# Patient Record
Sex: Male | Born: 1955 | Race: Black or African American | Hispanic: No | Marital: Married | Smoking: Never smoker
Health system: Southern US, Community
[De-identification: ages and names within clinical notes are randomized; demographics above are authoritative.]

## PROBLEM LIST (undated history)

## (undated) DIAGNOSIS — I10 Essential (primary) hypertension: Secondary | ICD-10-CM

## (undated) DIAGNOSIS — J45909 Unspecified asthma, uncomplicated: Secondary | ICD-10-CM

## (undated) DIAGNOSIS — C801 Malignant (primary) neoplasm, unspecified: Secondary | ICD-10-CM

## (undated) DIAGNOSIS — Z973 Presence of spectacles and contact lenses: Secondary | ICD-10-CM

## (undated) DIAGNOSIS — M2021 Hallux rigidus, right foot: Secondary | ICD-10-CM

## (undated) DIAGNOSIS — M1990 Unspecified osteoarthritis, unspecified site: Secondary | ICD-10-CM

## (undated) HISTORY — PX: KNEE SURGERY: SHX244

---

## 1998-05-27 ENCOUNTER — Emergency Department (HOSPITAL_COMMUNITY): Admission: EM | Admit: 1998-05-27 | Discharge: 1998-05-27 | Payer: Self-pay | Admitting: Emergency Medicine

## 2004-06-08 ENCOUNTER — Ambulatory Visit: Payer: Self-pay | Admitting: Internal Medicine

## 2004-11-25 ENCOUNTER — Ambulatory Visit (HOSPITAL_BASED_OUTPATIENT_CLINIC_OR_DEPARTMENT_OTHER): Admission: RE | Admit: 2004-11-25 | Discharge: 2004-11-25 | Payer: Self-pay | Admitting: Specialist

## 2004-11-25 ENCOUNTER — Ambulatory Visit (HOSPITAL_COMMUNITY): Admission: RE | Admit: 2004-11-25 | Discharge: 2004-11-25 | Payer: Self-pay | Admitting: Specialist

## 2005-11-07 ENCOUNTER — Emergency Department (HOSPITAL_COMMUNITY): Admission: EM | Admit: 2005-11-07 | Discharge: 2005-11-07 | Payer: Self-pay | Admitting: Emergency Medicine

## 2006-09-20 ENCOUNTER — Ambulatory Visit: Payer: Self-pay | Admitting: Gastroenterology

## 2006-11-01 ENCOUNTER — Ambulatory Visit: Payer: Self-pay | Admitting: Internal Medicine

## 2006-11-01 LAB — CONVERTED CEMR LAB
ALT: 32 units/L (ref 0–40)
AST: 29 units/L (ref 0–37)
BUN: 9 mg/dL (ref 6–23)
Bilirubin Urine: NEGATIVE
CO2: 28 meq/L (ref 19–32)
Calcium: 9.2 mg/dL (ref 8.4–10.5)
Cholesterol: 147 mg/dL (ref 0–200)
Creatinine, Ser: 1.2 mg/dL (ref 0.4–1.5)
Direct LDL: 68.8 mg/dL
HCT: 43.4 % (ref 39.0–52.0)
Ketones, ur: NEGATIVE mg/dL
MCHC: 34.2 g/dL (ref 30.0–36.0)
MCV: 88.8 fL (ref 78.0–100.0)
Monocytes Absolute: 0.4 10*3/uL (ref 0.2–0.7)
Monocytes Relative: 6.9 % (ref 3.0–11.0)
PSA: 0.89 ng/mL (ref 0.10–4.00)
RBC: 4.88 M/uL (ref 4.22–5.81)
Sodium: 140 meq/L (ref 135–145)
Specific Gravity, Urine: >=1.03 (ref 1.000–1.03)
Total CHOL/HDL Ratio: 5.6
Total Protein: 7 g/dL (ref 6.0–8.3)
Triglycerides: 246 mg/dL (ref 0–149)
Urobilinogen, UA: 0.2 (ref 0.0–1.0)
WBC: 6.4 10*3/uL (ref 4.5–10.5)

## 2007-05-19 ENCOUNTER — Ambulatory Visit: Payer: Self-pay | Admitting: Internal Medicine

## 2007-05-19 DIAGNOSIS — F3289 Other specified depressive episodes: Secondary | ICD-10-CM | POA: Insufficient documentation

## 2007-05-19 DIAGNOSIS — J45909 Unspecified asthma, uncomplicated: Secondary | ICD-10-CM | POA: Insufficient documentation

## 2007-05-19 DIAGNOSIS — J309 Allergic rhinitis, unspecified: Secondary | ICD-10-CM | POA: Insufficient documentation

## 2007-05-19 DIAGNOSIS — F329 Major depressive disorder, single episode, unspecified: Secondary | ICD-10-CM | POA: Insufficient documentation

## 2007-05-19 DIAGNOSIS — M25579 Pain in unspecified ankle and joints of unspecified foot: Secondary | ICD-10-CM | POA: Insufficient documentation

## 2010-10-09 NOTE — Op Note (Signed)
NAMEJenny Ray         ACCOUNT NO.:  [id number]   MEDICAL RECORD NO.:  [id number]          PATIENT TYPE:  AMB   LOCATION:  [facility]                         FACILITY:  [facility]   PHYSICIAN:  Jene Every, M.D.    DATE OF BIRTH:  1955-07-24   DATE OF PROCEDURE:  11/25/2004  DATE OF DISCHARGE:                                 OPERATIVE REPORT   PREOPERATIVE DIAGNOSIS:  Medial meniscus tear of the right knee.   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   PROCEDURE PERFORMED:  Right knee arthroscopy, posterior medial meniscectomy,  chondroplasty of the medial femoral condyle, medial tibial plateau, femoral  sulcus, patella, partial medial meniscectomy, evacuation of loose bodies.   ANESTHESIA:  General.   ASSISTANT:  None.   BRIEF HISTORY/INDICATIONS:  This is a 55 year old with knee pain and  refractory pain and swelling with MRI indicating the medial meniscus tear,  degenerative changes of the medial and patellofemoral compartments.  Operative intervention is indicated for debridement and partial medial  meniscectomy to the stable base.   Risks and benefits were discussed, including bleeding, infection, damage to  vascular structures, no changes in symptoms, worsening symptoms, need for  repeat debridement, etc.   TECHNIQUE:  With the patient in the supine position, after the induction of  adequate general anesthesia and 1 gm of Kefzol, the right knee and lower  extremity is prepped and draped in the usual sterile fashion.  A lateral  parapatellar portal and superomedial parapatellar portal was fashioned with  a #11 blade.  Then the __________ cannulated.  Irrigant was utilized to  insufflate the joint.  Camera inserted laterally.  Examination of the  medial  compartment revealed extensive chondral flap tears of the medial femoral  condyle.  A small grade 4 lesion of the tibial plateau laterally underneath  the meniscus.  A complex tear of the posterior third of the medial meniscus.  Unstable to probe/palpation.  Under direct visualization, the medial  parapatellar portal was fashioned with a #11 blade after localization with  an 18 gauge needle, sparing the medial meniscus.  A basket rongeur was  introduced and utilized to perform a partial medial meniscectomy with a  stable base, which was further contoured with a 4.2 coude shaver.  Chondroplasty at the femoral condyle and tibial plateau were performed.  I  evacuated loose bodies from the medial compartment.  A small grade 4 lesion  was approximately 4 mm in length and underneath the meniscus.  It was felt  best to leave this intact.  ACL and PCL were unremarkable.  There was  extensive grade 3 changes of the sulcus.  Some minor changes of the inferior  pole of the patella.  Chondroplasties were performed here as well.  The  lateral compartment was relatively spared with grade 2 changes in the  femoral condyle and tibial plateau.  The meniscus was stable to probe  palpation without evidence of  tear.  Again, ACL and PCL were without  evidence of tear.  The knee was copiously lavaged and examined the gutters.  All loose cartilaginous debris was removed.  Revised the medial meniscus.  No displaced fragment noted.   Next, all instrumentation was removed.  Portals were closed with 4-0 nylon  simple sutures.  Marcaine 0.25% with epinephrine was infiltrated into the  joint.  The wound was dressed sterilely.  He was awakened without difficulty  and transported to the recovery room in satisfactory condition.   The patient tolerated the procedure well with no complications.       JB/MEDQ  D:  11/25/2004  T:  11/25/2004  Job:  [id number]

## 2013-04-13 ENCOUNTER — Ambulatory Visit: Payer: Self-pay | Admitting: Internal Medicine

## 2014-11-05 ENCOUNTER — Encounter (HOSPITAL_COMMUNITY): Payer: Self-pay

## 2014-11-05 ENCOUNTER — Emergency Department (HOSPITAL_COMMUNITY)
Admission: EM | Admit: 2014-11-05 | Discharge: 2014-11-05 | Disposition: A | Discharge status: Home / Self Care | Payer: BLUE CROSS/BLUE SHIELD | Attending: Emergency Medicine | Admitting: Emergency Medicine

## 2014-11-05 ENCOUNTER — Emergency Department (HOSPITAL_COMMUNITY): Payer: BLUE CROSS/BLUE SHIELD

## 2014-11-05 DIAGNOSIS — Y92524 Gas station as the place of occurrence of the external cause: Secondary | ICD-10-CM | POA: Diagnosis not present

## 2014-11-05 DIAGNOSIS — Y9355 Activity, bike riding: Secondary | ICD-10-CM | POA: Insufficient documentation

## 2014-11-05 DIAGNOSIS — S99911A Unspecified injury of right ankle, initial encounter: Secondary | ICD-10-CM | POA: Diagnosis present

## 2014-11-05 DIAGNOSIS — S9304XA Dislocation of right ankle joint, initial encounter: Secondary | ICD-10-CM

## 2014-11-05 DIAGNOSIS — Y998 Other external cause status: Secondary | ICD-10-CM | POA: Diagnosis not present

## 2014-11-05 DIAGNOSIS — S82841A Displaced bimalleolar fracture of right lower leg, initial encounter for closed fracture: Secondary | ICD-10-CM | POA: Insufficient documentation

## 2014-11-05 DIAGNOSIS — S9301XA Subluxation of right ankle joint, initial encounter: Secondary | ICD-10-CM | POA: Insufficient documentation

## 2014-11-05 MED ORDER — OXYCODONE-ACETAMINOPHEN 5-325 MG PO TABS: 1.0000 | ORAL_TABLET | ORAL | Status: DC | PRN

## 2014-11-05 MED ORDER — ETOMIDATE 2 MG/ML IV SOLN
15.0000 mg | Freq: Once | INTRAVENOUS | Status: AC
  Administered 2014-11-05: 15 mg via INTRAVENOUS
  Filled 2014-11-05: qty 10

## 2014-11-05 MED ORDER — HYDROMORPHONE HCL 1 MG/ML IJ SOLN
INTRAMUSCULAR | Status: AC
  Filled 2014-11-05: qty 1

## 2014-11-05 MED ORDER — HYDROMORPHONE HCL 1 MG/ML IJ SOLN
1.0000 mg | Freq: Once | INTRAMUSCULAR | Status: AC
  Administered 2014-11-05: 1 mg via INTRAVENOUS

## 2014-11-05 NOTE — ED Notes (Signed)
Patient transported to X-ray 
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
## 2014-11-05 NOTE — ED Notes (Signed)
Questions r/t dc denied. Pt will transported via wheel chair. Pt a&ox4
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
## 2014-11-05 NOTE — ED Notes (Signed)
Conscious sedation completed on pt.

## 2014-11-05 NOTE — ED Notes (Signed)
MD at bedside. 
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
## 2014-11-05 NOTE — Discharge Instructions (Signed)
Return here as needed.  Follow-up with Dr. Avel Peace tomorrow by going to his office for a 2 PM appointment.  He said to arrive 15 minutes early
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
## 2014-11-06 ENCOUNTER — Telehealth (HOSPITAL_BASED_OUTPATIENT_CLINIC_OR_DEPARTMENT_OTHER): Payer: Self-pay | Admitting: Emergency Medicine

## 2014-11-06 ENCOUNTER — Ambulatory Visit: Payer: Self-pay | Admitting: Orthopedic Surgery

## 2014-11-06 MED ORDER — DEXTROSE 5 % IV SOLN
2.0000 g | INTRAVENOUS | Status: AC
  Administered 2014-11-08: 2 g via INTRAVENOUS

## 2014-11-07 ENCOUNTER — Encounter (HOSPITAL_COMMUNITY): Payer: Self-pay | Admitting: *Deleted

## 2014-11-07 DIAGNOSIS — Y92410 Unspecified street and highway as the place of occurrence of the external cause: Secondary | ICD-10-CM | POA: Diagnosis not present

## 2014-11-07 DIAGNOSIS — S82841A Displaced bimalleolar fracture of right lower leg, initial encounter for closed fracture: Secondary | ICD-10-CM | POA: Diagnosis present

## 2014-11-08 ENCOUNTER — Encounter (HOSPITAL_COMMUNITY)
Admission: RE | Disposition: A | Discharge status: Home / Self Care | Payer: Self-pay | Source: Ambulatory Visit | Attending: Specialist

## 2014-11-08 ENCOUNTER — Ambulatory Visit: Payer: Self-pay | Admitting: Orthopedic Surgery

## 2014-11-08 ENCOUNTER — Ambulatory Visit (HOSPITAL_COMMUNITY): Payer: BLUE CROSS/BLUE SHIELD | Admitting: Anesthesiology

## 2014-11-08 ENCOUNTER — Ambulatory Visit (HOSPITAL_COMMUNITY)
Admission: RE | Admit: 2014-11-08 | Discharge: 2014-11-09 | Disposition: A | Discharge status: Home / Self Care | Payer: BLUE CROSS/BLUE SHIELD | Source: Ambulatory Visit | Attending: Specialist | Admitting: Specialist

## 2014-11-08 ENCOUNTER — Ambulatory Visit (HOSPITAL_COMMUNITY): Payer: BLUE CROSS/BLUE SHIELD

## 2014-11-08 ENCOUNTER — Encounter (HOSPITAL_COMMUNITY): Payer: Self-pay | Admitting: *Deleted

## 2014-11-08 DIAGNOSIS — Y92410 Unspecified street and highway as the place of occurrence of the external cause: Secondary | ICD-10-CM | POA: Insufficient documentation

## 2014-11-08 DIAGNOSIS — S82843A Displaced bimalleolar fracture of unspecified lower leg, initial encounter for closed fracture: Secondary | ICD-10-CM | POA: Diagnosis present

## 2014-11-08 DIAGNOSIS — S82841A Displaced bimalleolar fracture of right lower leg, initial encounter for closed fracture: Secondary | ICD-10-CM | POA: Diagnosis not present

## 2014-11-08 DIAGNOSIS — S82899B Other fracture of unspecified lower leg, initial encounter for open fracture type I or II: Secondary | ICD-10-CM

## 2014-11-08 DIAGNOSIS — Z9889 Other specified postprocedural states: Secondary | ICD-10-CM

## 2014-11-08 DIAGNOSIS — Z8781 Personal history of (healed) traumatic fracture: Secondary | ICD-10-CM

## 2014-11-08 HISTORY — DX: Unspecified asthma, uncomplicated: J45.909

## 2014-11-08 HISTORY — PX: ORIF ANKLE FRACTURE: SHX5408

## 2014-11-08 LAB — CBC
HCT: 37.9 % — ABNORMAL LOW (ref 39.0–52.0)
HEMATOCRIT: 39.2 % (ref 39.0–52.0)
HEMOGLOBIN: 13.2 g/dL (ref 13.0–17.0)
HEMOGLOBIN: 12.7 g/dL — AB (ref 13.0–17.0)
MCH: 30.1 pg (ref 26.0–34.0)
MCH: 30.2 pg (ref 26.0–34.0)
MCHC: 33.7 g/dL (ref 30.0–36.0)
MCHC: 33.5 g/dL (ref 30.0–36.0)
MCV: 89.5 fL (ref 78.0–100.0)
MCV: 90.2 fL (ref 78.0–100.0)
Platelets: 214 10*3/uL (ref 150–400)
Platelets: 195 10*3/uL (ref 150–400)
RBC: 4.38 MIL/uL (ref 4.22–5.81)
RBC: 4.2 MIL/uL — ABNORMAL LOW (ref 4.22–5.81)
RDW: 13.6 % (ref 11.5–15.5)
RDW: 13.7 % (ref 11.5–15.5)
WBC: 6.2 10*3/uL (ref 4.0–10.5)
WBC: 8.1 10*3/uL (ref 4.0–10.5)

## 2014-11-08 LAB — CREATININE, SERUM
CREATININE: 1.31 mg/dL — AB (ref 0.61–1.24)
GFR calc Af Amer: >60 mL/min (ref >60)
GFR, EST NON AFRICAN AMERICAN: 59 mL/min — AB (ref >60)

## 2014-11-08 SURGERY — OPEN REDUCTION INTERNAL FIXATION (ORIF) ANKLE FRACTURE
Anesthesia: General | Site: Ankle | Laterality: Right

## 2014-11-08 MED ORDER — DOCUSATE SODIUM 100 MG PO CAPS
100.0000 mg | ORAL_CAPSULE | Freq: Two times a day (BID) | ORAL | Status: DC
  Administered 2014-11-09: 100 mg via ORAL

## 2014-11-08 MED ORDER — PROPOFOL 10 MG/ML IV BOLUS
INTRAVENOUS | Status: DC | PRN
  Administered 2014-11-08: 180 mg via INTRAVENOUS

## 2014-11-08 MED ORDER — CEFAZOLIN SODIUM-DEXTROSE 2-3 GM-% IV SOLR
INTRAVENOUS | Status: AC
  Filled 2014-11-08: qty 50

## 2014-11-08 MED ORDER — HYDROMORPHONE HCL 1 MG/ML IJ SOLN
INTRAMUSCULAR | Status: AC
  Filled 2014-11-08: qty 1

## 2014-11-08 MED ORDER — ACETAMINOPHEN 10 MG/ML IV SOLN: 1000.0000 mg | Freq: Once | INTRAVENOUS | Status: DC

## 2014-11-08 MED ORDER — CEFAZOLIN SODIUM-DEXTROSE 2-3 GM-% IV SOLR: 2.0000 g | INTRAVENOUS | Status: DC

## 2014-11-08 MED ORDER — MEPERIDINE HCL 50 MG/ML IJ SOLN: 6.2500 mg | INTRAMUSCULAR | Status: DC | PRN

## 2014-11-08 MED ORDER — LACTATED RINGERS IV SOLN
INTRAVENOUS | Status: DC
  Administered 2014-11-08: 1000 mL via INTRAVENOUS

## 2014-11-08 MED ORDER — ONDANSETRON HCL 4 MG/2ML IJ SOLN
INTRAMUSCULAR | Status: DC | PRN
  Administered 2014-11-08: 4 mg via INTRAVENOUS

## 2014-11-08 MED ORDER — ENOXAPARIN SODIUM 40 MG/0.4ML ~~LOC~~ SOLN
40.0000 mg | SUBCUTANEOUS | Status: DC
  Administered 2014-11-09: 40 mg via SUBCUTANEOUS
  Filled 2014-11-08 (×2): qty 0.4

## 2014-11-08 MED ORDER — LIDOCAINE HCL (CARDIAC) 20 MG/ML IV SOLN
INTRAVENOUS | Status: DC | PRN
  Administered 2014-11-08: 50 mg via INTRAVENOUS

## 2014-11-08 MED ORDER — DOCUSATE SODIUM 100 MG PO CAPS: 100.0000 mg | ORAL_CAPSULE | Freq: Two times a day (BID) | ORAL | Status: DC | PRN

## 2014-11-08 MED ORDER — SENNOSIDES-DOCUSATE SODIUM 8.6-50 MG PO TABS: 1.0000 | ORAL_TABLET | Freq: Every evening | ORAL | Status: DC | PRN

## 2014-11-08 MED ORDER — METHOCARBAMOL 500 MG PO TABS: 500.0000 mg | ORAL_TABLET | Freq: Four times a day (QID) | ORAL | Status: DC | PRN

## 2014-11-08 MED ORDER — BUPIVACAINE-EPINEPHRINE (PF) 0.5% -1:200000 IJ SOLN
INTRAMUSCULAR | Status: AC
  Filled 2014-11-08: qty 60

## 2014-11-08 MED ORDER — KETOROLAC TROMETHAMINE 30 MG/ML IJ SOLN
INTRAMUSCULAR | Status: AC
  Filled 2014-11-08: qty 1

## 2014-11-08 MED ORDER — RISAQUAD PO CAPS
1.0000 | ORAL_CAPSULE | Freq: Every day | ORAL | Status: DC
  Administered 2014-11-09: 1 via ORAL
  Filled 2014-11-08 (×2): qty 1

## 2014-11-08 MED ORDER — MIDAZOLAM HCL 5 MG/5ML IJ SOLN
INTRAMUSCULAR | Status: DC | PRN
  Administered 2014-11-08: 2 mg via INTRAVENOUS

## 2014-11-08 MED ORDER — FENTANYL CITRATE (PF) 100 MCG/2ML IJ SOLN
INTRAMUSCULAR | Status: AC
  Filled 2014-11-08: qty 2

## 2014-11-08 MED ORDER — BISACODYL 5 MG PO TBEC: 5.0000 mg | DELAYED_RELEASE_TABLET | Freq: Every day | ORAL | Status: DC | PRN

## 2014-11-08 MED ORDER — POTASSIUM CHLORIDE IN NACL 20-0.45 MEQ/L-% IV SOLN
INTRAVENOUS | Status: DC
  Administered 2014-11-08: 21:00:00 via INTRAVENOUS
  Filled 2014-11-08 (×2): qty 1000

## 2014-11-08 MED ORDER — SODIUM CHLORIDE 0.9 % IR SOLN
Status: DC | PRN
  Administered 2014-11-08: 500 mL

## 2014-11-08 MED ORDER — KETOROLAC TROMETHAMINE 10 MG PO TABS: 10.0000 mg | ORAL_TABLET | Freq: Four times a day (QID) | ORAL | Status: DC | PRN

## 2014-11-08 MED ORDER — HYDROMORPHONE HCL 1 MG/ML IJ SOLN
INTRAMUSCULAR | Status: AC
Start: 2014-11-08 — End: 2014-11-09
  Filled 2014-11-08: qty 1

## 2014-11-08 MED ORDER — ACETAMINOPHEN 325 MG PO TABS: 650.0000 mg | ORAL_TABLET | Freq: Four times a day (QID) | ORAL | Status: DC | PRN

## 2014-11-08 MED ORDER — MAGNESIUM CITRATE PO SOLN: 1.0000 | Freq: Once | ORAL | Status: AC | PRN

## 2014-11-08 MED ORDER — ONDANSETRON HCL 4 MG/2ML IJ SOLN: 4.0000 mg | Freq: Four times a day (QID) | INTRAMUSCULAR | Status: DC | PRN

## 2014-11-08 MED ORDER — ACETAMINOPHEN 10 MG/ML IV SOLN
INTRAVENOUS | Status: AC
  Filled 2014-11-08: qty 100

## 2014-11-08 MED ORDER — BUPIVACAINE-EPINEPHRINE (PF) 0.5% -1:200000 IJ SOLN
INTRAMUSCULAR | Status: DC | PRN
  Administered 2014-11-08: 4 mL

## 2014-11-08 MED ORDER — LIDOCAINE HCL (CARDIAC) 20 MG/ML IV SOLN
INTRAVENOUS | Status: AC
  Filled 2014-11-08: qty 5

## 2014-11-08 MED ORDER — PHENYLEPHRINE 40 MCG/ML (10ML) SYRINGE FOR IV PUSH (FOR BLOOD PRESSURE SUPPORT)
PREFILLED_SYRINGE | INTRAVENOUS | Status: AC
  Filled 2014-11-08: qty 10

## 2014-11-08 MED ORDER — ACETAMINOPHEN 650 MG RE SUPP: 650.0000 mg | Freq: Four times a day (QID) | RECTAL | Status: DC | PRN

## 2014-11-08 MED ORDER — BUPIVACAINE-EPINEPHRINE (PF) 0.5% -1:200000 IJ SOLN
INTRAMUSCULAR | Status: DC | PRN
  Administered 2014-11-08: 10 mL
  Administered 2014-11-08: 35 mL via PERINEURAL

## 2014-11-08 MED ORDER — METOCLOPRAMIDE HCL 5 MG/ML IJ SOLN: 5.0000 mg | Freq: Three times a day (TID) | INTRAMUSCULAR | Status: DC | PRN

## 2014-11-08 MED ORDER — BUPIVACAINE-EPINEPHRINE 0.5% -1:200000 IJ SOLN
INTRAMUSCULAR | Status: AC
  Filled 2014-11-08: qty 1

## 2014-11-08 MED ORDER — PROMETHAZINE HCL 25 MG/ML IJ SOLN: 6.2500 mg | INTRAMUSCULAR | Status: DC | PRN

## 2014-11-08 MED ORDER — FENTANYL CITRATE (PF) 100 MCG/2ML IJ SOLN
INTRAMUSCULAR | Status: DC | PRN
  Administered 2014-11-08: 100 ug via INTRAVENOUS
  Administered 2014-11-08: 25 ug via INTRAVENOUS
  Administered 2014-11-08: 50 ug via INTRAVENOUS
  Administered 2014-11-08: 25 ug via INTRAVENOUS

## 2014-11-08 MED ORDER — HYDROMORPHONE HCL 1 MG/ML IJ SOLN: 1.0000 mg | INTRAMUSCULAR | Status: DC | PRN

## 2014-11-08 MED ORDER — CEFAZOLIN SODIUM-DEXTROSE 2-3 GM-% IV SOLR
2.0000 g | Freq: Four times a day (QID) | INTRAVENOUS | Status: AC
  Administered 2014-11-08 – 2014-11-09 (×3): 2 g via INTRAVENOUS
  Filled 2014-11-08 (×3): qty 50

## 2014-11-08 MED ORDER — KETOROLAC TROMETHAMINE 15 MG/ML IJ SOLN
15.0000 mg | Freq: Four times a day (QID) | INTRAMUSCULAR | Status: DC
  Administered 2014-11-08 – 2014-11-09 (×2): 15 mg via INTRAVENOUS
  Filled 2014-11-08 (×5): qty 1

## 2014-11-08 MED ORDER — OXYCODONE HCL 5 MG PO TABS: 5.0000 mg | ORAL_TABLET | ORAL | Status: DC | PRN

## 2014-11-08 MED ORDER — MIDAZOLAM HCL 2 MG/2ML IJ SOLN: 0.5000 mg | Freq: Once | INTRAMUSCULAR | Status: DC | PRN

## 2014-11-08 MED ORDER — METHOCARBAMOL 1000 MG/10ML IJ SOLN
500.0000 mg | Freq: Four times a day (QID) | INTRAMUSCULAR | Status: DC | PRN
  Administered 2014-11-08: 500 mg via INTRAVENOUS
  Filled 2014-11-08 (×2): qty 5

## 2014-11-08 MED ORDER — BUPIVACAINE-EPINEPHRINE (PF) 0.5% -1:200000 IJ SOLN
INTRAMUSCULAR | Status: AC
  Filled 2014-11-08: qty 30

## 2014-11-08 MED ORDER — PROPOFOL 10 MG/ML IV BOLUS
INTRAVENOUS | Status: AC
  Filled 2014-11-08: qty 20

## 2014-11-08 MED ORDER — METOCLOPRAMIDE HCL 10 MG PO TABS: 5.0000 mg | ORAL_TABLET | Freq: Three times a day (TID) | ORAL | Status: DC | PRN

## 2014-11-08 MED ORDER — HYDROMORPHONE HCL 1 MG/ML IJ SOLN
0.2500 mg | INTRAMUSCULAR | Status: DC | PRN
  Administered 2014-11-08: 0.25 mg via INTRAVENOUS
  Administered 2014-11-08 (×2): 0.5 mg via INTRAVENOUS
  Administered 2014-11-08: 0.25 mg via INTRAVENOUS
  Administered 2014-11-08: 0.5 mg via INTRAVENOUS

## 2014-11-08 MED ORDER — ONDANSETRON HCL 4 MG/2ML IJ SOLN
INTRAMUSCULAR | Status: AC
  Filled 2014-11-08: qty 2

## 2014-11-08 MED ORDER — MIDAZOLAM HCL 2 MG/2ML IJ SOLN
INTRAMUSCULAR | Status: AC
  Filled 2014-11-08: qty 2

## 2014-11-08 MED ORDER — PHENYLEPHRINE HCL 10 MG/ML IJ SOLN
INTRAMUSCULAR | Status: DC | PRN
  Administered 2014-11-08: 40 ug via INTRAVENOUS

## 2014-11-08 MED ORDER — ONDANSETRON HCL 4 MG PO TABS: 4.0000 mg | ORAL_TABLET | Freq: Four times a day (QID) | ORAL | Status: DC | PRN

## 2014-11-08 MED ORDER — OXYCODONE-ACETAMINOPHEN 5-325 MG PO TABS: 1.0000 | ORAL_TABLET | ORAL | Status: DC | PRN

## 2014-11-08 MED ORDER — SODIUM CHLORIDE 0.9 % IR SOLN
Status: AC
  Filled 2014-11-08: qty 1

## 2014-11-08 MED ORDER — KETOROLAC TROMETHAMINE 30 MG/ML IJ SOLN
30.0000 mg | Freq: Four times a day (QID) | INTRAMUSCULAR | Status: DC | PRN
  Administered 2014-11-08: 30 mg via INTRAVENOUS

## 2014-11-08 MED ORDER — METHOCARBAMOL 500 MG PO TABS: 500.0000 mg | ORAL_TABLET | Freq: Three times a day (TID) | ORAL | Status: DC

## 2014-11-08 SURGICAL SUPPLY — 70 items
BAG ZIPLOCK 12X15 (MISCELLANEOUS) ×2 IMPLANT
BANDAGE ELASTIC 4 VELCRO ST LF (GAUZE/BANDAGES/DRESSINGS) IMPLANT
BANDAGE ELASTIC 6 VELCRO ST LF (GAUZE/BANDAGES/DRESSINGS) IMPLANT
BANDAGE ESMARK 6X9 LF (GAUZE/BANDAGES/DRESSINGS) IMPLANT
BIT DRILL 2.5X2.75 QC CALB (BIT) ×2 IMPLANT
BIT DRILL 2.9 CANN QC NONSTRL (BIT) ×2 IMPLANT
BIT DRILL 3.5X5.5 QC CALB (BIT) ×2 IMPLANT
BNDG COHESIVE 6X5 TAN STRL LF (GAUZE/BANDAGES/DRESSINGS) ×2 IMPLANT
BNDG ESMARK 6X9 LF (GAUZE/BANDAGES/DRESSINGS)
BNDG GAUZE ELAST 4 BULKY (GAUZE/BANDAGES/DRESSINGS) ×2 IMPLANT
CHLORAPREP W/TINT 26ML (MISCELLANEOUS) IMPLANT
CLEANER TIP ELECTROSURG 2X2 (MISCELLANEOUS) ×2 IMPLANT
CLOTH 2% CHLOROHEXIDINE 3PK (PERSONAL CARE ITEMS) ×2 IMPLANT
COVER MAYO STAND STRL (DRAPES) IMPLANT
CUFF TOURN SGL QUICK 34 (TOURNIQUET CUFF) ×1
CUFF TRNQT CYL 34X4X40X1 (TOURNIQUET CUFF) ×1 IMPLANT
DRAPE C-ARM 42X120 X-RAY (DRAPES) ×4 IMPLANT
DRAPE OEC MINIVIEW 54X84 (DRAPES) IMPLANT
DRAPE ORTHO SPLIT 77X108 STRL (DRAPES) ×2
DRAPE POUCH INSTRU U-SHP 10X18 (DRAPES) IMPLANT
DRAPE SURG ORHT 6 SPLT 77X108 (DRAPES) ×2 IMPLANT
DRAPE U-SHAPE 47X51 STRL (DRAPES) ×2 IMPLANT
DRSG ADAPTIC 3X8 NADH LF (GAUZE/BANDAGES/DRESSINGS) ×2 IMPLANT
DRSG PAD ABDOMINAL 8X10 ST (GAUZE/BANDAGES/DRESSINGS) ×2 IMPLANT
DURAPREP 26ML APPLICATOR (WOUND CARE) ×2 IMPLANT
ELECT REM PT RETURN 9FT ADLT (ELECTROSURGICAL) ×2
ELECTRODE REM PT RTRN 9FT ADLT (ELECTROSURGICAL) ×1 IMPLANT
GAUZE SPONGE 4X4 12PLY STRL (GAUZE/BANDAGES/DRESSINGS) ×2 IMPLANT
GLOVE BIOGEL PI IND STRL 7.5 (GLOVE) ×1 IMPLANT
GLOVE BIOGEL PI IND STRL 8 (GLOVE) ×1 IMPLANT
GLOVE BIOGEL PI INDICATOR 7.5 (GLOVE) ×1
GLOVE BIOGEL PI INDICATOR 8 (GLOVE) ×1
GLOVE SURG SS PI 7.5 STRL IVOR (GLOVE) ×2 IMPLANT
GLOVE SURG SS PI 8.0 STRL IVOR (GLOVE) ×4 IMPLANT
GOWN STRL REUS W/TWL XL LVL3 (GOWN DISPOSABLE) ×4 IMPLANT
K-WIRE ACE 1.6X6 (WIRE) ×4
KIT BASIN OR (CUSTOM PROCEDURE TRAY) ×2 IMPLANT
KWIRE ACE 1.6X6 (WIRE) ×2 IMPLANT
MANIFOLD NEPTUNE II (INSTRUMENTS) ×2 IMPLANT
NEEDLE HYPO 22GX1.5 SAFETY (NEEDLE) ×2 IMPLANT
PACK TOTAL JOINT (CUSTOM PROCEDURE TRAY) ×2 IMPLANT
PAD CAST 4YDX4 CTTN HI CHSV (CAST SUPPLIES) ×2 IMPLANT
PADDING CAST ABS 4INX4YD NS (CAST SUPPLIES) ×2
PADDING CAST ABS COTTON 4X4 ST (CAST SUPPLIES) ×2 IMPLANT
PADDING CAST COTTON 4X4 STRL (CAST SUPPLIES) ×2
PADDING CAST SYN 6 (CAST SUPPLIES) ×1
PADDING CAST SYNTHETIC 4 (CAST SUPPLIES) ×1
PADDING CAST SYNTHETIC 4X4 STR (CAST SUPPLIES) ×1 IMPLANT
PADDING CAST SYNTHETIC 6X4 NS (CAST SUPPLIES) ×1 IMPLANT
PLATE LOCK 7H 92 BILAT FIB (Plate) ×2 IMPLANT
POSITIONER SURGICAL ARM (MISCELLANEOUS) IMPLANT
SCREW ACE CAN 4.0 44M (Screw) ×4 IMPLANT
SCREW CORT FT 32X3.5XNONLOCK (Screw) ×1 IMPLANT
SCREW CORTICAL 3.5MM  32MM (Screw) ×1 IMPLANT
SCREW LOCK CORT STAR 3.5X10 (Screw) ×2 IMPLANT
SCREW LOCK CORT STAR 3.5X12 (Screw) ×2 IMPLANT
SCREW LOCK CORT STAR 3.5X14 (Screw) ×2 IMPLANT
SCREW LOW PROFILE 18MMX3.5MM (Screw) ×2 IMPLANT
SCREW NON LOCKING LP 3.5 16MM (Screw) ×6 IMPLANT
SPLINT FIBERGLASS 4X30 (CAST SUPPLIES) ×4 IMPLANT
STAPLER VISISTAT (STAPLE) ×2 IMPLANT
STOCKINETTE 8 INCH (MISCELLANEOUS) ×2 IMPLANT
SUCTION FRAZIER 12FR DISP (SUCTIONS) IMPLANT
SUT ETHILON 4 0 PS 2 18 (SUTURE) ×4 IMPLANT
SUT VIC AB 0 CT1 27 (SUTURE) ×2
SUT VIC AB 0 CT1 27XBRD ANTBC (SUTURE) ×2 IMPLANT
SUT VIC AB 2-0 CT1 27 (SUTURE) ×5
SUT VIC AB 2-0 CT1 TAPERPNT 27 (SUTURE) ×5 IMPLANT
SYR CONTROL 10ML LL (SYRINGE) ×2 IMPLANT
TUBING CONNECTING 10 (TUBING) ×2 IMPLANT

## 2014-11-08 NOTE — Anesthesia Preprocedure Evaluation (Addendum)
Anesthesia Evaluation  Patient identified by MRN, date of birth, ID band Patient awake    Reviewed: Allergy & Precautions, NPO status , Patient's Chart, lab work & pertinent test results  History of Anesthesia Complications Negative for: history of anesthetic complications  Airway Mallampati: II  TM Distance: >3 FB Neck ROM: Full    Dental  (+) Dental Advisory Given   Pulmonary neg pulmonary ROS,  breath sounds clear to auscultation        Cardiovascular negative cardio ROS  Rhythm:Regular Rate:Normal     Neuro/Psych negative neurological ROS     GI/Hepatic negative GI ROS, Neg liver ROS,   Endo/Other  negative endocrine ROS  Renal/GU negative Renal ROS     Musculoskeletal negative musculoskeletal ROS (+)   Abdominal   Peds  Hematology negative hematology ROS (+)   Anesthesia Other Findings   Reproductive/Obstetrics                             Anesthesia Physical Anesthesia Plan  ASA: I  Anesthesia Plan: General   Post-op Pain Management:    Induction: Intravenous  Airway Management Planned: LMA  Additional Equipment:   Intra-op Plan:   Post-operative Plan:   Informed Consent: I have reviewed the patients History and Physical, chart, labs and discussed the procedure including the risks, benefits and alternatives for the proposed anesthesia with the patient or authorized representative who has indicated his/her understanding and acceptance.   Dental advisory given  Plan Discussed with: Surgeon and CRNA  Anesthesia Plan Comments: (Plan routine monitors, GA- LMAA OK, popliteal block for post op analgesia)        Anesthesia Quick Evaluation

## 2014-11-08 NOTE — Progress Notes (Signed)
AssistedDr. Jean Rosenthal with right, popliteal, saphenous block. Side rails up, monitors on throughout procedure. See vital signs in flow sheet. Tolerated Procedure well.

## 2014-11-08 NOTE — Discharge Instructions (Signed)
Remain non weightbearing right leg, use crutches, walker, knee walker for ambulation Ice and elevate right leg, toes above the nose, at least 5-6x/day, 20-30 minutes each to reduce swelling Keep cast clean and dry at all times Take aspirin  twice daily to prevent blood clots Follow up with Dr. Shelle Iron in office in 10-14 days

## 2014-11-08 NOTE — Interval H&P Note (Signed)
History and Physical Interval Note:  11/08/2014 1:01 PM  [employer]  has presented today for surgery, with the diagnosis of RIGHT ANKLE FRACTURE, DISLOCATION  The various methods of treatment have been discussed with the patient and family. After consideration of risks, benefits and other options for treatment, the patient has consented to  Procedure(s): OPEN REDUCTION INTERNAL FIXATION (ORIF) RIGHT ANKLE  (Right) as a surgical intervention .  The patient's history has been reviewed, patient examined, no change in status, stable for surgery.  I have reviewed the patient's chart and labs.  Questions were answered to the patient's satisfaction.     Vincent Ray C

## 2014-11-08 NOTE — Transfer of Care (Signed)
Immediate Anesthesia Transfer of Care Note  Patient: IT sales professional  Procedure(s) Performed: Procedure(s): OPEN REDUCTION INTERNAL FIXATION (ORIF) RIGHT ANKLE  (Right)  Patient Location: PACU  Anesthesia Type:General  Level of Consciousness:  sedated, patient cooperative and responds to stimulation  Airway & Oxygen Therapy:Patient Spontanous Breathing and Patient connected to face mask oxgen  Post-op Assessment:  Report given to PACU RN and Post -op Vital signs reviewed and stable  Post vital signs:  Reviewed and stable  Last Vitals:  Filed Vitals:   11/08/14 1448  BP:   Pulse: 63  Temp:   Resp: 9    Complications: No apparent anesthesia complications

## 2014-11-08 NOTE — Anesthesia Procedure Notes (Addendum)
Anesthesia Regional Block:  Popliteal block  Pre-Anesthetic Checklist: ,, timeout performed, Correct Patient, Correct Site, Correct Laterality, Correct Procedure, Correct Position, site marked, Risks and benefits discussed,  Surgical consent,  Pre-op evaluation,  At surgeon's request and post-op pain management  Laterality: Right and Lower  Prep: chloraprep       Needles:  Injection technique: Single-shot  Needle Type: Stimulator Needle - 80     Needle Length: 9cm 9 cm Needle Gauge: 22 and 22 G    Additional Needles:  Procedures: nerve stimulator Popliteal block  Nerve Stimulator or Paresthesia:  Response: toe dorsiflexion, 0.4 mA, 0.1 ms,  Response: toe abduction, 0.4 mA, 0.1 ms,   Additional Responses:   Narrative:  Start time: 11/08/2014 2:34 PM End time: 11/08/2014 2:40 PM Injection made incrementally with aspirations every 5 mL.  Performed by: Personally  Anesthesiologist: Jean Rosenthal, [place]  Additional Notes: Pt identified in Holding room.  Monitors applied. Working IV access confirmed. Sterile prep R lateral knee.  #22ga PNS to toe dorsiflexion and abduction twitches at 0.43mA threshold.  35cc 0.5% Bupivacaine with 1:200k epi injected incrementally after negative test dose. Reprep R ant-medial, prox tibia.  10cc 0.5% Bupivacaine with 1:200k epi injected subq for saphenous nerve supplementation. Patient asymptomatic, VSS, no heme aspirated, tolerated well.  Sandford Craze, MD   Procedure Name: LMA Insertion Date/Time: 11/08/2014 2:59 PM Performed by: Paris Lore Pre-anesthesia Checklist: Patient identified, Emergency Drugs available, Suction available, Patient being monitored and Timeout performed Patient Re-evaluated:Patient Re-evaluated prior to inductionOxygen Delivery Method: Circle system utilized Preoxygenation: Pre-oxygenation with 100% oxygen Intubation Type: IV induction Ventilation: Mask ventilation without difficulty LMA: LMA inserted LMA Size:  4.0 Number of attempts: 1 Placement Confirmation: positive ETCO2 and breath sounds checked- equal and bilateral Tube secured with: Tape

## 2014-11-08 NOTE — Anesthesia Postprocedure Evaluation (Signed)
  Anesthesia Post-op Note  Patient: IT sales professional  Procedure(s) Performed: Procedure(s): OPEN REDUCTION INTERNAL FIXATION (ORIF) RIGHT ANKLE  (Right)  Patient Location: PACU  Anesthesia Type:GA combined with regional for post-op pain  Level of Consciousness: awake, alert , oriented and patient cooperative  Airway and Oxygen Therapy: Patient Spontanous Breathing  Post-op Pain: mild  Post-op Assessment: Post-op Vital signs reviewed, Patient's Cardiovascular Status Stable, Respiratory Function Stable, Patent Airway, No signs of Nausea or vomiting and Pain level controlled     RLE Motor Response: Purposeful movement RLE Sensation: Full sensation, Numbness      Post-op Vital Signs: Reviewed and stable  Last Vitals:  Filed Vitals:   11/08/14 1830  BP: 130/86  Pulse: 68  Temp: 36.6 C  Resp: 14    Complications: No apparent anesthesia complications

## 2014-11-08 NOTE — Brief Op Note (Signed)
11/08/2014  4:40 PM  PATIENT:  Vincent Ray  59 y.o. male  PRE-OPERATIVE DIAGNOSIS:  RIGHT ANKLE FRACTURE, DISLOCATION  POST-OPERATIVE DIAGNOSIS:  RIGHT ANKLE FRACTURE, DISLOCATION  PROCEDURE:  Procedure(s): OPEN REDUCTION INTERNAL FIXATION (ORIF) RIGHT ANKLE  (Right)  SURGEON:  Surgeon(s) and Role:    * Jene Every, MD - Primary  PHYSICIAN ASSISTANT:   ASSISTANTS: Bissell   ANESTHESIA:   general  EBL:     BLOOD ADMINISTERED:none  DRAINS: none   LOCAL MEDICATIONS USED:  MARCAINE     SPECIMEN:  No Specimen  DISPOSITION OF SPECIMEN:  N/A  COUNTS:  YES  TOURNIQUET:  * No tourniquets in log *  DICTATION: .Other Dictation: Dictation Number [id number]  PLAN OF CARE: Admit for overnight observation  PATIENT DISPOSITION:  PACU - hemodynamically stable.   Delay start of Pharmacological VTE agent (>24hrs) due to surgical blood loss or risk of bleeding: no
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
## 2014-11-09 ENCOUNTER — Other Ambulatory Visit (HOSPITAL_COMMUNITY): Payer: Self-pay | Admitting: Physician Assistant

## 2014-11-09 DIAGNOSIS — S82841A Displaced bimalleolar fracture of right lower leg, initial encounter for closed fracture: Secondary | ICD-10-CM | POA: Diagnosis not present
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
## 2014-11-09 NOTE — Progress Notes (Signed)
Discharged from floor via w/c, spouse and belongings with pt. No changes in assessment. .Vincent Ray  

## 2014-11-09 NOTE — Progress Notes (Signed)
   Subjective: 1 Day Post-Op Procedure(s) (LRB): OPEN REDUCTION INTERNAL FIXATION (ORIF) RIGHT ANKLE  (Right)  Pt doing well Minimal pain to right ankle Denies any other symptoms or issues Patient reports pain as mild.  Objective:   VITALS:   Filed Vitals:   11/09/14 0505  BP: 108/60  Pulse: 72  Temp: 98.1 F (36.7 C)  Resp: 14    Right ankle currently in bivalved cast nv intact distally No edema  LABS  Recent Labs  11/08/14 1353 11/08/14 1945  HGB 13.2 12.7*  HCT 39.2 37.9*  WBC 6.2 8.1  PLT 214 195     Recent Labs  11/08/14 1945  CREATININE 1.31*     Assessment/Plan: 1 Day Post-Op Procedure(s) (LRB): OPEN REDUCTION INTERNAL FIXATION (ORIF) RIGHT ANKLE  (Right) D/c home today F/u in 2 weeks Non weight bearing right lower extremity    Alphonsa Overall, MPAS, PA-C  11/09/2014, 8:22 AM

## 2014-11-09 NOTE — Op Note (Signed)
NAMEJAICION, CAHN        ACCOUNT NO.:  [id number]  MEDICAL RECORD NO.:  [id number]  LOCATION:  1612                         FACILITY:  [facility]  PHYSICIAN:  Jene Every, M.D.    DATE OF BIRTH:  1955/07/23  DATE OF PROCEDURE:  11/08/2014 DATE OF DISCHARGE:                              OPERATIVE REPORT   PREOPERATIVE DIAGNOSIS:  Bimalleolar fracture dislocation of the ankle.  POSTOPERATIVE DIAGNOSIS:  Bimalleolar fracture dislocation of the ankle.  PROCEDURE PERFORMED:  Open reduction and internal fixation of bimalleolar ankle fracture.  ANESTHESIA:  General.  ASSISTANT:  Lanna Poche, PA.  HISTORY:  A 59 year old with fracture dislocation, seen in the emergency room, reduced.  On Monday, he was indicated for open reduction and internal fixation of the fracture with internal fixation.  Risks and benefits discussed including bleeding, infection, damage to the neurovascular structures, DVT, PE, anesthetic complications, nonunion, etc.  TECHNIQUE:  With the patient in a supine position, after the induction of adequate general anesthesia and then a peripheral peroneal block, the right lower extremity was prepped and draped in usual sterile fashion. We turned first towards fixing fibula, incision through the skin only. Blunt dissection down to the fibula with hematoma noted.  There was significant soft tissue disruption laterally.  We identified the fracture site, it was distal on the fibula.  Superficial peroneal nerve was actually posterior and ran across the distal part of the distal fibula.  It was protected throughout the case.  Fracture was reduced, we placed an interfragmentary screw from anterior to posterior to lag, over drilling it proximally, measured to 30.  Inserted the fully-threaded cortical screw with excellent purchase.  This was in the distal part of the fibula.  Then contoured a Biomet plate laterally.  Fixed it proximally with fully-threaded  cortical screws nonlocking and distally with 3 fully-threaded cortical screws locking at the appropriate drilling depth, gauge measurement, insertion of the screws with excellent purchase.  Excellent restoration of the fibula.  Mortise was intact.  Attention then turned medially, made a small incision over the distal medial malleolus, it was anatomically reduced, exposed the medial malleolus, placed 2 cannulated K-wires up through the medial malleolar fracture, over drilled it, and then placed 2 partially-threaded screws 44 mm in length with excellent purchase.  I removed the K-wires.  In the AP and lateral plane, there was anatomic reduction of the fractures and maintenance of the mortise.  Copiously irrigated both wounds medially, closed with 2-0 meticulously avoided the superficial branch of the peroneal nerve reducing it and interposing it and some fat posteriorly. It was intact.  Subcu with 2-0, and skin with staples.  Wound was dressed sterilely.  No tourniquet was utilized.  A short-leg cast well molded was placed after sterile dressing applied.  Fiberglass ankle in neutral.  The patient was then awoken without difficulty and transported to the recovery room in satisfactory condition.  The patient tolerated the procedure well.  No complications.  Assistant, Lanna Poche, [place].  Minimal blood loss.     Jene Every, M.D.     Cordelia Pen  D:  11/08/2014  T:  11/09/2014  Job:  [id number]

## 2014-11-09 NOTE — Evaluation (Signed)
Physical Therapy Evaluation Patient Details Name: Vincent Ray MRN: [id number] DOB: August 07, 1955 Today's Date: 11/09/2014   History of Present Illness  s/p ORIF R bimalleolar ankle fx  Clinical Impression  Patient evaluated by Physical Therapy with no further acute PT needs identified. All education has been completed and the patient has no further questions. See below for any follow-up Physial Therapy or equipment needs. PT is signing off. Thank you for this referral. Pt needs RW for home     Follow Up Recommendations No PT follow up    Equipment Recommendations  Rolling walker with 5" wheels    Recommendations for Other Services       Precautions / Restrictions Precautions Precautions: Fall Restrictions Weight Bearing Restrictions: Yes RLE Weight Bearing: Touchdown weight bearing Other Position/Activity Restrictions: TDWB      Mobility  Bed Mobility Overal bed mobility: Needs Assistance Bed Mobility: Supine to Sit     Supine to sit: Modified independent (Device/Increase time)        Transfers Overall transfer level: Needs assistance Equipment used: Rolling walker (2 wheeled) Transfers: Sit to/from Stand Sit to Stand: Supervision         General transfer comment: cues for hand placement  Ambulation/Gait Ambulation/Gait assistance: Supervision;Modified independent (Device/Increase time) Ambulation Distance (Feet): 120 Feet Assistive device: Rolling walker (2 wheeled)       General Gait Details: for safety, cues for RW technique  Stairs Stairs:  (pt declined practice stating he felt comfortable with technique with crutches from prior to surgery)          Wheelchair Mobility    Modified Rankin (Stroke Patients Only)       Balance                                             Pertinent Vitals/Pain Pain Assessment: No/denies pain    Home Living Family/patient expects to be discharged to:: Private residence Living  Arrangements: Spouse/significant other   Type of Home: House Home Access: Stairs to enter   Secretary/administrator of Steps: 1 Home Layout: One level Home Equipment: Crutches      Prior Function Level of Independence: Independent;Independent with assistive device(s)               Hand Dominance        Extremity/Trunk Assessment   Upper Extremity Assessment: Defer to OT evaluation           Lower Extremity Assessment: RLE deficits/detail RLE Deficits / Details: able to move toes, intact to light touch distal toes       Communication   Communication: No difficulties  Cognition Arousal/Alertness: Awake/alert Behavior During Therapy: WFL for tasks assessed/performed Overall Cognitive Status: Within Functional Limits for tasks assessed                      General Comments      Exercises        Assessment/Plan    PT Assessment Patent does not need any further PT services  PT Diagnosis Difficulty walking   PT Problem List    PT Treatment Interventions     PT Goals (Current goals can be found in the Care Plan section) Acute Rehab PT Goals Patient Stated Goal: home  PT Goal Formulation: All assessment and education complete, DC therapy    Frequency     Barriers to  discharge        Co-evaluation               End of Session Equipment Utilized During Treatment: Gait belt Activity Tolerance: Patient tolerated treatment well;No increased pain Patient left: in chair;with call bell/phone within reach;with family/visitor present Nurse Communication: Mobility status;Other (comment) (ready for D/C)    Functional Assessment Tool Used: clinical judgement Functional Limitation: Mobility: Walking and moving around Mobility: Walking and Moving Around Current Status [phone]): At least 1 percent but less than 20 percent impaired, limited or restricted Mobility: Walking and Moving Around Goal Status [phone]): At least 1 percent but less than 20  percent impaired, limited or restricted Mobility: Walking and Moving Around Discharge Status [phone]): At least 1 percent but less than 20 percent impaired, limited or restricted    Time: 0946-1005 PT Time Calculation (min) (ACUTE ONLY): 19 min   Charges:   PT Evaluation $Initial PT Evaluation Tier I: 1 Procedure     PT G Codes:   PT G-Codes **NOT FOR INPATIENT CLASS** Functional Assessment Tool Used: clinical judgement Functional Limitation: Mobility: Walking and moving around Mobility: Walking and Moving Around Current Status (B1478): At least 1 percent but less than 20 percent impaired, limited or restricted Mobility: Walking and Moving Around Goal Status [phone]): At least 1 percent but less than 20 percent impaired, limited or restricted Mobility: Walking and Moving Around Discharge Status [phone]): At least 1 percent but less than 20 percent impaired, limited or restricted    [facility] 11/09/2014, 10:07 AM

## 2014-11-11 ENCOUNTER — Encounter (HOSPITAL_COMMUNITY): Payer: Self-pay | Admitting: Specialist

## 2015-04-25 ENCOUNTER — Telehealth: Payer: Self-pay | Admitting: Internal Medicine

## 2015-04-25 NOTE — Telephone Encounter (Signed)
Ok with me 

## 2015-04-25 NOTE — Telephone Encounter (Signed)
Pt request to reestablish with Dr. Jonny Ruiz to get a physical, pt has BCBS for insurance. Please call spouse back  [employer] # [phone]

## 2015-05-01 NOTE — Telephone Encounter (Signed)
Left vm to call back to schedule  °

## 2015-05-28 ENCOUNTER — Encounter: Payer: Self-pay | Admitting: Internal Medicine

## 2015-05-28 ENCOUNTER — Ambulatory Visit (INDEPENDENT_AMBULATORY_CARE_PROVIDER_SITE_OTHER): Payer: BLUE CROSS/BLUE SHIELD | Admitting: Internal Medicine

## 2015-05-28 ENCOUNTER — Other Ambulatory Visit (INDEPENDENT_AMBULATORY_CARE_PROVIDER_SITE_OTHER): Payer: BLUE CROSS/BLUE SHIELD

## 2015-05-28 VITALS — BP 128/98 | HR 88 | Temp 98.0°F | Ht 75.0 in | Wt 224.0 lb

## 2015-05-28 DIAGNOSIS — R9431 Abnormal electrocardiogram [ECG] [EKG]: Secondary | ICD-10-CM | POA: Diagnosis not present

## 2015-05-28 DIAGNOSIS — Z0000 Encounter for general adult medical examination without abnormal findings: Secondary | ICD-10-CM

## 2015-05-28 DIAGNOSIS — Z23 Encounter for immunization: Secondary | ICD-10-CM | POA: Diagnosis not present

## 2015-05-28 LAB — HEPATIC FUNCTION PANEL
ALK PHOS: 67 U/L (ref 39–117)
ALT: 27 U/L (ref 0–53)
AST: 22 U/L (ref 0–37)
Albumin: 4.3 g/dL (ref 3.5–5.2)
BILIRUBIN DIRECT: 0.1 mg/dL (ref 0.0–0.3)
TOTAL PROTEIN: 7.3 g/dL (ref 6.0–8.3)
Total Bilirubin: 0.6 mg/dL (ref 0.2–1.2)

## 2015-05-28 LAB — CBC WITH DIFFERENTIAL/PLATELET
Basophils Absolute: 0.1 10*3/uL (ref 0.0–0.1)
Basophils Relative: 1 % (ref 0.0–3.0)
EOS PCT: 2.3 % (ref 0.0–5.0)
Eosinophils Absolute: 0.2 10*3/uL (ref 0.0–0.7)
HCT: 46.8 % (ref 39.0–52.0)
Hemoglobin: 15.6 g/dL (ref 13.0–17.0)
LYMPHS ABS: 2.6 10*3/uL (ref 0.7–4.0)
Lymphocytes Relative: 37.5 % (ref 12.0–46.0)
MCHC: 33.2 g/dL (ref 30.0–36.0)
MCV: 90.6 fl (ref 78.0–100.0)
MONO ABS: 0.5 10*3/uL (ref 0.1–1.0)
MONOS PCT: 7.7 % (ref 3.0–12.0)
NEUTROS PCT: 51.5 % (ref 43.0–77.0)
Neutro Abs: 3.5 10*3/uL (ref 1.4–7.7)
PLATELETS: 284 10*3/uL (ref 150.0–400.0)
RBC: 5.17 Mil/uL (ref 4.22–5.81)
RDW: 14.1 % (ref 11.5–15.5)
WBC: 6.9 10*3/uL (ref 4.0–10.5)

## 2015-05-28 LAB — LIPID PANEL
Cholesterol: 160 mg/dL (ref 0–200)
HDL: 36.6 mg/dL — ABNORMAL LOW (ref >39.00)
LDL CALC: 97 mg/dL (ref 0–99)
NonHDL: 123.35
Total CHOL/HDL Ratio: 4
Triglycerides: 131 mg/dL (ref 0.0–149.0)
VLDL: 26.2 mg/dL (ref 0.0–40.0)

## 2015-05-28 LAB — BASIC METABOLIC PANEL
BUN: 15 mg/dL (ref 6–23)
CO2: 28 meq/L (ref 19–32)
Calcium: 9.7 mg/dL (ref 8.4–10.5)
Chloride: 104 mEq/L (ref 96–112)
Creatinine, Ser: 1.2 mg/dL (ref 0.40–1.50)
GFR: 80.17 mL/min (ref >60.00)
GLUCOSE: 91 mg/dL (ref 70–99)
POTASSIUM: 4.1 meq/L (ref 3.5–5.1)
SODIUM: 140 meq/L (ref 135–145)

## 2015-05-28 LAB — URINALYSIS, ROUTINE W REFLEX MICROSCOPIC
Bilirubin Urine: NEGATIVE
Hgb urine dipstick: NEGATIVE
Ketones, ur: NEGATIVE
Leukocytes, UA: NEGATIVE
Nitrite: NEGATIVE
PH: 5.5 (ref 5.0–8.0)
RBC / HPF: NONE SEEN (ref 0–?)
Specific Gravity, Urine: 1.025 (ref 1.000–1.030)
Total Protein, Urine: NEGATIVE
Urine Glucose: NEGATIVE
Urobilinogen, UA: 0.2 (ref 0.0–1.0)

## 2015-05-28 LAB — TSH: TSH: 1.53 u[IU]/mL (ref 0.35–4.50)

## 2015-05-28 LAB — PSA: PSA: 1.59 ng/mL (ref 0.10–4.00)

## 2015-05-28 MED ORDER — HYDROCHLOROTHIAZIDE 12.5 MG PO CAPS: 12.5000 mg | ORAL_CAPSULE | Freq: Every day | ORAL | Status: DC

## 2015-05-28 NOTE — Assessment & Plan Note (Signed)
susepoct HTN heart disease, new dx, for hct 12.5 qd, also echo, consider card referral

## 2015-05-28 NOTE — Patient Instructions (Addendum)
You had the flu shot today, and the tetanus (Tdap) shots today  You will be contacted regarding the referral for: colonoscopy, and the Echocardiogram  Please take all new medication as prescribed - the HCT 12.5 mg daily  Please also start Aspirin 81 mg per day (enteric coated only)  Please continue all other medications as before, and refills have been done if requested.  Please have the pharmacy call with any other refills you may need.  Please continue your efforts at being more active, low cholesterol diet, and weight control.  You are otherwise up to date with prevention measures today.  Please keep your appointments with your specialists as you may have planned  Please go to the LAB in the Basement (turn left off the elevator) for the tests to be done today  You will be contacted by phone if any changes need to be made immediately.  Otherwise, you will receive a letter about your results with an explanation, but please check with MyChart first.  Please remember to sign up for MyChart if you have not done so, as this will be important to you in the future with finding out test results, communicating by private email, and scheduling acute appointments online when needed.  Please return in 3 months, or sooner if needed

## 2015-05-28 NOTE — Progress Notes (Signed)
Subjective:    Patient ID: Vincent Ray, male    DOB: Sep 03, 1955, 60 y.o.   MRN: [id number]  HPI  Here for wellness and f/u;  Overall doing ok;  Pt denies Chest pain, worsening SOB, DOE, wheezing, orthopnea, PND, worsening LE edema, palpitations, dizziness or syncope.  Pt denies neurological change such as new headache, facial or extremity weakness.  Pt denies polydipsia, polyuria, or low sugar symptoms. Pt states overall good compliance with treatment and medications, good tolerability, and has been trying to follow appropriate diet.  Pt denies worsening depressive symptoms, suicidal ideation or panic. No fever, night sweats, wt loss, loss of appetite, or other constitutional symptoms.  Pt states good ability with ADL's, has low fall risk, home safety reviewed and adequate, no other significant changes in hearing or vision, and only occasionally active with exercise.  Not aware of elev BP in the past. Past Medical History  Diagnosis Date  . Asthma     As a child no problems now   Past Surgical History  Procedure Laterality Date  . Knee surgery    . Orif ankle fracture Right 11/08/2014    Procedure: OPEN REDUCTION INTERNAL FIXATION (ORIF) RIGHT ANKLE ;  Surgeon: Jene Every, MD;  Location: [facility];  Service: Orthopedics;  Laterality: Right;    reports that he has never smoked. He has never used smokeless tobacco. He reports that he drinks alcohol. He reports that he does not use illicit drugs. family history includes Dementia in his mother. No Known Allergies Current Outpatient Prescriptions on File Prior to Visit  Medication Sig Dispense Refill  . docusate sodium (COLACE) 100 MG capsule Take 1 capsule (100 mg total) by mouth 2 (two) times daily as needed for mild constipation. (Patient not taking: Reported on 05/28/2015) 20 capsule 1  . ibuprofen (ADVIL,MOTRIN) 200 MG tablet Take 800 mg by mouth every 6 (six) hours as needed for headache or moderate pain. Reported on 05/28/2015    .  ketorolac (TORADOL) 10 MG tablet Take 1 tablet (10 mg total) by mouth every 6 (six) hours as needed. (Patient not taking: Reported on 05/28/2015) 20 tablet 0  . methocarbamol (ROBAXIN) 500 MG tablet Take 1 tablet (500 mg total) by mouth 3 (three) times daily. (Patient not taking: Reported on 05/28/2015) 40 tablet 1  . oxyCODONE-acetaminophen (PERCOCET) 5-325 MG per tablet Take 1 tablet by mouth every 4 (four) hours as needed. (Patient not taking: Reported on 05/28/2015) 60 tablet 0   No current facility-administered medications on file prior to visit.   Review of Systems Constitutional: Negative for increased diaphoresis, other activity, appetite or siginficant weight change other than noted HENT: Negative for worsening hearing loss, ear pain, facial swelling, mouth sores and neck stiffness.   Eyes: Negative for other worsening pain, redness or visual disturbance.  Respiratory: Negative for shortness of breath and wheezing  Cardiovascular: Negative for chest pain and palpitations.  Gastrointestinal: Negative for diarrhea, blood in stool, abdominal distention or other pain Genitourinary: Negative for hematuria, flank pain or change in urine volume.  Musculoskeletal: Negative for myalgias or other joint complaints.  Skin: Negative for color change and wound or drainage.  Neurological: Negative for syncope and numbness. other than noted Hematological: Negative for adenopathy. or other swelling Psychiatric/Behavioral: Negative for hallucinations, SI, self-injury, decreased concentration or other worsening agitation.      Objective:   Physical Exam BP 128/98 mmHg  Pulse 88  Temp(Src) 98 F (36.7 C) (Oral)  Ht  (1.905 m)  Wt 224 lb (101.606 kg)  BMI 28.00 kg/m2  SpO2 95% VS noted,  Constitutional: Pt is oriented to person, place, and time. Appears well-developed and well-nourished, in no significant distress Head: Normocephalic and atraumatic.  Right Ear: External ear normal.  Left Ear:  External ear normal.  Nose: Nose normal.  Mouth/Throat: Oropharynx is clear and moist.  Eyes: Conjunctivae and EOM are normal. Pupils are equal, round, and reactive to light.  Neck: Normal range of motion. Neck supple. No JVD present. No tracheal deviation present or significant neck LA or mass Cardiovascular: Normal rate, regular rhythm, normal heart sounds and intact distal pulses.   Pulmonary/Chest: Effort normal and breath sounds without rales or wheezing  Abdominal: Soft. Bowel sounds are normal. NT. No HSM  Musculoskeletal: Normal range of motion. Exhibits no edema.  Lymphadenopathy:  Has no cervical adenopathy.  Neurological: Pt is alert and oriented to person, place, and time. Pt has normal reflexes. No cranial nerve deficit. Motor grossly intact Skin: Skin is warm and dry. No rash noted.  Psychiatric:  Has normal mood and affect. Behavior is normal.   ECG:  SR with LVH    Assessment & Plan:

## 2015-05-28 NOTE — Progress Notes (Signed)
Pre visit review using our clinic review tool, if applicable. No additional management support is needed unless otherwise documented below in the visit note. 

## 2015-05-28 NOTE — Assessment & Plan Note (Signed)

## 2015-05-29 ENCOUNTER — Encounter: Payer: Self-pay | Admitting: Gastroenterology

## 2015-05-29 LAB — HEPATITIS C ANTIBODY: HCV AB: NEGATIVE

## 2015-06-13 ENCOUNTER — Telehealth: Payer: Self-pay | Admitting: *Deleted

## 2015-06-13 NOTE — Telephone Encounter (Signed)
Since echo is only for HTN and LVH on EKG and he does not have active cardiac symptoms or a cardiac history we can proceed with colonoscopy without echo and without cardiac evaluation.

## 2015-06-13 NOTE — Telephone Encounter (Signed)
Dr Russella Dar, This patient has a direct colon, no GI hx, scheduled with you 2-10 Friday and a PV 06-20-15. He has a hx of asthma and HTN.  At his PE with his PCP 05-28-2015 it's noted he had an abnormal EKG of SR with LVH.  Not states suspect HTN heart disease, new dx, echo, consider cards ref, start HCTZ 12.5 mg daily.  Echo order has been placed but not done. Attempted to call pt to see if he has a date but no answer and was not able to LM.  Do you want his heart issue resolved prior to his direct colon. Please advise   Thanks for your time.  Hilda Lias PV

## 2015-06-13 NOTE — Telephone Encounter (Signed)
Will proceed as scheduled. Vincent Ray  

## 2015-07-04 ENCOUNTER — Encounter: Payer: BLUE CROSS/BLUE SHIELD | Admitting: Gastroenterology

## 2015-08-07 ENCOUNTER — Ambulatory Visit (AMBULATORY_SURGERY_CENTER): Payer: Self-pay | Admitting: *Deleted

## 2015-08-07 VITALS — Ht 75.0 in | Wt 224.8 lb

## 2015-08-07 DIAGNOSIS — Z1211 Encounter for screening for malignant neoplasm of colon: Secondary | ICD-10-CM

## 2015-08-07 MED ORDER — NA SULFATE-K SULFATE-MG SULF 17.5-3.13-1.6 GM/177ML PO SOLN: 1.0000 | Freq: Once | ORAL | Status: DC

## 2015-08-07 NOTE — Progress Notes (Signed)
Denies allergies to eggs or soy products. Denies complications with sedation or anesthesia. Denies O2 use. Denies use of diet or weight loss medications.  Emmi instructions given for colonoscopy.  

## 2015-08-21 ENCOUNTER — Encounter: Payer: Self-pay | Admitting: Gastroenterology

## 2015-08-21 ENCOUNTER — Ambulatory Visit (AMBULATORY_SURGERY_CENTER): Payer: BLUE CROSS/BLUE SHIELD | Admitting: Gastroenterology

## 2015-08-21 VITALS — BP 114/73 | HR 71 | Temp 97.7°F | Resp 14 | Ht 75.0 in | Wt 224.0 lb

## 2015-08-21 DIAGNOSIS — Z1211 Encounter for screening for malignant neoplasm of colon: Secondary | ICD-10-CM

## 2015-08-21 MED ORDER — SODIUM CHLORIDE 0.9 % IV SOLN: 500.0000 mL | INTRAVENOUS | Status: DC

## 2015-08-21 NOTE — Op Note (Signed)
[facility] Patient Vincent Ray Procedure Date: 08/21/2015 10:35 AM MRN: [id number] Endoscopist: Meryl Dare , MD Age: 60 Referring MD:  Date of Birth: [birth year] Gender: Male Procedure:                Colonoscopy Indications:              Screening for colorectal malignant neoplasm Medicines:                Monitored Anesthesia Care Procedure:                Pre-Anesthesia Assessment:                           - Prior to the procedure, a History and Physical                            was performed, and patient medications and                            allergies were reviewed. The patient's tolerance of                            previous anesthesia was also reviewed. The risks                            and benefits of the procedure and the sedation                            options and risks were discussed with the patient.                            All questions were answered, and informed consent                            was obtained. Prior Anticoagulants: The patient has                            taken no previous anticoagulant or antiplatelet                            agents. ASA Grade Assessment: II - A patient with                            mild systemic disease. After reviewing the risks                            and benefits, the patient was deemed in                            satisfactory condition to undergo the procedure.                           After obtaining informed consent, the colonoscope  was passed under direct vision. Throughout the                            procedure, the patient's blood pressure, pulse, and                            oxygen saturations were monitored continuously. The                            Model PCF-H190L [id number]) scope was introduced                            through the anus and advanced to the the cecum,                            identified by appendiceal orifice  and ileocecal                            valve. The colonoscopy was performed without                            difficulty. The patient tolerated the procedure                            well. The quality of the bowel preparation was                            excellent. The ileocecal valve, appendiceal                            orifice, and rectum were photographed. Scope In: 11:24:38 AM Scope Out: 11:36:09 AM Scope Withdrawal Time: 0 hours 9 minutes 33 seconds  Total Procedure Duration: 0 hours 11 minutes 31 seconds  Findings:      The digital rectal exam was normal.      A few small-mouthed diverticula were found in the sigmoid colon.      Internal hemorrhoids were found during retroflexion. The hemorrhoids       were small and Grade I (internal hemorrhoids that do not prolapse).      The exam was otherwise normal throughout the examined colon.      No additional abnormalities were found on retroflexion. Complications:            No immediate complications. Estimated Blood Loss:     Estimated blood loss: none. Impression:               - Diverticulosis in the sigmoid colon.                           - Internal hemorrhoids. Recommendation:           - Patient has a contact number available for                            emergencies. The signs and symptoms of potential  delayed complications were discussed with the                            patient. Return to normal activities tomorrow.                            Written discharge instructions were provided to the                            patient.                           - High fiber diet.                           - Continue present medications.                           - Repeat colonoscopy in 10 years for screening                            purposes. Procedure Code(s):        --- Professional ---                           B1478, Colorectal cancer screening; colonoscopy on                             individual not meeting criteria for high risk CPT copyright 2016 American Medical Association. All rights reserved. Meryl Dare, MD 08/21/2015 11:42:07 AM This report has been signed electronically. Number of Addenda: 0 Referring MD:      Corwin Levins, MD

## 2015-08-21 NOTE — Progress Notes (Signed)
A/ox3 pleased with MAC, report to Suzanne RN 

## 2015-08-21 NOTE — Progress Notes (Signed)
Patient denies any allergies to eggs or soy. 

## 2015-08-21 NOTE — Patient Instructions (Signed)
YOU HAD AN ENDOSCOPIC PROCEDURE TODAY AT THE [facility]:   Refer to the procedure report that was given to you for any specific questions about what was found during the examination.  If the procedure report does not answer your questions, please call your gastroenterologist to clarify.  If you requested that your care partner not be given the details of your procedure findings, then the procedure report has been included in a sealed envelope for you to review at your convenience later.  YOU SHOULD EXPECT: Some feelings of bloating in the abdomen. Passage of more gas than usual.  Walking can help get rid of the air that was put into your GI tract during the procedure and reduce the bloating. If you had a lower endoscopy (such as a colonoscopy or flexible sigmoidoscopy) you may notice spotting of blood in your stool or on the toilet paper. If you underwent a bowel prep for your procedure, you may not have a normal bowel movement for a few days.  Please Note:  You might notice some irritation and congestion in your nose or some drainage.  This is from the oxygen used during your procedure.  There is no need for concern and it should clear up in a day or so.  SYMPTOMS TO REPORT IMMEDIATELY:   Following lower endoscopy (colonoscopy or flexible sigmoidoscopy):  Excessive amounts of blood in the stool  Significant tenderness or worsening of abdominal pains  Swelling of the abdomen that is new, acute  Fever of 100F or higher   For urgent or emergent issues, a gastroenterologist can be reached at any hour by calling [phone].   DIET: Your first meal following the procedure should be a small meal and then it is ok to progress to your normal diet. Heavy or fried foods are harder to digest and may make you feel nauseous or bloated.  Likewise, meals heavy in dairy and vegetables can increase bloating.  Drink plenty of fluids but you should avoid alcoholic beverages for 24 hours.  Try to  eat more fiber and drink plenty of water.  ACTIVITY:  You should plan to take it easy for the rest of today and you should NOT DRIVE or use heavy machinery until tomorrow (because of the sedation medicines used during the test).    FOLLOW UP: Our staff will call the number listed on your records the next business day following your procedure to check on you and address any questions or concerns that you may have regarding the information given to you following your procedure. If we do not reach you, we will leave a message.  However, if you are feeling well and you are not experiencing any problems, there is no need to return our call.  We will assume that you have returned to your regular daily activities without incident.  If any biopsies were taken you will be contacted by phone or by letter within the next 1-3 weeks.  Please call us at [phone] if you have not heard about the biopsies in 3 weeks.    SIGNATURES/CONFIDENTIALITY: You and/or your care partner have signed paperwork which will be entered into your electronic medical record.  These signatures attest to the fact that that the information above on your After Visit Summary has been reviewed and is understood.  Full responsibility of the confidentiality of this discharge information lies with you and/or your care-partner.  Read all of the information given to you by your recovery room nurse.

## 2015-08-22 ENCOUNTER — Telehealth: Payer: Self-pay | Admitting: *Deleted

## 2015-08-22 NOTE — Telephone Encounter (Signed)
Message left

## 2015-08-26 ENCOUNTER — Ambulatory Visit: Payer: BLUE CROSS/BLUE SHIELD | Admitting: Internal Medicine

## 2015-08-26 DIAGNOSIS — M1711 Unilateral primary osteoarthritis, right knee: Secondary | ICD-10-CM | POA: Diagnosis not present

## 2015-09-30 DIAGNOSIS — M1711 Unilateral primary osteoarthritis, right knee: Secondary | ICD-10-CM | POA: Diagnosis not present

## 2015-10-06 IMAGING — DX DG ANKLE COMPLETE 3+V*R*
3 series · 3 of 3 positions shown · non-contrast
Comparison: 11/05/2014

CLINICAL DATA: Status post right ankle ORIF

EXAM:
RIGHT ANKLE - COMPLETE 3+ VIEW

[ankle ap]
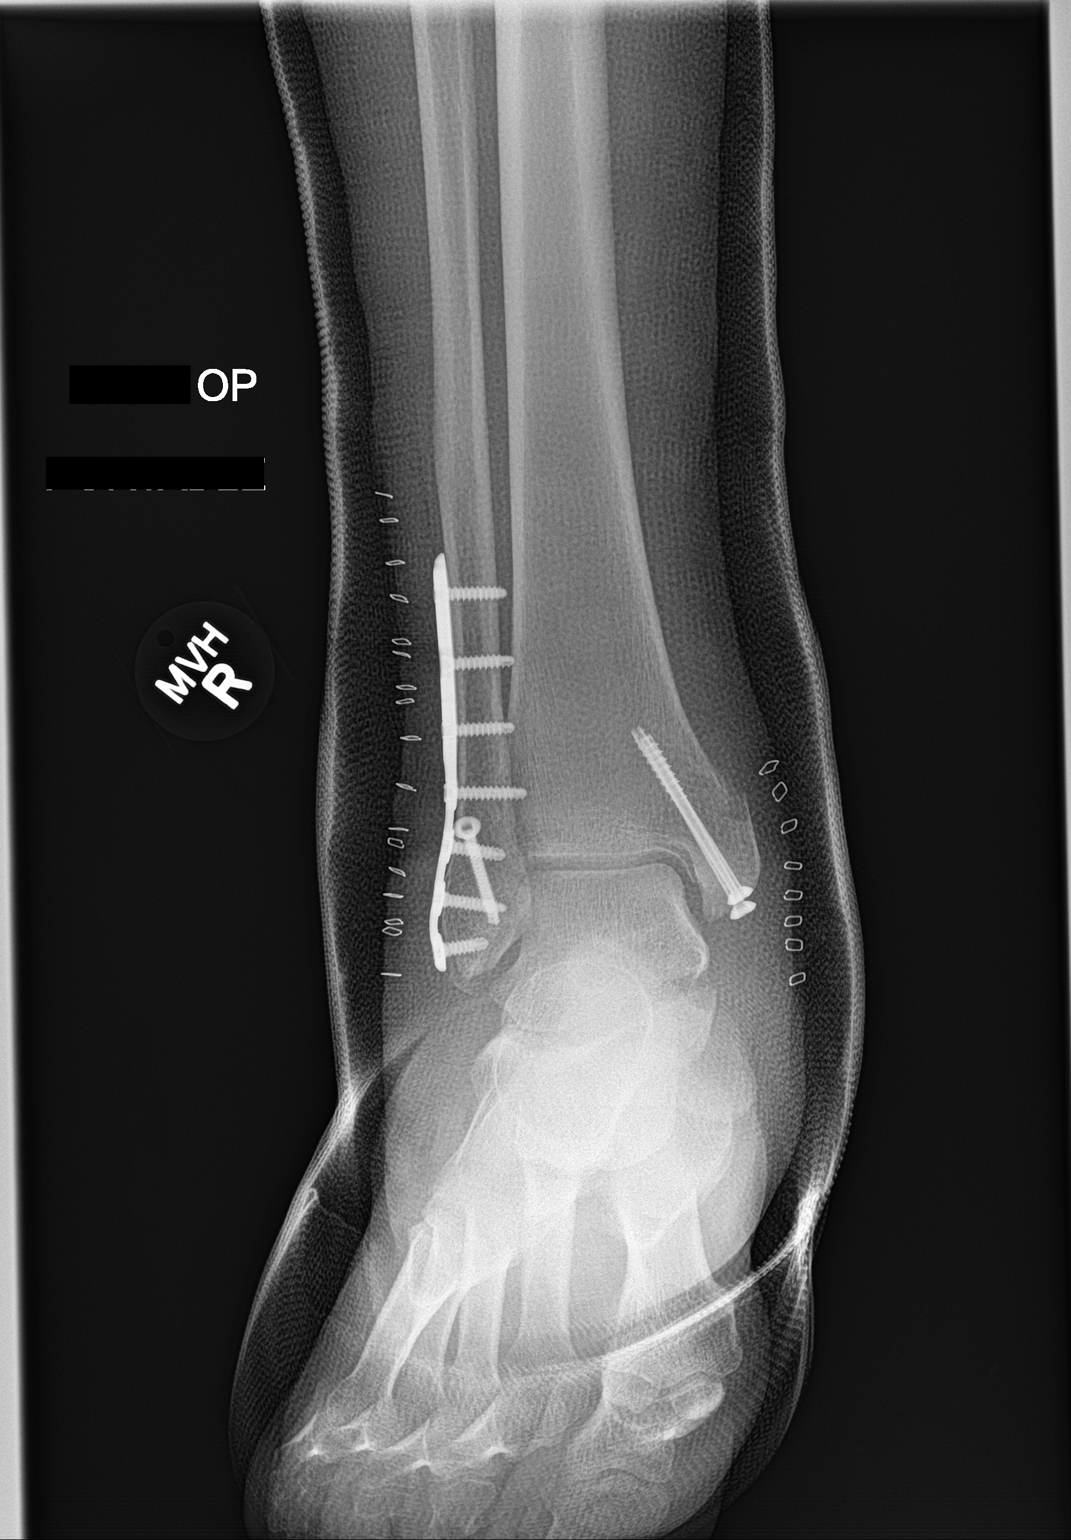

[ankle lat]
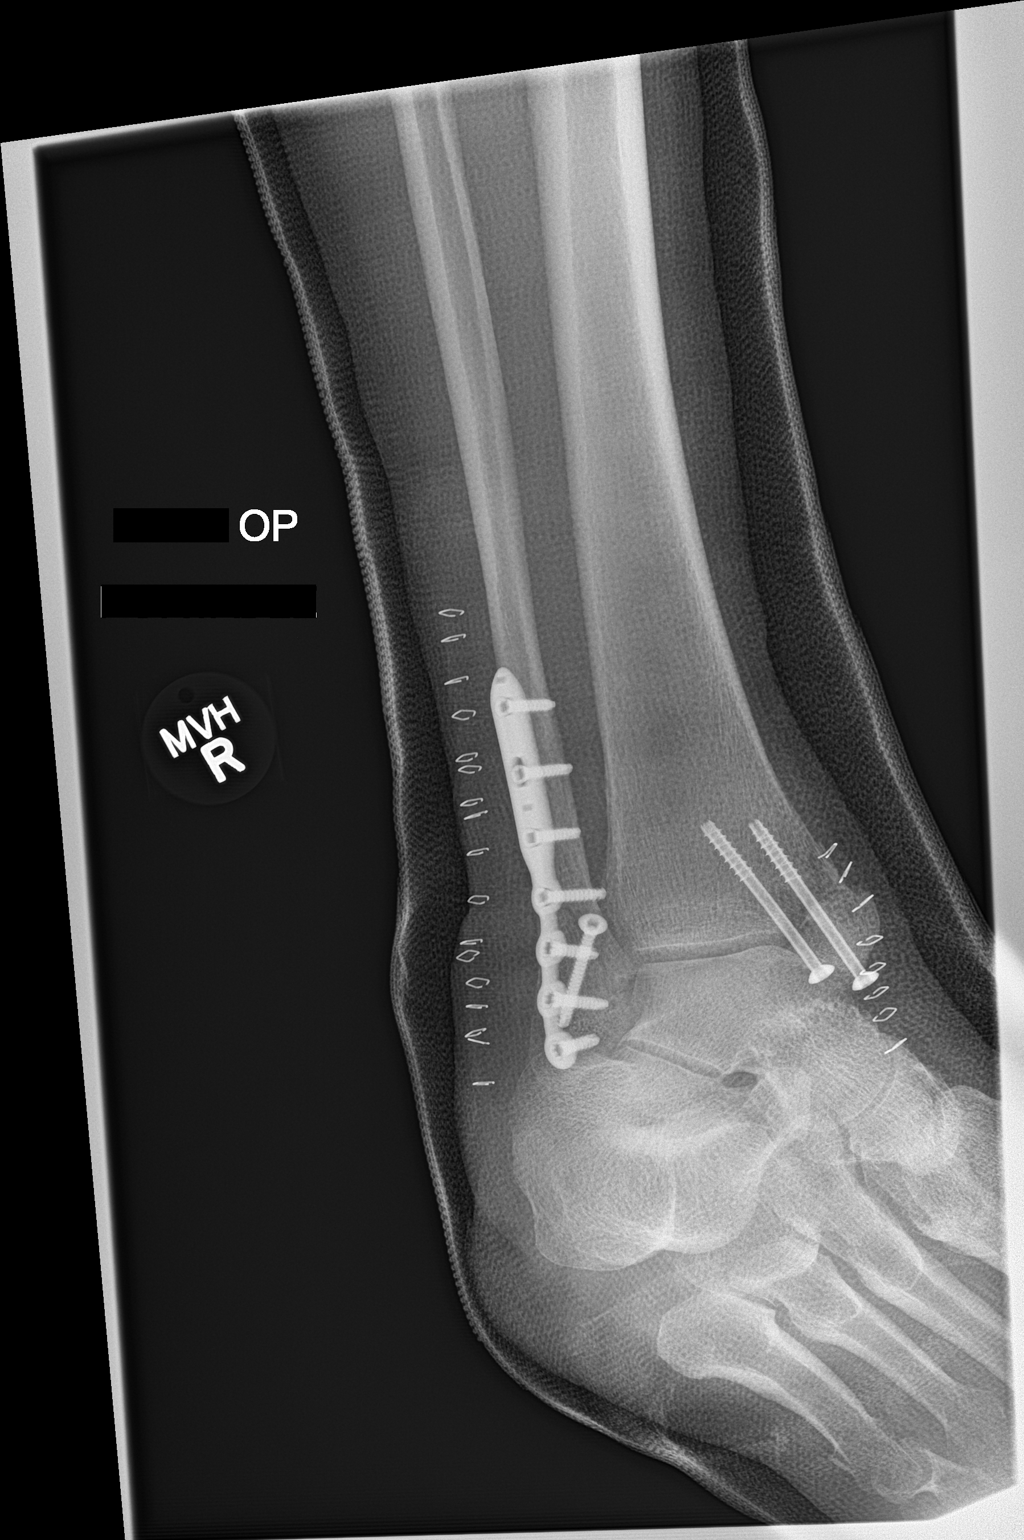

[ankle obl]
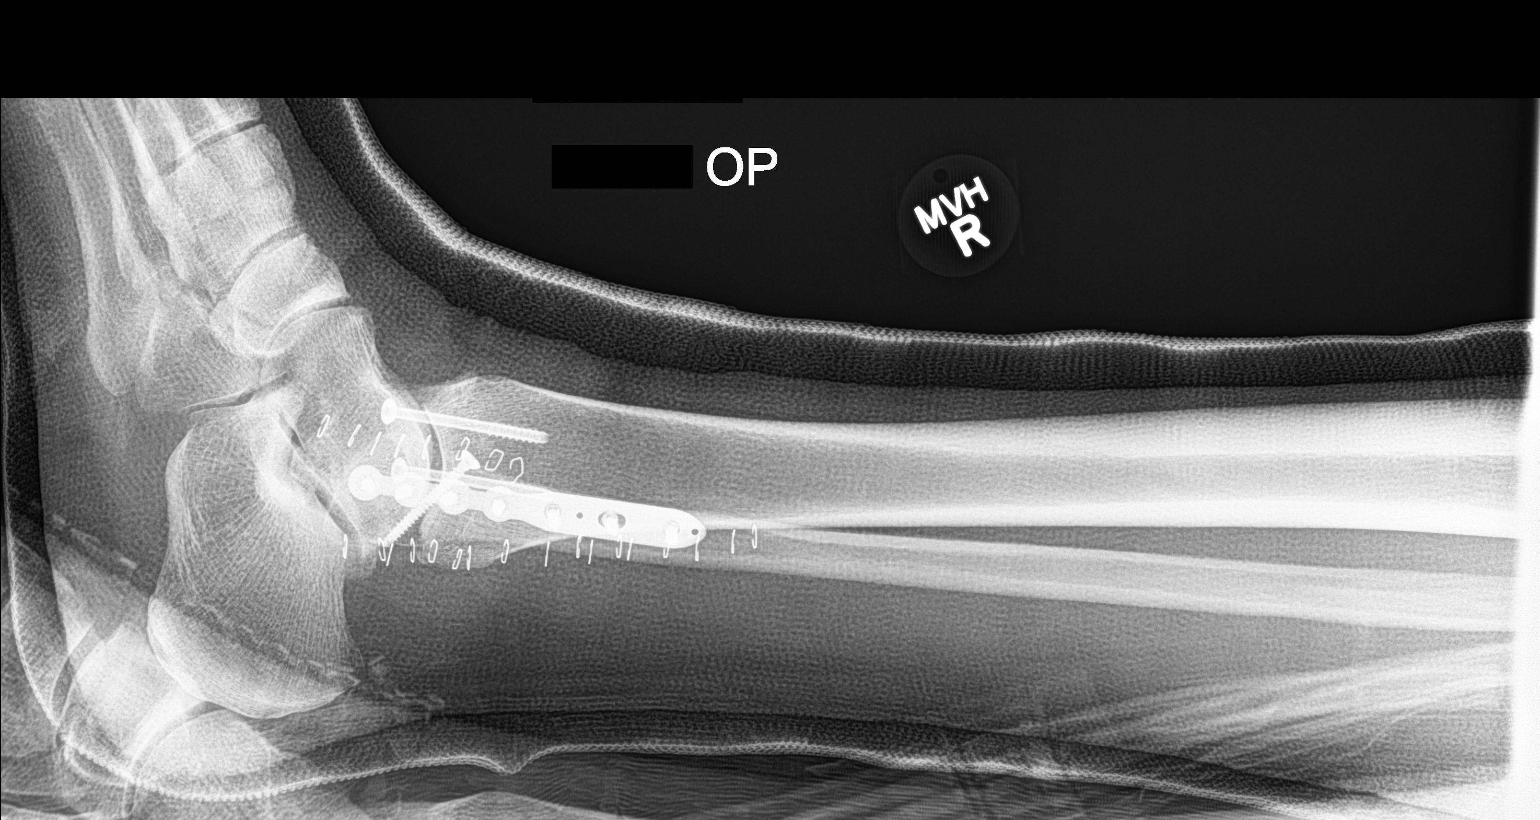

[3 of 3 positions shown; findings below may reference images not displayed]

FINDINGS: Cast artifact obscures detail. There has been side plate and screw
fixation of distal fibular fracture and screw fixation of medial
malleolar fracture as seen previously. Fracture fragments are in
near anatomic alignment. Mortise is symmetric. No evidence for
hardware failure.
IMPRESSION: Expected postoperative appearance after bimalleolar fracture
fixation as above.

## 2015-10-10 DIAGNOSIS — M1711 Unilateral primary osteoarthritis, right knee: Secondary | ICD-10-CM | POA: Diagnosis not present

## 2015-10-21 DIAGNOSIS — M1711 Unilateral primary osteoarthritis, right knee: Secondary | ICD-10-CM | POA: Diagnosis not present

## 2016-01-20 DIAGNOSIS — M25571 Pain in right ankle and joints of right foot: Secondary | ICD-10-CM | POA: Diagnosis not present

## 2016-01-20 DIAGNOSIS — S82841D Displaced bimalleolar fracture of right lower leg, subsequent encounter for closed fracture with routine healing: Secondary | ICD-10-CM | POA: Diagnosis not present

## 2016-01-20 DIAGNOSIS — Z967 Presence of other bone and tendon implants: Secondary | ICD-10-CM | POA: Diagnosis not present

## 2016-01-30 DIAGNOSIS — M25571 Pain in right ankle and joints of right foot: Secondary | ICD-10-CM | POA: Diagnosis not present

## 2016-02-06 DIAGNOSIS — M2141 Flat foot [pes planus] (acquired), right foot: Secondary | ICD-10-CM | POA: Diagnosis not present

## 2016-02-06 DIAGNOSIS — Z9689 Presence of other specified functional implants: Secondary | ICD-10-CM | POA: Diagnosis not present

## 2016-02-06 DIAGNOSIS — M25571 Pain in right ankle and joints of right foot: Secondary | ICD-10-CM | POA: Diagnosis not present

## 2016-02-27 ENCOUNTER — Other Ambulatory Visit: Payer: Self-pay | Admitting: Orthopedic Surgery

## 2016-03-12 DIAGNOSIS — M1711 Unilateral primary osteoarthritis, right knee: Secondary | ICD-10-CM | POA: Diagnosis not present

## 2016-03-12 DIAGNOSIS — M25561 Pain in right knee: Secondary | ICD-10-CM | POA: Diagnosis not present

## 2016-03-19 ENCOUNTER — Telehealth: Payer: Self-pay | Admitting: Internal Medicine

## 2016-03-19 DIAGNOSIS — R9431 Abnormal electrocardiogram [ECG] [EKG]: Secondary | ICD-10-CM

## 2016-03-19 DIAGNOSIS — I10 Essential (primary) hypertension: Secondary | ICD-10-CM

## 2016-03-19 NOTE — Telephone Encounter (Signed)
To be clear, I did not cancel any surgury  OK for echo referral based on last visit  I dont think he really needs the Visit next Wed; just needs the echo, and we were going to consider card referral if abnormal

## 2016-03-19 NOTE — Telephone Encounter (Signed)
Patient came back in and scheduled appt for wed at 811.  Please follow up in regard.

## 2016-03-19 NOTE — Telephone Encounter (Signed)
Patient is requesting call back.  States Dr. Jonny Ruiz denied surgery he has scheduled for this upcoming Thursday b/c he needs an EKG done.  Patient states that the condition Dr. Jonny Ruiz marked is a condition he does not have.  I tried to get patient to schedule appt for wed with Dr. Jonny Ruiz.  Patient did not want to schedule.  He is requesting call back as soon as possible.

## 2016-03-24 ENCOUNTER — Ambulatory Visit: Payer: BLUE CROSS/BLUE SHIELD | Admitting: Internal Medicine

## 2016-03-25 ENCOUNTER — Encounter (HOSPITAL_COMMUNITY): Admission: RE | Payer: Self-pay | Source: Ambulatory Visit

## 2016-03-25 ENCOUNTER — Ambulatory Visit (HOSPITAL_COMMUNITY)
Admission: RE | Admit: 2016-03-25 | Payer: BLUE CROSS/BLUE SHIELD | Source: Ambulatory Visit | Admitting: Orthopedic Surgery

## 2016-03-25 SURGERY — RECESSION, MUSCLE, GASTROCNEMIUS
Anesthesia: General | Laterality: Right

## 2016-03-26 ENCOUNTER — Encounter: Payer: Self-pay | Admitting: Internal Medicine

## 2016-03-26 ENCOUNTER — Telehealth: Payer: Self-pay | Admitting: Internal Medicine

## 2016-03-26 ENCOUNTER — Other Ambulatory Visit: Payer: Self-pay | Admitting: Internal Medicine

## 2016-03-26 NOTE — Telephone Encounter (Signed)
Called and spoke to patient, patient was very verbal and rude about advisement. Made patient aware that we were unable to sign release at this time and for the reason being. Patient hung up during phone call.

## 2016-03-26 NOTE — Telephone Encounter (Signed)
I received a message from patient demanding I send a release for surgury to his orthopedic  I have reviewed the chart, and note again that patient may have a degree of heart disease not well characterized as of this date based on the ECG at last visit, and patient has been non compliant with f/u for echocardiogram, and f/u office visit at 3 months.  Pt last visit was Jan 2017.  I decline to sign the release form due to this  Pt to be dismissed from the practice due to non compliance with recommended evaluation to define his cardiac health  Corinne to let pt know that I am unable to sign any release for surgury as he has not been able to have the echocardiogram, nor followed up at 3 months after last visit as recommended, in order to see if cardiology evaluation would be indicated

## 2016-04-01 ENCOUNTER — Ambulatory Visit (HOSPITAL_COMMUNITY)
Admission: RE | Admit: 2016-04-01 | Discharge: 2016-04-01 | Disposition: A | Discharge status: Home / Self Care | Payer: BLUE CROSS/BLUE SHIELD | Source: Ambulatory Visit | Attending: Internal Medicine | Admitting: Internal Medicine

## 2016-04-01 DIAGNOSIS — R9431 Abnormal electrocardiogram [ECG] [EKG]: Secondary | ICD-10-CM | POA: Insufficient documentation

## 2016-04-01 DIAGNOSIS — I351 Nonrheumatic aortic (valve) insufficiency: Secondary | ICD-10-CM | POA: Diagnosis not present

## 2016-04-01 DIAGNOSIS — I10 Essential (primary) hypertension: Secondary | ICD-10-CM | POA: Insufficient documentation

## 2016-04-01 LAB — ECHOCARDIOGRAM COMPLETE
E decel time: 232 msec
EERAT: 7.45
FS: 25 % — AB (ref 28–44)
IV/PV OW: 1.05
LA diam index: 0.9 cm/m2
LASIZE: 21 mm
LAVOL: 39.4 mL
LAVOLA4C: 24.5 mL
LAVOLIN: 16.9 mL/m2
LDCA: 4.52 cm2
LEFT ATRIUM END SYS DIAM: 21 mm
LV PW d: 11.7 mm — AB (ref 0.6–1.1)
LV e' LATERAL: 9.25 cm/s
LVEEAVG: 7.45
LVEEMED: 7.45
LVOTD: 24 mm
MV Dec: 232
MVPKAVEL: 80.5 m/s
MVPKEVEL: 68.9 m/s
P 1/2 time: 603 ms
TDI e' lateral: 9.25
TDI e' medial: 6.09

## 2016-04-01 NOTE — Progress Notes (Signed)
  Echocardiogram 2D Echocardiogram has been performed.  Vincent Ray 04/01/2016, 4:04 PM

## 2016-04-06 ENCOUNTER — Encounter (HOSPITAL_COMMUNITY): Payer: Self-pay | Admitting: *Deleted

## 2016-04-06 ENCOUNTER — Ambulatory Visit: Payer: Self-pay | Admitting: Orthopedic Surgery

## 2016-04-06 NOTE — Progress Notes (Signed)
Scheduling for pre op- please place SURGICAL ORDERS IN EPIC  THANKS

## 2016-04-07 ENCOUNTER — Other Ambulatory Visit (HOSPITAL_COMMUNITY): Payer: BLUE CROSS/BLUE SHIELD

## 2016-04-12 NOTE — Patient Instructions (Signed)
Daron Offer [facility]  04/12/2016   Your procedure is scheduled on: 04/23/2016    Report to [facility] Main Main  Entrance take [facility]  elevators to 3rd floor to  Short Stay Center at   1100 AM.  Call this number if you have problems the morning of surgery [phone]   Remember: ONLY 1 PERSON MAY GO WITH YOU TO SHORT STAY TO GET  READY MORNING OF YOUR SURGERY.  Do not eat food or drink liquids :After Midnight.     Take these medicines the morning of surgery with A SIP OF WATER: none                                 You may not have any metal on your body including hair pins and              piercings  Do not wear jewelry,  lotions, powders or perfumes, deodorant                          Men may shave face and neck.   Do not bring valuables to the hospital. [facility] IS NOT             RESPONSIBLE   FOR VALUABLES.  Contacts, dentures or bridgework may not be worn into surgery.  Leave suitcase in the car. After surgery it may be brought to your room.         Special Instructions: N/A              Please read over the following fact sheets you were given: _____________________________________________________________________             [facility] - Preparing for Surgery Before surgery, you can play an important role.  Because skin is not sterile, your skin needs to be as free of germs as possible.  You can reduce the number of germs on your skin by washing with CHG (chlorahexidine gluconate) soap before surgery.  CHG is an antiseptic cleaner which kills germs and bonds with the skin to continue killing germs even after washing. Please DO NOT use if you have an allergy to CHG or antibacterial soaps.  If your skin becomes reddened/irritated stop using the CHG and inform your nurse when you arrive at Short Stay. Do not shave (including legs and underarms) for at least 48 hours prior to the first CHG shower.  You may shave your face/neck. Please follow these  instructions carefully:  1.  Shower with CHG Soap the night before surgery and the  morning of Surgery.  2.  If you choose to wash your hair, wash your hair first as usual with your  normal  shampoo.  3.  After you shampoo, rinse your hair and body thoroughly to remove the  shampoo.                           4.  Use CHG as you would any other liquid soap.  You can apply chg directly  to the skin and wash                       Gently with a scrungie or clean washcloth.  5.  Apply the CHG Soap to your body ONLY  FROM THE NECK DOWN.   Do not use on face/ open                           Wound or open sores. Avoid contact with eyes, ears mouth and genitals (private parts).                       Wash face,  Genitals (private parts) with your normal soap.             6.  Wash thoroughly, paying special attention to the area where your surgery  will be performed.  7.  Thoroughly rinse your body with warm water from the neck down.  8.  DO NOT shower/wash with your normal soap after using and rinsing off  the CHG Soap.                9.  Pat yourself dry with a clean towel.            10.  Wear clean pajamas.            11.  Place clean sheets on your bed the night of your first shower and do not  sleep with pets. Day of Surgery : Do not apply any lotions/deodorants the morning of surgery.  Please wear clean clothes to the hospital/surgery center.  FAILURE TO FOLLOW THESE INSTRUCTIONS MAY RESULT IN THE CANCELLATION OF YOUR SURGERY PATIENT SIGNATURE_________________________________  NURSE SIGNATURE__________________________________  ________________________________________________________________________  WHAT IS A BLOOD TRANSFUSION? Blood Transfusion Information  A transfusion is the replacement of blood or some of its parts. Blood is made up of multiple cells which provide different functions.  Red blood cells carry oxygen and are used for blood loss replacement.  White blood cells fight against  infection.  Platelets control bleeding.  Plasma helps clot blood.  Other blood products are available for specialized needs, such as hemophilia or other clotting disorders. BEFORE THE TRANSFUSION  Who gives blood for transfusions?   Healthy volunteers who are fully evaluated to make sure their blood is safe. This is blood bank blood. Transfusion therapy is the safest it has ever been in the practice of medicine. Before blood is taken from a donor, a complete history is taken to make sure that person has no history of diseases nor engages in risky social behavior (examples are intravenous drug use or sexual activity with multiple partners). The donor's travel history is screened to minimize risk of transmitting infections, such as malaria. The donated blood is tested for signs of infectious diseases, such as HIV and hepatitis. The blood is then tested to be sure it is compatible with you in order to minimize the chance of a transfusion reaction. If you or a relative donates blood, this is often done in anticipation of surgery and is not appropriate for emergency situations. It takes many days to process the donated blood. RISKS AND COMPLICATIONS Although transfusion therapy is very safe and saves many lives, the main dangers of transfusion include:   Getting an infectious disease.  Developing a transfusion reaction. This is an allergic reaction to something in the blood you were given. Every precaution is taken to prevent this. The decision to have a blood transfusion has been considered carefully by your caregiver before blood is given. Blood is not given unless the benefits outweigh the risks. AFTER THE TRANSFUSION  Right after receiving a blood transfusion, you will usually feel much better  and more energetic. This is especially true if your red blood cells have gotten low (anemic). The transfusion raises the level of the red blood cells which carry oxygen, and this usually causes an energy  increase.  The nurse administering the transfusion will monitor you carefully for complications. HOME CARE INSTRUCTIONS  No special instructions are needed after a transfusion. You may find your energy is better. Speak with your caregiver about any limitations on activity for underlying diseases you may have. SEEK MEDICAL CARE IF:   Your condition is not improving after your transfusion.  You develop redness or irritation at the intravenous (IV) site. SEEK IMMEDIATE MEDICAL CARE IF:  Any of the following symptoms occur over the next 12 hours:  Shaking chills.  You have a temperature by mouth above 102 F (38.9 C), not controlled by medicine.  Chest, back, or muscle pain.  People around you feel you are not acting correctly or are confused.  Shortness of breath or difficulty breathing.  Dizziness and fainting.  You get a rash or develop hives.  You have a decrease in urine output.  Your urine turns a dark color or changes to pink, red, or brown. Any of the following symptoms occur over the next 10 days:  You have a temperature by mouth above 102 F (38.9 C), not controlled by medicine.  Shortness of breath.  Weakness after normal activity.  The white part of the eye turns yellow (jaundice).  You have a decrease in the amount of urine or are urinating less often.  Your urine turns a dark color or changes to pink, red, or brown. Document Released: 05/07/2000 Document Revised: 08/02/2011 Document Reviewed: 12/25/2007 ExitCare Patient Information 2014 [place].  _______________________________________________________________________  Incentive Spirometer  An incentive spirometer is a tool that can help keep your lungs clear and active. This tool measures how well you are filling your lungs with each breath. Taking long deep breaths may help reverse or decrease the chance of developing breathing (pulmonary) problems (especially infection) following:  A long  period of time when you are unable to move or be active. BEFORE THE PROCEDURE   If the spirometer includes an indicator to show your best effort, your nurse or respiratory therapist will set it to a desired goal.  If possible, sit up straight or lean slightly forward. Try not to slouch.  Hold the incentive spirometer in an upright position. INSTRUCTIONS FOR USE  1. Sit on the edge of your bed if possible, or sit up as far as you can in bed or on a chair. 2. Hold the incentive spirometer in an upright position. 3. Breathe out normally. 4. Place the mouthpiece in your mouth and seal your lips tightly around it. 5. Breathe in slowly and as deeply as possible, raising the piston or the ball toward the top of the column. 6. Hold your breath for 3-5 seconds or for as long as possible. Allow the piston or ball to fall to the bottom of the column. 7. Remove the mouthpiece from your mouth and breathe out normally. 8. Rest for a few seconds and repeat Steps 1 through 7 at least 10 times every 1-2 hours when you are awake. Take your time and take a few normal breaths between deep breaths. 9. The spirometer may include an indicator to show your best effort. Use the indicator as a goal to work toward during each repetition. 10. After each set of 10 deep breaths, practice coughing to be sure your  lungs are clear. If you have an incision (the cut made at the time of surgery), support your incision when coughing by placing a pillow or rolled up towels firmly against it. Once you are able to get out of bed, walk around indoors and cough well. You may stop using the incentive spirometer when instructed by your caregiver.  RISKS AND COMPLICATIONS  Take your time so you do not get dizzy or light-headed.  If you are in pain, you may need to take or ask for pain medication before doing incentive spirometry. It is harder to take a deep breath if you are having pain. AFTER USE  Rest and breathe slowly and  easily.  It can be helpful to keep track of a log of your progress. Your caregiver can provide you with a simple table to help with this. If you are using the spirometer at home, follow these instructions: [place] IF:   You are having difficultly using the spirometer.  You have trouble using the spirometer as often as instructed.  Your pain medication is not giving enough relief while using the spirometer.  You develop fever of 100.5 F (38.1 C) or higher. SEEK IMMEDIATE MEDICAL CARE IF:   You cough up bloody sputum that had not been present before.  You develop fever of 102 F (38.9 C) or greater.  You develop worsening pain at or near the incision site. MAKE SURE YOU:   Understand these instructions.  Will watch your condition.  Will get help right away if you are not doing well or get worse. Document Released: 09/20/2006 Document Revised: 08/02/2011 Document Reviewed: 11/21/2006 [facility] Patient Information 2014 [place], [place].   ________________________________________________________________________

## 2016-04-13 ENCOUNTER — Inpatient Hospital Stay (HOSPITAL_COMMUNITY): Admission: RE | Admit: 2016-04-13 | Payer: BLUE CROSS/BLUE SHIELD | Source: Ambulatory Visit

## 2016-04-13 ENCOUNTER — Ambulatory Visit: Payer: Self-pay | Admitting: Orthopedic Surgery

## 2016-04-13 ENCOUNTER — Encounter (HOSPITAL_COMMUNITY)
Admission: RE | Admit: 2016-04-13 | Discharge: 2016-04-13 | Disposition: A | Discharge status: Home / Self Care | Payer: BLUE CROSS/BLUE SHIELD | Source: Ambulatory Visit | Attending: Specialist | Admitting: Specialist

## 2016-04-13 ENCOUNTER — Encounter (HOSPITAL_COMMUNITY): Payer: Self-pay

## 2016-04-13 DIAGNOSIS — Z01812 Encounter for preprocedural laboratory examination: Secondary | ICD-10-CM | POA: Insufficient documentation

## 2016-04-13 DIAGNOSIS — M1711 Unilateral primary osteoarthritis, right knee: Secondary | ICD-10-CM | POA: Insufficient documentation

## 2016-04-13 HISTORY — DX: Unspecified osteoarthritis, unspecified site: M19.90

## 2016-04-13 LAB — SURGICAL PCR SCREEN
MRSA, PCR: NEGATIVE
STAPHYLOCOCCUS AUREUS: POSITIVE — AB

## 2016-04-13 LAB — CBC
HEMATOCRIT: 44.1 % (ref 39.0–52.0)
Hemoglobin: 14.9 g/dL (ref 13.0–17.0)
MCH: 30 pg (ref 26.0–34.0)
MCHC: 33.8 g/dL (ref 30.0–36.0)
MCV: 88.7 fL (ref 78.0–100.0)
PLATELETS: 261 10*3/uL (ref 150–400)
RBC: 4.97 MIL/uL (ref 4.22–5.81)
RDW: 13.8 % (ref 11.5–15.5)
WBC: 5.8 10*3/uL (ref 4.0–10.5)

## 2016-04-13 LAB — URINE MICROSCOPIC-ADD ON

## 2016-04-13 LAB — URINALYSIS, ROUTINE W REFLEX MICROSCOPIC
BILIRUBIN URINE: NEGATIVE
Glucose, UA: NEGATIVE mg/dL
Ketones, ur: NEGATIVE mg/dL
LEUKOCYTES UA: NEGATIVE
NITRITE: NEGATIVE
PH: 5.5 (ref 5.0–8.0)
Protein, ur: NEGATIVE mg/dL
SPECIFIC GRAVITY, URINE: 1.026 (ref 1.005–1.030)

## 2016-04-13 LAB — PROTIME-INR
INR: 0.97
Prothrombin Time: 12.8 seconds (ref 11.4–15.2)

## 2016-04-13 LAB — BASIC METABOLIC PANEL
Anion gap: 5 (ref 5–15)
BUN: 16 mg/dL (ref 6–20)
CHLORIDE: 107 mmol/L (ref 101–111)
CO2: 27 mmol/L (ref 22–32)
CREATININE: 1.18 mg/dL (ref 0.61–1.24)
Calcium: 9 mg/dL (ref 8.9–10.3)
GFR calc Af Amer: >60 mL/min (ref >60)
GFR calc non Af Amer: >60 mL/min (ref >60)
GLUCOSE: 95 mg/dL (ref 65–99)
POTASSIUM: 4.1 mmol/L (ref 3.5–5.1)
SODIUM: 139 mmol/L (ref 135–145)

## 2016-04-13 LAB — APTT: aPTT: 29 seconds (ref 24–36)

## 2016-04-13 LAB — ABO/RH: ABO/RH(D): O POS

## 2016-04-13 NOTE — H&P (Signed)
Vincent Ray DOB: November 14, 1955 Separated / Language: [place] / Race: Black or African American Male  H&P Date: 04/13/16  Chief Complaint: Right knee pain  History of Present Illness The patient is a 60 year old male who comes in today for a preoperative History and Physical. The patient is scheduled for a right total knee arthroplasty to be performed by Dr. Javier Docker, MD at [facility] on 04/23/16. [name] reports chronic pain R knee refractory to multiple injections, quad strengthening, bracing, HEP, medications. At this point pain is interfering with ADLs and he desires to proceed with surgery (total knee replacement). He is also anticipating R ankle/foot surgery with Dr. Victorino Dike early next year.  Dr. Shelle Iron and the patient mutually agreed to proceed with a right total knee replacement. Risks and benefits of the procedure were discussed including stiffness, suboptimal range of motion, persistent pain, infection requiring removal of prosthesis and reinsertion, need for prophylactic antibiotics in the future, for example, dental procedures, possible need for manipulation, revision in the future and also anesthetic complications including DVT, PE, etc. We discussed the perioperative course, time in the hospital, postoperative recovery and the need for elevation to control swelling. We also discussed the predicted range of motion and the probability that squatting and kneeling would be unobtainable in the future. In addition, postoperative anticoagulation was discussed. We have obtained preoperative medical clearance as necessary. Provided her illustrated handout and discussed it in detail. They will enroll in the total joint replacement educational forum at the hospital. He had his [facility] pre-op appt today.  Problem List/Past Medical Hx Primary osteoarthritis of right knee (M17.11)  Chronic pain of right knee (M25.561)  Bimalleolar fracture of right ankle (H47.425Z)  No pertinent  past medical history   Allergies  No Known Drug Allergies [11/06/2014]:  Family History Family history unknown - Adopted  First Degree Relatives  reported  Social History Tobacco use  Never smoker. 11/06/2014 Current work status  working full time Children  1 Tobacco / smoke exposure  11/06/2014: no Not under pain contract  No history of drug/alcohol rehab  Current drinker  11/06/2014: Currently drinks beer only occasionally per week Exercise  Exercises rarely; does gym / weights Number of flights of stairs before winded  2-3 Marital status  married Living situation  live with spouse  Medication History Aspirin (81MG  Tablet, Oral) Active. Medications Reconciled  Past Surgical History Arthroscopy of Knee  left Other Orthopaedic Surgery  ORIF 10/2014 R ankle  Review of Systems General Not Present- Chills, Fatigue, Fever, Memory Loss, Night Sweats, Weight Gain and Weight Loss. Skin Not Present- Eczema, Hives, Itching, Lesions and Rash. HEENT Not Present- Dentures, Double Vision, Headache, Hearing Loss, Tinnitus and Visual Loss. Respiratory Not Present- Allergies, Chronic Cough, Coughing up blood, Shortness of breath at rest and Shortness of breath with exertion. Cardiovascular Not Present- Chest Pain, Difficulty Breathing Lying Down, Murmur, Palpitations, Racing/skipping heartbeats and Swelling. Gastrointestinal Not Present- Abdominal Pain, Bloody Stool, Constipation, Diarrhea, Difficulty Swallowing, Heartburn, Jaundice, Loss of appetitie, Nausea and Vomiting. Male Genitourinary Not Present- Blood in Urine, Discharge, Flank Pain, Incontinence, Painful Urination, Urgency, Urinary frequency, Urinary Retention, Urinating at Night and Weak urinary stream. Musculoskeletal Present- Joint Pain, Joint Stiffness, Joint Swelling and Morning Stiffness. Not Present- Back Pain, Muscle Pain, Muscle Weakness and Spasms. Neurological Not Present- Blackout spells, Difficulty  with balance, Dizziness, Paralysis, Tremor and Weakness. Psychiatric Not Present- Insomnia.  Physical Exam General Mental Status -Alert, cooperative and good historian. General Appearance-pleasant, Not  in acute distress. Orientation-Oriented X3. Build & Nutrition-Well nourished and Well developed.  Head and Neck Head-normocephalic, atraumatic . Neck Global Assessment - supple, no bruit auscultated on the right, no bruit auscultated on the left.  Eye Pupil - Bilateral-Regular and Round. Motion - Bilateral-EOMI.  Chest and Lung Exam Auscultation Breath sounds - clear at anterior chest wall and clear at posterior chest wall. Adventitious sounds - No Adventitious sounds.  Cardiovascular Auscultation Rhythm - Regular rate and rhythm. Heart Sounds - S1 WNL and S2 WNL. Murmurs & Other Heart Sounds - Auscultation of the heart reveals - No Murmurs.  Abdomen Palpation/Percussion Tenderness - Abdomen is non-tender to palpation. Rigidity (guarding) - Abdomen is soft. Auscultation Auscultation of the abdomen reveals - Bowel sounds normal.  Male Genitourinary Not done, not pertinent to present illness  Musculoskeletal Note: He is well-nourished, well-developed, awake, alert and oriented x3. No acute distress. He has an antalgic gait with a varus thrust of the right knee. On examination of the right knee, trace effusion. He is tender to palpation in the medial joint space. Nontender lateral joint space, patella, patellar tendon, quad tendon, fibular head, peroneal nerve, popliteal space. No calf pain or sign of DVT. No pain or laxity with varus or valgus stress. He does have a slight flexion contracture at this point, maybe -3 degrees to 115 degrees of flexion. No pain or laxity with varus or valgus stress. No instability. Mild patellofemoral crepitus noted.  Imaging X-rays ordered, obtained and reviewed today with end-stage right knee osteoarthritis, medial joint spaces  bone-on-bone with a varus deformity. He does have some degenerative changes of the left knee as well, moderate to moderately severe medial joint space narrowing, not yet bone-on-bone with less of a varus alignment.  Assessment & Plan Chronic pain of right knee (M25.561, G89.29) Primary osteoarthritis of right knee (M17.11) Current Plans Follow up in 3 weeks or as needed The patient will need X-Rays at their next visit. Pt Education - Ice Therapy: ice therapy  Pt with end-stage right knee DJD, bone-on-bone, refractory to conservative tx, scheduled for right total knee replacement by Dr. Shelle IronBeane on 04/23/16. We again discussed the procedure itself as well as risks, complications and alternatives, including but not limited to DVT, PE, infx, bleeding, failure of procedure, need for secondary procedure including manipulation, nerve injury, ongoing pain/symptoms, anesthesia risk, even stroke or death. Also discussed typical post-op protocols, activity restrictions, need for PT, flexion/extension exercises, time out of work. Discussed need for DVT ppx post-op with Xarelto then ASA per protocol. Discussed dental ppx. Also discussed limitations post-operatively such as kneeling and squatting. All questions were answered. Patient desires to proceed with surgery as scheduled. Will hold ASA, supplements and NSAIDs accordingly. Will remain NPO after MN night before surgery. [facility]. Plan Xarelto 2 weeks post-op for DVT ppx then ASA. Plan Percocet, Robaxin, Colace. Plan home with HHPT post-op with family members at home for assistance then outpt PT. Will follow up 10-14 days post-op for staple removal and xrays.  Plan right total knee replacement  Signed electronically by Dorothy SparkJaclyn M Jenesis Suchy, PA-C for Dr. Shelle IronBeane

## 2016-04-13 NOTE — Progress Notes (Signed)
U/A and micro done 04/13/16 faxed via EPIC to Dr Shelle Iron.

## 2016-04-13 NOTE — Progress Notes (Signed)
EKG- 05/28/15- EPIC  04/01/16- ECHO-EPIC

## 2016-04-23 ENCOUNTER — Inpatient Hospital Stay (HOSPITAL_COMMUNITY): Payer: BLUE CROSS/BLUE SHIELD

## 2016-04-23 ENCOUNTER — Encounter (HOSPITAL_COMMUNITY): Payer: Self-pay | Admitting: *Deleted

## 2016-04-23 ENCOUNTER — Inpatient Hospital Stay (HOSPITAL_COMMUNITY): Payer: BLUE CROSS/BLUE SHIELD | Admitting: Certified Registered Nurse Anesthetist

## 2016-04-23 ENCOUNTER — Encounter (HOSPITAL_COMMUNITY)
Admission: RE | Disposition: A | Discharge status: Home Health | Payer: Self-pay | Source: Ambulatory Visit | Attending: Specialist

## 2016-04-23 ENCOUNTER — Inpatient Hospital Stay (HOSPITAL_COMMUNITY)
Admission: RE | Admit: 2016-04-23 | Discharge: 2016-04-26 | DRG: 470 | Disposition: A | Discharge status: Home Health | Payer: BLUE CROSS/BLUE SHIELD | Source: Ambulatory Visit | Attending: Specialist | Admitting: Specialist

## 2016-04-23 DIAGNOSIS — M1711 Unilateral primary osteoarthritis, right knee: Principal | ICD-10-CM

## 2016-04-23 DIAGNOSIS — Z471 Aftercare following joint replacement surgery: Secondary | ICD-10-CM | POA: Diagnosis not present

## 2016-04-23 DIAGNOSIS — Z96659 Presence of unspecified artificial knee joint: Secondary | ICD-10-CM

## 2016-04-23 DIAGNOSIS — Z7982 Long term (current) use of aspirin: Secondary | ICD-10-CM | POA: Diagnosis not present

## 2016-04-23 DIAGNOSIS — J309 Allergic rhinitis, unspecified: Secondary | ICD-10-CM | POA: Diagnosis not present

## 2016-04-23 DIAGNOSIS — G8929 Other chronic pain: Secondary | ICD-10-CM | POA: Diagnosis not present

## 2016-04-23 DIAGNOSIS — J45909 Unspecified asthma, uncomplicated: Secondary | ICD-10-CM | POA: Diagnosis present

## 2016-04-23 DIAGNOSIS — Z96641 Presence of right artificial hip joint: Secondary | ICD-10-CM | POA: Diagnosis not present

## 2016-04-23 DIAGNOSIS — F329 Major depressive disorder, single episode, unspecified: Secondary | ICD-10-CM | POA: Diagnosis not present

## 2016-04-23 DIAGNOSIS — M1712 Unilateral primary osteoarthritis, left knee: Secondary | ICD-10-CM | POA: Diagnosis not present

## 2016-04-23 DIAGNOSIS — R269 Unspecified abnormalities of gait and mobility: Secondary | ICD-10-CM | POA: Diagnosis not present

## 2016-04-23 HISTORY — PX: TOTAL KNEE ARTHROPLASTY: SHX125

## 2016-04-23 LAB — TYPE AND SCREEN
ABO/RH(D): O POS
ANTIBODY SCREEN: NEGATIVE

## 2016-04-23 SURGERY — ARTHROPLASTY, KNEE, TOTAL
Anesthesia: Spinal | Site: Knee | Laterality: Right

## 2016-04-23 MED ORDER — DIPHENHYDRAMINE HCL 12.5 MG/5ML PO ELIX: 12.5000 mg | ORAL_SOLUTION | ORAL | Status: DC | PRN

## 2016-04-23 MED ORDER — PROPOFOL 10 MG/ML IV BOLUS
INTRAVENOUS | Status: AC
  Filled 2016-04-23: qty 40

## 2016-04-23 MED ORDER — FENTANYL CITRATE (PF) 100 MCG/2ML IJ SOLN
INTRAMUSCULAR | Status: AC
  Filled 2016-04-23: qty 2

## 2016-04-23 MED ORDER — CLINDAMYCIN PHOSPHATE 900 MG/50ML IV SOLN
INTRAVENOUS | Status: AC
  Filled 2016-04-23: qty 50

## 2016-04-23 MED ORDER — LACTATED RINGERS IV SOLN
INTRAVENOUS | Status: DC
  Administered 2016-04-23 (×2): via INTRAVENOUS
  Administered 2016-04-23: 1000 mL via INTRAVENOUS

## 2016-04-23 MED ORDER — PROPOFOL 500 MG/50ML IV EMUL
INTRAVENOUS | Status: DC | PRN
  Administered 2016-04-23: 25 ug/kg/min via INTRAVENOUS

## 2016-04-23 MED ORDER — ACETAMINOPHEN 650 MG RE SUPP: 650.0000 mg | Freq: Four times a day (QID) | RECTAL | Status: DC | PRN

## 2016-04-23 MED ORDER — OXYCODONE HCL 5 MG PO TABS: 5.0000 mg | ORAL_TABLET | Freq: Once | ORAL | Status: DC | PRN

## 2016-04-23 MED ORDER — CEFAZOLIN SODIUM-DEXTROSE 2-4 GM/100ML-% IV SOLN
2.0000 g | INTRAVENOUS | Status: AC
  Administered 2016-04-23: 2 g via INTRAVENOUS
  Filled 2016-04-23: qty 100

## 2016-04-23 MED ORDER — BISACODYL 5 MG PO TBEC: 5.0000 mg | DELAYED_RELEASE_TABLET | Freq: Every day | ORAL | Status: DC | PRN

## 2016-04-23 MED ORDER — OXYCODONE HCL 5 MG PO TABS
5.0000 mg | ORAL_TABLET | ORAL | Status: DC | PRN
  Administered 2016-04-23: 10 mg via ORAL
  Administered 2016-04-23 (×2): 5 mg via ORAL
  Filled 2016-04-23: qty 2
  Filled 2016-04-23 (×2): qty 1

## 2016-04-23 MED ORDER — RIVAROXABAN 10 MG PO TABS: 10.0000 mg | ORAL_TABLET | Freq: Every day | ORAL | 0 refills | Status: DC

## 2016-04-23 MED ORDER — BUPIVACAINE HCL (PF) 0.25 % IJ SOLN
INTRAMUSCULAR | Status: DC | PRN
  Administered 2016-04-23: 30 mL

## 2016-04-23 MED ORDER — PROPOFOL 10 MG/ML IV BOLUS
INTRAVENOUS | Status: AC
  Filled 2016-04-23: qty 20

## 2016-04-23 MED ORDER — HYDROMORPHONE HCL 1 MG/ML IJ SOLN: 0.2500 mg | INTRAMUSCULAR | Status: DC | PRN

## 2016-04-23 MED ORDER — OXYCODONE HCL 5 MG/5ML PO SOLN: 5.0000 mg | Freq: Once | ORAL | Status: DC | PRN

## 2016-04-23 MED ORDER — LIDOCAINE 2% (20 MG/ML) 5 ML SYRINGE
INTRAMUSCULAR | Status: DC | PRN
  Administered 2016-04-23: 60 mg via INTRAVENOUS

## 2016-04-23 MED ORDER — DEXAMETHASONE SODIUM PHOSPHATE 10 MG/ML IJ SOLN
INTRAMUSCULAR | Status: AC
  Filled 2016-04-23: qty 1

## 2016-04-23 MED ORDER — STERILE WATER FOR IRRIGATION IR SOLN
Status: DC | PRN
  Administered 2016-04-23: 2000 mL

## 2016-04-23 MED ORDER — ONDANSETRON HCL 4 MG/2ML IJ SOLN: 4.0000 mg | Freq: Four times a day (QID) | INTRAMUSCULAR | Status: DC | PRN

## 2016-04-23 MED ORDER — ACETAMINOPHEN 325 MG PO TABS
650.0000 mg | ORAL_TABLET | Freq: Four times a day (QID) | ORAL | Status: DC | PRN
  Administered 2016-04-23 – 2016-04-26 (×7): 650 mg via ORAL
  Filled 2016-04-23 (×7): qty 2

## 2016-04-23 MED ORDER — OXYCODONE HCL 5 MG PO TABS: 5.0000 mg | ORAL_TABLET | ORAL | 0 refills | Status: DC | PRN

## 2016-04-23 MED ORDER — KCL IN DEXTROSE-NACL 20-5-0.45 MEQ/L-%-% IV SOLN
INTRAVENOUS | Status: AC
  Administered 2016-04-23: 50 mL/h via INTRAVENOUS
  Filled 2016-04-23 (×2): qty 1000

## 2016-04-23 MED ORDER — OXYCODONE HCL 5 MG PO TABS
5.0000 mg | ORAL_TABLET | ORAL | Status: DC | PRN
  Administered 2016-04-23: 5 mg via ORAL
  Administered 2016-04-24 – 2016-04-26 (×12): 15 mg via ORAL
  Administered 2016-04-26: 10 mg via ORAL
  Administered 2016-04-26: 15 mg via ORAL
  Filled 2016-04-23 (×2): qty 3
  Filled 2016-04-23: qty 2
  Filled 2016-04-23: qty 3
  Filled 2016-04-23: qty 1
  Filled 2016-04-23 (×10): qty 3

## 2016-04-23 MED ORDER — MIDAZOLAM HCL 5 MG/5ML IJ SOLN
INTRAMUSCULAR | Status: DC | PRN
Start: 2016-04-23 — End: 2016-04-23
  Administered 2016-04-23: 2 mg via INTRAVENOUS

## 2016-04-23 MED ORDER — ALUM & MAG HYDROXIDE-SIMETH 200-200-20 MG/5ML PO SUSP: 30.0000 mL | ORAL | Status: DC | PRN

## 2016-04-23 MED ORDER — CEFAZOLIN SODIUM-DEXTROSE 2-4 GM/100ML-% IV SOLN
INTRAVENOUS | Status: AC
  Filled 2016-04-23: qty 100

## 2016-04-23 MED ORDER — FENTANYL CITRATE (PF) 100 MCG/2ML IJ SOLN
INTRAMUSCULAR | Status: DC | PRN
  Administered 2016-04-23 (×2): 50 ug via INTRAVENOUS

## 2016-04-23 MED ORDER — LACTATED RINGERS IV SOLN: INTRAVENOUS | Status: DC

## 2016-04-23 MED ORDER — CLINDAMYCIN PHOSPHATE 900 MG/50ML IV SOLN
900.0000 mg | INTRAVENOUS | Status: AC
  Administered 2016-04-23: 900 mg via INTRAVENOUS

## 2016-04-23 MED ORDER — BUPIVACAINE IN DEXTROSE 0.75-8.25 % IT SOLN
INTRATHECAL | Status: DC | PRN
  Administered 2016-04-23: 2 mL via INTRATHECAL

## 2016-04-23 MED ORDER — MENTHOL 3 MG MT LOZG: 1.0000 | LOZENGE | OROMUCOSAL | Status: DC | PRN

## 2016-04-23 MED ORDER — POLYETHYLENE GLYCOL 3350 17 G PO PACK
17.0000 g | PACK | Freq: Every day | ORAL | Status: DC | PRN
Start: 2016-04-23 — End: 2016-04-26
  Administered 2016-04-25: 17 g via ORAL
  Filled 2016-04-23: qty 1

## 2016-04-23 MED ORDER — MAGNESIUM CITRATE PO SOLN: 1.0000 | Freq: Once | ORAL | Status: DC | PRN

## 2016-04-23 MED ORDER — ONDANSETRON HCL 4 MG/2ML IJ SOLN
INTRAMUSCULAR | Status: DC | PRN
  Administered 2016-04-23: 4 mg via INTRAVENOUS

## 2016-04-23 MED ORDER — DOCUSATE SODIUM 100 MG PO CAPS: 100.0000 mg | ORAL_CAPSULE | Freq: Two times a day (BID) | ORAL | 1 refills | Status: DC | PRN

## 2016-04-23 MED ORDER — METOCLOPRAMIDE HCL 5 MG/ML IJ SOLN: 5.0000 mg | Freq: Three times a day (TID) | INTRAMUSCULAR | Status: DC | PRN

## 2016-04-23 MED ORDER — LIDOCAINE-EPINEPHRINE 1 %-1:100000 IJ SOLN
INTRAMUSCULAR | Status: DC | PRN
  Administered 2016-04-23: 20 mL

## 2016-04-23 MED ORDER — LIDOCAINE 2% (20 MG/ML) 5 ML SYRINGE
INTRAMUSCULAR | Status: AC
  Filled 2016-04-23: qty 5

## 2016-04-23 MED ORDER — RISAQUAD PO CAPS
1.0000 | ORAL_CAPSULE | Freq: Every day | ORAL | Status: DC
  Administered 2016-04-23 – 2016-04-26 (×4): 1 via ORAL
  Filled 2016-04-23 (×5): qty 1

## 2016-04-23 MED ORDER — ONDANSETRON HCL 4 MG/2ML IJ SOLN
INTRAMUSCULAR | Status: AC
  Filled 2016-04-23: qty 2

## 2016-04-23 MED ORDER — BUPIVACAINE HCL (PF) 0.25 % IJ SOLN
INTRAMUSCULAR | Status: AC
  Filled 2016-04-23: qty 30

## 2016-04-23 MED ORDER — TRANEXAMIC ACID 1000 MG/10ML IV SOLN
1000.0000 mg | INTRAVENOUS | Status: AC
  Administered 2016-04-23: 1000 mg via INTRAVENOUS
  Filled 2016-04-23: qty 1100

## 2016-04-23 MED ORDER — PHENOL 1.4 % MT LIQD: 1.0000 | OROMUCOSAL | Status: DC | PRN

## 2016-04-23 MED ORDER — DEXAMETHASONE SODIUM PHOSPHATE 10 MG/ML IJ SOLN
INTRAMUSCULAR | Status: DC | PRN
Start: 2016-04-23 — End: 2016-04-23
  Administered 2016-04-23: 10 mg via INTRAVENOUS

## 2016-04-23 MED ORDER — SODIUM CHLORIDE 0.9 % IR SOLN
Status: DC | PRN
  Administered 2016-04-23: 2000 mL

## 2016-04-23 MED ORDER — METHOCARBAMOL 500 MG PO TABS
500.0000 mg | ORAL_TABLET | Freq: Four times a day (QID) | ORAL | Status: DC | PRN
  Administered 2016-04-24 – 2016-04-26 (×9): 500 mg via ORAL
  Filled 2016-04-23 (×9): qty 1

## 2016-04-23 MED ORDER — MIDAZOLAM HCL 2 MG/2ML IJ SOLN
INTRAMUSCULAR | Status: AC
  Filled 2016-04-23: qty 2

## 2016-04-23 MED ORDER — METHOCARBAMOL 1000 MG/10ML IJ SOLN
500.0000 mg | Freq: Four times a day (QID) | INTRAVENOUS | Status: DC | PRN
  Administered 2016-04-23: 500 mg via INTRAVENOUS
  Filled 2016-04-23: qty 550
  Filled 2016-04-23: qty 5

## 2016-04-23 MED ORDER — LIDOCAINE-EPINEPHRINE 1 %-1:100000 IJ SOLN
INTRAMUSCULAR | Status: AC
  Filled 2016-04-23: qty 1

## 2016-04-23 MED ORDER — METHOCARBAMOL 500 MG PO TABS: 500.0000 mg | ORAL_TABLET | Freq: Four times a day (QID) | ORAL | 1 refills | Status: DC | PRN

## 2016-04-23 MED ORDER — DOCUSATE SODIUM 100 MG PO CAPS
100.0000 mg | ORAL_CAPSULE | Freq: Two times a day (BID) | ORAL | Status: DC
  Administered 2016-04-23 – 2016-04-26 (×6): 100 mg via ORAL
  Filled 2016-04-23 (×6): qty 1

## 2016-04-23 MED ORDER — CEFAZOLIN SODIUM-DEXTROSE 2-4 GM/100ML-% IV SOLN
2.0000 g | Freq: Four times a day (QID) | INTRAVENOUS | Status: AC
  Administered 2016-04-23 – 2016-04-24 (×2): 2 g via INTRAVENOUS
  Filled 2016-04-23 (×2): qty 100

## 2016-04-23 MED ORDER — METOCLOPRAMIDE HCL 5 MG PO TABS: 5.0000 mg | ORAL_TABLET | Freq: Three times a day (TID) | ORAL | Status: DC | PRN

## 2016-04-23 MED ORDER — ONDANSETRON HCL 4 MG PO TABS: 4.0000 mg | ORAL_TABLET | Freq: Four times a day (QID) | ORAL | Status: DC | PRN

## 2016-04-23 MED ORDER — SODIUM CHLORIDE 0.9 % IR SOLN
Status: DC | PRN
  Administered 2016-04-23: 500 mL

## 2016-04-23 MED ORDER — RIVAROXABAN 10 MG PO TABS
10.0000 mg | ORAL_TABLET | Freq: Every day | ORAL | Status: DC
  Administered 2016-04-24: 10 mg via ORAL
  Filled 2016-04-23 (×2): qty 1

## 2016-04-23 MED ORDER — SODIUM CHLORIDE 0.9 % IR SOLN
Status: AC
  Filled 2016-04-23: qty 500000

## 2016-04-23 MED ORDER — POLYETHYLENE GLYCOL 3350 17 G PO PACK: 17.0000 g | PACK | Freq: Every day | ORAL | 0 refills | Status: DC

## 2016-04-23 MED ORDER — HYDROMORPHONE HCL 1 MG/ML IJ SOLN
1.0000 mg | INTRAMUSCULAR | Status: DC | PRN
  Administered 2016-04-23 – 2016-04-26 (×5): 1 mg via INTRAVENOUS
  Filled 2016-04-23 (×5): qty 1

## 2016-04-23 SURGICAL SUPPLY — 58 items
BAG ZIPLOCK 12X15 (MISCELLANEOUS) IMPLANT
BANDAGE ACE 4X5 VEL STRL LF (GAUZE/BANDAGES/DRESSINGS) ×2 IMPLANT
BANDAGE ACE 6X5 VEL STRL LF (GAUZE/BANDAGES/DRESSINGS) ×2 IMPLANT
BLADE SAG 18X100X1.27 (BLADE) ×2 IMPLANT
BLADE SAW SGTL 13.0X1.19X90.0M (BLADE) ×2 IMPLANT
CAPT KNEE TOTAL 3 ATTUNE ×2 IMPLANT
CEMENT HV SMART SET (Cement) ×4 IMPLANT
CLOTH 2% CHLOROHEXIDINE 3PK (PERSONAL CARE ITEMS) ×2 IMPLANT
CUFF TOURN SGL QUICK 34 (TOURNIQUET CUFF) ×1
CUFF TRNQT CYL 34X4X40X1 (TOURNIQUET CUFF) ×1 IMPLANT
DECANTER SPIKE VIAL GLASS SM (MISCELLANEOUS) ×2 IMPLANT
DRAPE INCISE IOBAN 66X45 STRL (DRAPES) IMPLANT
DRAPE ORTHO SPLIT 77X108 STRL (DRAPES) ×2
DRAPE SHEET LG 3/4 BI-LAMINATE (DRAPES) ×2 IMPLANT
DRAPE SURG ORHT 6 SPLT 77X108 (DRAPES) ×2 IMPLANT
DRAPE U-SHAPE 47X51 STRL (DRAPES) ×2 IMPLANT
DRSG AQUACEL AG ADV 3.5X10 (GAUZE/BANDAGES/DRESSINGS) ×2 IMPLANT
DRSG TEGADERM 4X4.75 (GAUZE/BANDAGES/DRESSINGS) IMPLANT
DURAPREP 26ML APPLICATOR (WOUND CARE) ×2 IMPLANT
ELECT REM PT RETURN 9FT ADLT (ELECTROSURGICAL) ×2
ELECTRODE REM PT RTRN 9FT ADLT (ELECTROSURGICAL) ×1 IMPLANT
EVACUATOR 1/8 PVC DRAIN (DRAIN) IMPLANT
GAUZE SPONGE 2X2 8PLY STRL LF (GAUZE/BANDAGES/DRESSINGS) IMPLANT
GLOVE BIOGEL PI IND STRL 7.0 (GLOVE) ×2 IMPLANT
GLOVE BIOGEL PI IND STRL 7.5 (GLOVE) ×3 IMPLANT
GLOVE BIOGEL PI IND STRL 8 (GLOVE) ×1 IMPLANT
GLOVE BIOGEL PI INDICATOR 7.0 (GLOVE) ×2
GLOVE BIOGEL PI INDICATOR 7.5 (GLOVE) ×3
GLOVE BIOGEL PI INDICATOR 8 (GLOVE) ×1
GLOVE SURG SS PI 7.0 STRL IVOR (GLOVE) ×4 IMPLANT
GLOVE SURG SS PI 7.5 STRL IVOR (GLOVE) ×6 IMPLANT
GLOVE SURG SS PI 8.0 STRL IVOR (GLOVE) ×4 IMPLANT
GOWN STRL REUS W/TWL XL LVL3 (GOWN DISPOSABLE) ×8 IMPLANT
HANDPIECE INTERPULSE COAX TIP (DISPOSABLE) ×1
HEMOSTAT SPONGE AVITENE ULTRA (HEMOSTASIS) IMPLANT
IMMOBILIZER KNEE 20 (SOFTGOODS) ×2
IMMOBILIZER KNEE 20 THIGH 36 (SOFTGOODS) ×1 IMPLANT
MANIFOLD NEPTUNE II (INSTRUMENTS) ×2 IMPLANT
PACK TOTAL KNEE CUSTOM (KITS) ×2 IMPLANT
POSITIONER SURGICAL ARM (MISCELLANEOUS) ×2 IMPLANT
SET HNDPC FAN SPRY TIP SCT (DISPOSABLE) ×1 IMPLANT
SPONGE GAUZE 2X2 STER 10/PKG (GAUZE/BANDAGES/DRESSINGS)
SPONGE SURGIFOAM ABS GEL 100 (HEMOSTASIS) ×2 IMPLANT
STAPLER VISISTAT (STAPLE) ×2 IMPLANT
STRIP CLOSURE SKIN 1/2X4 (GAUZE/BANDAGES/DRESSINGS) IMPLANT
SUT BONE WAX W31G (SUTURE) IMPLANT
SUT MNCRL AB 4-0 PS2 18 (SUTURE) IMPLANT
SUT STRATAFIX 0 PDS 27 VIOLET (SUTURE) ×4
SUT VIC AB 1 CT1 27 (SUTURE) ×2
SUT VIC AB 1 CT1 27XBRD ANTBC (SUTURE) ×2 IMPLANT
SUT VIC AB 2-0 CT1 27 (SUTURE) ×3
SUT VIC AB 2-0 CT1 TAPERPNT 27 (SUTURE) ×3 IMPLANT
SUTURE STRATFX 0 PDS 27 VIOLET (SUTURE) ×2 IMPLANT
SYR 50ML LL SCALE MARK (SYRINGE) ×2 IMPLANT
TOWER CARTRIDGE SMART MIX (DISPOSABLE) ×2 IMPLANT
TRAY FOLEY W/METER SILVER 16FR (SET/KITS/TRAYS/PACK) ×2 IMPLANT
WRAP KNEE MAXI GEL POST OP (GAUZE/BANDAGES/DRESSINGS) ×2 IMPLANT
YANKAUER SUCT BULB TIP 10FT TU (MISCELLANEOUS) ×2 IMPLANT

## 2016-04-23 NOTE — Anesthesia Postprocedure Evaluation (Signed)
Anesthesia Post Note  Patient: [name]  Procedure(s) Performed: Procedure(s) (LRB): RIGHT TOTAL KNEE ARTHROPLASTY (Right)  Patient location during evaluation: PACU Anesthesia Type: Spinal Level of consciousness: awake Pain management: pain level controlled Vital Signs Assessment: post-procedure vital signs reviewed and stable Respiratory status: spontaneous breathing Cardiovascular status: stable Postop Assessment: spinal receding and no signs of nausea or vomiting Anesthetic complications: no    Last Vitals:  Vitals:   04/23/16 1750 04/23/16 1851  BP:  119/81  Pulse: 68 68  Resp: 15 15  Temp: 36.3 C     Last Pain:  Vitals:   04/23/16 1750  TempSrc: Oral  PainSc:                  Behr Cislo

## 2016-04-23 NOTE — Anesthesia Procedure Notes (Addendum)
Procedure Name: MAC Date/Time: 04/23/2016 1:15 PM Performed by: Wynonia SoursWALKER, Lendell Gallick L Pre-anesthesia Checklist: Patient identified, Timeout performed, Emergency Drugs available, Suction available and Patient being monitored Patient Re-evaluated:Patient Re-evaluated prior to inductionOxygen Delivery Method: Simple face mask Placement Confirmation: positive ETCO2 Dental Injury: Teeth and Oropharynx as per pre-operative assessment

## 2016-04-23 NOTE — Transfer of Care (Signed)
Immediate Anesthesia Transfer of Care Note  Patient: [name]  Procedure(s) Performed: Procedure(s): RIGHT TOTAL KNEE ARTHROPLASTY (Right)  Patient Location: PACU  Anesthesia Type:MAC and Spinal  Level of Consciousness: sedated and patient cooperative  Airway & Oxygen Therapy: Patient Spontanous Breathing and Patient connected to face mask oxygen  Post-op Assessment: Report given to RN and Post -op Vital signs reviewed and stable  Post vital signs: Reviewed and stable  Last Vitals:  Vitals:   04/23/16 1057  BP: 131/86  Pulse: 92  Resp: 16  Temp: 36.8 C    Last Pain:  Vitals:   04/23/16 1254  TempSrc:   PainSc: 2       Patients Stated Pain Goal: 4 (04/23/16 1254)  Complications: No apparent anesthesia complications

## 2016-04-23 NOTE — Interval H&P Note (Signed)
History and Physical Interval Note:  04/23/2016 12:54 PM  Vincent Ray  has presented today for surgery, with the diagnosis of right knee DJD  The various methods of treatment have been discussed with the patient and family. After consideration of risks, benefits and other options for treatment, the patient has consented to  Procedure(s): RIGHT TOTAL KNEE ARTHROPLASTY (Right) as a surgical intervention .  The patient's history has been reviewed, patient examined, no change in status, stable for surgery.  I have reviewed the patient's chart and labs.  Questions were answered to the patient's satisfaction.     Ilani Otterson C

## 2016-04-23 NOTE — Brief Op Note (Signed)
04/23/2016  3:27 PM  PATIENT:  Vincent Ray  60 y.o. male  PRE-OPERATIVE DIAGNOSIS:  right knee DJD  POST-OPERATIVE DIAGNOSIS:  right knee DJD  PROCEDURE:  Procedure(s): RIGHT TOTAL KNEE ARTHROPLASTY (Right)  SURGEON:  Surgeon(s) and Role:    * Jene EveryJeffrey Klaire Court, MD - Primary  PHYSICIAN ASSISTANT:   ASSISTANTS: Bissell   ANESTHESIA:   general  EBL:  Total I/O In: 2000 [I.V.:2000] Out: 125 [Urine:125]  BLOOD ADMINISTERED:none  DRAINS: none   LOCAL MEDICATIONS USED:  MARCAINE     SPECIMEN:  No Specimen  DISPOSITION OF SPECIMEN:  N/A  COUNTS:  YES  TOURNIQUET:  * Missing tourniquet times found for documented tourniquets in log:  [id number] * 74 min.  DICTATION: .Other Dictation: Dictation Number [id number]  PLAN OF CARE: Admit to inpatient   PATIENT DISPOSITION:  PACU - hemodynamically stable.   Delay start of Pharmacological VTE agent (>24hrs) due to surgical blood loss or risk of bleeding: no

## 2016-04-23 NOTE — Anesthesia Procedure Notes (Addendum)
Spinal  Patient location during procedure: OR Start time: 04/23/2016 1:15 PM End time: 04/23/2016 1:22 PM Reason for block: at surgeon's request Staffing Anesthesiologist: MOSER, CHRISTOPHER Resident/CRNA: Christell Faith L Performed: resident/CRNA  Preanesthetic Checklist Completed: patient identified, site marked, surgical consent, pre-op evaluation, timeout performed, IV checked, risks and benefits discussed and monitors and equipment checked Spinal Block Patient position: sitting Prep: DuraPrep Patient monitoring: heart rate, continuous pulse ox and blood pressure Approach: right paramedian Location: L3-4 Injection technique: single-shot Needle Needle type: Pencan  Needle gauge: 24 G Needle length: 9 cm Assessment Sensory level: T4 Additional Notes Expiration of kit checked and confirmed. Patient tolerated procedure well,without complications with noted clear CSF pre injection, mid-injection and post injection of bupivacaine. Loss of motor and sensory on exam post injection.

## 2016-04-23 NOTE — Anesthesia Preprocedure Evaluation (Signed)
Anesthesia Evaluation  Patient identified by MRN, date of birth, ID band Patient awake    Reviewed: Allergy & Precautions, NPO status , Patient's Chart, lab work & pertinent test results  History of Anesthesia Complications Negative for: history of anesthetic complications  Airway Mallampati: II  TM Distance: >3 FB Neck ROM: Full    Dental  (+) Teeth Intact   Pulmonary neg pulmonary ROS,    breath sounds clear to auscultation       Cardiovascular negative cardio ROS   Rhythm:Regular     Neuro/Psych negative neurological ROS  negative psych ROS   GI/Hepatic negative GI ROS, Neg liver ROS,   Endo/Other  negative endocrine ROS  Renal/GU negative Renal ROS     Musculoskeletal  (+) Arthritis ,   Abdominal   Peds  Hematology negative hematology ROS (+)   Anesthesia Other Findings   Reproductive/Obstetrics                             Anesthesia Physical Anesthesia Plan  ASA: I  Anesthesia Plan: Spinal   Post-op Pain Management:    Induction:   Airway Management Planned: Natural Airway, Nasal Cannula and Simple Face Mask  Additional Equipment: None  Intra-op Plan:   Post-operative Plan:   Informed Consent: I have reviewed the patients History and Physical, chart, labs and discussed the procedure including the risks, benefits and alternatives for the proposed anesthesia with the patient or authorized representative who has indicated his/her understanding and acceptance.   Dental advisory given  Plan Discussed with: CRNA and Surgeon  Anesthesia Plan Comments:         Anesthesia Quick Evaluation

## 2016-04-23 NOTE — H&P (View-Only) (Signed)
Dink K Particia Jasper DOB: November 14, 1955 Separated / Language: [place] / Race: Black or African American Male  H&P Date: 04/13/16  Chief Complaint: Right knee pain  History of Present Illness The patient is a 60 year old male who comes in today for a preoperative History and Physical. The patient is scheduled for a right total knee arthroplasty to be performed by Dr. Javier Docker, MD at [facility] on 04/23/16. [name] reports chronic pain R knee refractory to multiple injections, quad strengthening, bracing, HEP, medications. At this point pain is interfering with ADLs and he desires to proceed with surgery (total knee replacement). He is also anticipating R ankle/foot surgery with Dr. Victorino Dike early next year.  Dr. Shelle Iron and the patient mutually agreed to proceed with a right total knee replacement. Risks and benefits of the procedure were discussed including stiffness, suboptimal range of motion, persistent pain, infection requiring removal of prosthesis and reinsertion, need for prophylactic antibiotics in the future, for example, dental procedures, possible need for manipulation, revision in the future and also anesthetic complications including DVT, PE, etc. We discussed the perioperative course, time in the hospital, postoperative recovery and the need for elevation to control swelling. We also discussed the predicted range of motion and the probability that squatting and kneeling would be unobtainable in the future. In addition, postoperative anticoagulation was discussed. We have obtained preoperative medical clearance as necessary. Provided her illustrated handout and discussed it in detail. They will enroll in the total joint replacement educational forum at the hospital. He had his [facility] pre-op appt today.  Problem List/Past Medical Hx Primary osteoarthritis of right knee (M17.11)  Chronic pain of right knee (M25.561)  Bimalleolar fracture of right ankle (H47.425Z)  No pertinent  past medical history   Allergies  No Known Drug Allergies [11/06/2014]:  Family History Family history unknown - Adopted  First Degree Relatives  reported  Social History Tobacco use  Never smoker. 11/06/2014 Current work status  working full time Children  1 Tobacco / smoke exposure  11/06/2014: no Not under pain contract  No history of drug/alcohol rehab  Current drinker  11/06/2014: Currently drinks beer only occasionally per week Exercise  Exercises rarely; does gym / weights Number of flights of stairs before winded  2-3 Marital status  married Living situation  live with spouse  Medication History Aspirin (81MG  Tablet, Oral) Active. Medications Reconciled  Past Surgical History Arthroscopy of Knee  left Other Orthopaedic Surgery  [facility]IF 10/2014 R ankle  Review of Systems General Not Present- Chills, Fatigue, Fever, Memory Loss, Night Sweats, Weight Gain and Weight Loss. Skin Not Present- Eczema, Hives, Itching, Lesions and Rash. HEENT Not Present- Dentures, Double Vision, Headache, Hearing Loss, Tinnitus and Visual Loss. Respiratory Not Present- Allergies, Chronic Cough, Coughing up blood, Shortness of breath at rest and Shortness of breath with exertion. Cardiovascular Not Present- Chest Pain, Difficulty Breathing Lying Down, Murmur, Palpitations, Racing/skipping heartbeats and Swelling. Gastrointestinal Not Present- Abdominal Pain, Bloody Stool, Constipation, Diarrhea, Difficulty Swallowing, Heartburn, Jaundice, Loss of appetitie, Nausea and Vomiting. Male Genitourinary Not Present- Blood in Urine, Discharge, Flank Pain, Incontinence, Painful Urination, Urgency, Urinary frequency, Urinary Retention, Urinating at Night and Weak urinary stream. Musculoskeletal Present- Joint Pain, Joint Stiffness, Joint Swelling and Morning Stiffness. Not Present- Back Pain, Muscle Pain, Muscle Weakness and Spasms. Neurological Not Present- Blackout spells, Difficulty  with balance, Dizziness, Paralysis, Tremor and Weakness. Psychiatric Not Present- Insomnia.  Physical Exam General Mental Status -Alert, cooperative and good historian. General Appearance-pleasant, Not  in acute distress. Orientation-Oriented X3. Build & Nutrition-Well nourished and Well developed.  Head and Neck Head-normocephalic, atraumatic . Neck Global Assessment - supple, no bruit auscultated on the right, no bruit auscultated on the left.  Eye Pupil - Bilateral-Regular and Round. Motion - Bilateral-EOMI.  Chest and Lung Exam Auscultation Breath sounds - clear at anterior chest wall and clear at posterior chest wall. Adventitious sounds - No Adventitious sounds.  Cardiovascular Auscultation Rhythm - Regular rate and rhythm. Heart Sounds - S1 WNL and S2 WNL. Murmurs & Other Heart Sounds - Auscultation of the heart reveals - No Murmurs.  Abdomen Palpation/Percussion Tenderness - Abdomen is non-tender to palpation. Rigidity (guarding) - Abdomen is soft. Auscultation Auscultation of the abdomen reveals - Bowel sounds normal.  Male Genitourinary Not done, not pertinent to present illness  Musculoskeletal Note: He is well-nourished, well-developed, awake, alert and oriented x3. No acute distress. He has an antalgic gait with a varus thrust of the right knee. On examination of the right knee, trace effusion. He is tender to palpation in the medial joint space. Nontender lateral joint space, patella, patellar tendon, quad tendon, fibular head, peroneal nerve, popliteal space. No calf pain or sign of DVT. No pain or laxity with varus or valgus stress. He does have a slight flexion contracture at this point, maybe -3 degrees to 115 degrees of flexion. No pain or laxity with varus or valgus stress. No instability. Mild patellofemoral crepitus noted.  Imaging X-rays ordered, obtained and reviewed today with end-stage right knee osteoarthritis, medial joint spaces  bone-on-bone with a varus deformity. He does have some degenerative changes of the left knee as well, moderate to moderately severe medial joint space narrowing, not yet bone-on-bone with less of a varus alignment.  Assessment & Plan Chronic pain of right knee (M25.561, G89.29) Primary osteoarthritis of right knee (M17.11) Current Plans Follow up in 3 weeks or as needed The patient will need X-Rays at their next visit. Pt Education - Ice Therapy: ice therapy  Pt with end-stage right knee DJD, bone-on-bone, refractory to conservative tx, scheduled for right total knee replacement by Dr. Beane on 04/23/16. We again discussed the procedure itself as well as risks, complications and alternatives, including but not limited to DVT, PE, infx, bleeding, failure of procedure, need for secondary procedure including manipulation, nerve injury, ongoing pain/symptoms, anesthesia risk, even stroke or death. Also discussed typical post-op protocols, activity restrictions, need for PT, flexion/extension exercises, time out of work. Discussed need for DVT ppx post-op with Xarelto then ASA per protocol. Discussed dental ppx. Also discussed limitations post-operatively such as kneeling and squatting. All questions were answered. Patient desires to proceed with surgery as scheduled. Will hold ASA, supplements and NSAIDs accordingly. Will remain NPO after MN night before surgery. Will present to [facility] for pre-op testing. Plan Xarelto 2 weeks post-op for DVT ppx then ASA. Plan Percocet, Robaxin, Colace. Plan home with HHPT post-op with family members at home for assistance then outpt PT. Will follow up 10-14 days post-op for staple removal and xrays.  Plan right total knee replacement  Signed electronically by Jacory Kamel M Dorla Guizar, PA-C for Dr. Beane  

## 2016-04-24 LAB — CBC
HCT: 37.9 % — ABNORMAL LOW (ref 39.0–52.0)
HEMOGLOBIN: 13 g/dL (ref 13.0–17.0)
MCH: 30.4 pg (ref 26.0–34.0)
MCHC: 34.3 g/dL (ref 30.0–36.0)
MCV: 88.6 fL (ref 78.0–100.0)
Platelets: 262 10*3/uL (ref 150–400)
RBC: 4.28 MIL/uL (ref 4.22–5.81)
RDW: 13.5 % (ref 11.5–15.5)
WBC: 18 10*3/uL — ABNORMAL HIGH (ref 4.0–10.5)

## 2016-04-24 LAB — BASIC METABOLIC PANEL
ANION GAP: 6 (ref 5–15)
BUN: 13 mg/dL (ref 6–20)
CHLORIDE: 102 mmol/L (ref 101–111)
CO2: 27 mmol/L (ref 22–32)
Calcium: 8.2 mg/dL — ABNORMAL LOW (ref 8.9–10.3)
Creatinine, Ser: 1.09 mg/dL (ref 0.61–1.24)
GFR calc non Af Amer: >60 mL/min (ref >60)
Glucose, Bld: 148 mg/dL — ABNORMAL HIGH (ref 65–99)
POTASSIUM: 4.3 mmol/L (ref 3.5–5.1)
SODIUM: 135 mmol/L (ref 135–145)

## 2016-04-24 NOTE — Progress Notes (Signed)
Physical Therapy Treatment Patient Details Name: Vincent Ray MRN: [id number] DOB: 11/13/1955 Today's Date: 04/24/2016    History of Present Illness s/p R TKA    PT Comments    Progressing well  Follow Up Recommendations  Home health PT     Equipment Recommendations  None recommended by PT    Recommendations for Other Services       Precautions / Restrictions Precautions Precautions: Fall;Knee Required Braces or Orthoses: Knee Immobilizer - Right Restrictions Weight Bearing Restrictions: No RLE Weight Bearing: Weight bearing as tolerated    Mobility  Bed Mobility Overal bed mobility: Needs Assistance Bed Mobility: Supine to Sit;Sit to Supine     Supine to sit: Min assist Sit to supine: Min assist   General bed mobility comments: with RLE  Transfers Overall transfer level: Needs assistance Equipment used: Rolling walker (2 wheeled) Transfers: Sit to/from Stand Sit to Stand: Min guard         General transfer comment: cues for hand placement and LE management.  Ambulation/Gait Ambulation/Gait assistance: Min guard Ambulation Distance (Feet): 120 Feet Assistive device: Rolling walker (2 wheeled) Gait Pattern/deviations: Step-to pattern     General Gait Details: cues for sequence and RW position    Stairs            Wheelchair Mobility    Modified Rankin (Stroke Patients Only)       Balance                                    Cognition Arousal/Alertness: Awake/alert Behavior During Therapy: WFL for tasks assessed/performed Overall Cognitive Status: Within Functional Limits for tasks assessed                      Exercises Total Joint Exercises Ankle Circles/Pumps: AROM;Both;10 reps Quad Sets: 10 reps;Both;AROM Heel Slides: AROM;Right;10 reps Hip ABduction/ADduction: AAROM;Right;10 reps Straight Leg Raises: AAROM;Right;10 reps Goniometric ROM: -10 to 50*    General Comments        Pertinent  Vitals/Pain Pain Assessment: 0-10 Pain Score: 5  Pain Location: R knee Pain Descriptors / Indicators: Aching;Sore Pain Intervention(s): Limited activity within patient's tolerance;Monitored during session;Repositioned    Home Living Family/patient expects to be discharged to:: Private residence Living Arrangements: Spouse/significant other Available Help at Discharge: Available PRN/intermittently Type of Home: House Home Access: Stairs to enter   Home Layout: One level Home Equipment: Environmental consultantWalker - 2 wheels;Crutches      Prior Function Level of Independence: Independent          PT Goals (current goals can now be found in the care plan section) Acute Rehab PT Goals Patient Stated Goal: increase independence. PT Goal Formulation: With patient Time For Goal Achievement: 05/01/16 Potential to Achieve Goals: Good Progress towards PT goals: Progressing toward goals    Frequency    7X/week      PT Plan Current plan remains appropriate    Co-evaluation             End of Session Equipment Utilized During Treatment: Gait belt;Right knee immobilizer Activity Tolerance: Patient tolerated treatment well Patient left: in bed;with call bell/phone within reach;with family/visitor present (ortho tech)     Time: [id number] PT Time Calculation (min) (ACUTE ONLY): 29 min  Charges:  $Gait Training: 8-22 mins $Therapeutic Exercise: 8-22 mins  G CodesDrucilla Chalet 04/24/2016, 2:50 PM

## 2016-04-24 NOTE — Evaluation (Signed)
Occupational Therapy Evaluation Patient Details Name: Vincent Ray MRN: [id number] DOB: [birth year] Today's Date: 04/24/2016    History of Present Illness s/p R TKA   Clinical Impression   Pt doing well but has limited assist especially in am at home. Recommend HHOT to continue education and increase independence and ensure safety at home. Will follow to progress safety and safe ADLs while on acute.    Follow Up Recommendations  Home health OT    Equipment Recommendations  3 in 1 bedside comode    Recommendations for Other Services       Precautions / Restrictions Precautions Precautions: Fall;Knee Required Braces or Orthoses: Knee Immobilizer - Right Restrictions Weight Bearing Restrictions: No RLE Weight Bearing: Weight bearing as tolerated      Mobility Bed Mobility      General bed mobility comments: in chair.   Transfers Overall transfer level: Needs assistance Equipment used: Rolling walker (2 wheeled) Transfers: Sit to/from Stand Sit to Stand: Min guard         General transfer comment: cues for hand placement and LE management.    Balance                                            ADL Overall ADL's : Needs assistance/impaired Eating/Feeding: Independent;Sitting   Grooming: Wash/dry hands;Set up;Sitting   Upper Body Bathing: Set up;Sitting   Lower Body Bathing: Moderate assistance;Sit to/from stand   Upper Body Dressing : Set up;Sitting   Lower Body Dressing: Moderate assistance;Sit to/from stand   Toilet Transfer: Minimal assistance;Ambulation;BSC;RW   Toileting- Clothing Manipulation and Hygiene: Minimal assistance;Sit to/from stand         General ADL Comments: Discussed use of 3in1 as shower chair and use over toilet as he has a standard height commode. He has limited help at home as wife is not home until 3:30 pm. Therefore discussed AE option to help with LB dressing as he states his wife may not be  there when he gets dressed. Demonstrated all AE pieces.      Vision     Perception     Praxis      Pertinent Vitals/Pain Pain Assessment: 0-10 Pain Score: 4  Pain Location: R LE  Pain Descriptors / Indicators: Aching Pain Intervention(s): Ice applied;Monitored during session     Hand Dominance     Extremity/Trunk Assessment Upper Extremity Assessment Upper Extremity Assessment: Overall WFL for tasks assessed          Communication Communication Communication: No difficulties   Cognition Arousal/Alertness: Awake/alert Behavior During Therapy: WFL for tasks assessed/performed Overall Cognitive Status: Within Functional Limits for tasks assessed                     General Comments       Exercises       Shoulder Instructions      Home Living Family/patient expects to be discharged to:: Private residence Living Arrangements: Spouse/significant other Available Help at Discharge: Available PRN/intermittently Type of Home: House Home Access: Stairs to enter [employer] of Steps: 1   Home Layout: One level     Bathroom Shower/Tub: Producer, television/film/video: Standard     Home Equipment: Environmental consultant - 2 wheels;Crutches          Prior Functioning/Environment Level of Independence: Independent  OT Problem List: Decreased strength;Decreased knowledge of use of DME or AE   OT Treatment/Interventions: Self-care/ADL training;Patient/family education;Therapeutic activities;DME and/or AE instruction    OT Goals(Current goals can be found in the care plan section) Acute Rehab OT Goals Patient Stated Goal: increase independence. OT Goal Formulation: With patient Time For Goal Achievement: 05/01/16 Potential to Achieve Goals: Good  OT Frequency: Min 2X/week   Barriers to D/C:            Co-evaluation              End of Session Equipment Utilized During Treatment: Rolling walker;Right knee  immobilizer  Activity Tolerance: Patient tolerated treatment well Patient left: in chair;with call bell/phone within reach   Time: 1105-1130 OT Time Calculation (min): 25 min Charges:  OT General Charges $OT Visit: 1 Procedure OT Evaluation $OT Eval Low Complexity: 1 Procedure OT Treatments $Therapeutic Activity: 8-22 mins G-Codes:    Lennox LaityStone, Anacristina Steffek Stafford 04/24/2016, 2:16 PM

## 2016-04-24 NOTE — Evaluation (Signed)
Physical Therapy Evaluation Patient Details Name: Vincent PoliteClifford Whitney-Taylor MRN: [id number] DOB: 02-05-1956 Today's Date: 04/24/2016   History of Present Illness  s/p R TKA  Clinical Impression  Pt is s/p TKA resulting in the deficits listed below (see PT Problem List). * Pt will benefit from skilled PT to increase their independence and safety with mobility to allow discharge to the venue listed below.       Follow Up Recommendations Home health PT    Equipment Recommendations  None recommended by PT    Recommendations for Other Services       Precautions / Restrictions Precautions Precautions: Fall;Knee Required Braces or Orthoses: Knee Immobilizer - Right Restrictions RLE Weight Bearing: Weight bearing as tolerated      Mobility  Bed Mobility Overal bed mobility: Needs Assistance Bed Mobility: Supine to Sit     Supine to sit: Min assist;HOB elevated     General bed mobility comments: with RLE  Transfers Overall transfer level: Needs assistance Equipment used: Rolling walker (2 wheeled) Transfers: Sit to/from Stand Sit to Stand: Min guard;From elevated surface         General transfer comment: cues for hand palcement and RLE position  Ambulation/Gait Ambulation/Gait assistance: Min assist Ambulation Distance (Feet): 80 Feet Assistive device: Rolling walker (2 wheeled) Gait Pattern/deviations: Step-to pattern;Antalgic;Decreased step length - right;Decreased step length - left     General Gait Details: cues for sequence and RW position   Stairs            Wheelchair Mobility    Modified Rankin (Stroke Patients Only)       Balance                                             Pertinent Vitals/Pain Pain Assessment: 0-10 Pain Score: 2  Pain Location: right Pain Descriptors / Indicators: Aching;Sore Pain Intervention(s): Limited activity within patient's tolerance;Monitored during session;Premedicated before session     Home Living Family/patient expects to be discharged to:: Private residence Living Arrangements: Spouse/significant other Available Help at Discharge: Available PRN/intermittently Type of Home: House Home Access: Stairs to enter   Secretary/administratorntrance Stairs-Number of Steps: 1 Home Layout: One level Home Equipment: Environmental consultantWalker - 2 wheels;Crutches      Prior Function Level of Independence: Independent               Hand Dominance        Extremity/Trunk Assessment   Upper Extremity Assessment: Defer to OT evaluation           Lower Extremity Assessment: RLE deficits/detail RLE Deficits / Details: ankle WFL, knee extension and hip flexion 2-/5; limited by post op pain and weakness       Communication   Communication: No difficulties  Cognition Arousal/Alertness: Awake/alert Behavior During Therapy: WFL for tasks assessed/performed Overall Cognitive Status: Within Functional Limits for tasks assessed                      General Comments      Exercises Total Joint Exercises Ankle Circles/Pumps: AROM;Both;10 reps Quad Sets: 10 reps;Both;AROM Heel Slides: AROM;Right;10 reps   Assessment/Plan    PT Assessment Patient needs continued PT services  PT Problem List Decreased strength;Decreased range of motion;Decreased activity tolerance;Decreased mobility;Decreased knowledge of use of DME;Pain;Decreased knowledge of precautions          PT Treatment Interventions DME  instruction;Gait training;Functional mobility training;Stair training;Therapeutic activities;Therapeutic exercise;Patient/family education    PT Goals (Current goals can be found in the Care Plan section)  Acute Rehab PT Goals Patient Stated Goal: less pain PT Goal Formulation: With patient Time For Goal Achievement: 05/01/16 Potential to Achieve Goals: Good    Frequency 7X/week   Barriers to discharge        Co-evaluation               End of Session Equipment Utilized During  Treatment: Gait belt;Right knee immobilizer Activity Tolerance: Patient tolerated treatment well Patient left: with call bell/phone within reach;in chair;with chair alarm set;with family/visitor present           Time: 1011-1045 PT Time Calculation (min) (ACUTE ONLY): 34 min   Charges:   PT Evaluation $PT Eval Low Complexity: 1 Procedure PT Treatments $Gait Training: 8-22 mins   PT G Codes:        Reem Fleury Apr 29, 2016, 1:57 PM

## 2016-04-24 NOTE — Progress Notes (Signed)
Vincent Ray  MRN: [id number] DOB/Age: 1955/10/11 60 y.o. Physician: Lynnea Maizes, M.D. 1 Day Post-Op Procedure(s) (LRB): RIGHT TOTAL KNEE ARTHROPLASTY (Right)  Subjective: Resting comfortably, no N/V, pain meds tolerated. Vital Signs Temp:  [97.4 F (36.3 C)-98.5 F (36.9 C)] 98.4 F (36.9 C) (12/02 0600) Pulse Rate:  [64-100] 96 (12/02 0600) Resp:  [12-20] 20 (12/02 0600) BP: (105-136)/(74-93) 121/82 (12/02 0600) SpO2:  [97 %-100 %] 99 % (12/02 0600) Weight:  [97.5 kg (215 lb)] 97.5 kg (215 lb) (12/01 1750)  Lab Results  Recent Labs  04/24/16 0448  WBC 18.0*  HGB 13.0  HCT 37.9*  PLT 262   BMET  Recent Labs  04/24/16 0448  NA 135  K 4.3  CL 102  CO2 27  GLUCOSE 148*  BUN 13  CREATININE 1.09  CALCIUM 8.2*   INR  Date Value Ref Range Status  04/13/2016 0.97  Final     Exam  Ace wraps removed, skin intact, acquacel clean and dry, fair ankle motion, n/v intact distally  Plan Mobilize w PT, TKA protocol Ruger Saxer M Surabhi Gadea 04/24/2016, 9:07 AM    Contact # [phone]

## 2016-04-24 NOTE — Discharge Instructions (Signed)
Elevate leg above heart 6x a day for 20minutes each °Use knee immobilizer while walking until can SLR x 10 °Use knee immobilizer in bed to keep knee in extension °Aquacel dressing may remain in place until follow up. May shower with aquacel dressing in place. If the dressing becomes saturated or peels off, you may remove aquacel dressing. Place new dressing with gauze and tape or ACE bandage which should be kept clean and dry and changed daily. ° °INSTRUCTIONS AFTER JOINT REPLACEMENT  ° °o Remove items at home which could result in a fall. This includes throw rugs or furniture in walking pathways °o ICE to the affected joint every three hours while awake for 30 minutes at a time, for at least the first 3-5 days, and then as needed for pain and swelling.  Continue to use ice for pain and swelling. You may notice swelling that will progress down to the foot and ankle.  This is normal after surgery.  Elevate your leg when you are not up walking on it.   °o Continue to use the breathing machine you got in the hospital (incentive spirometer) which will help keep your temperature down.  It is common for your temperature to cycle up and down following surgery, especially at night when you are not up moving around and exerting yourself.  The breathing machine keeps your lungs expanded and your temperature down. ° ° °DIET:  As you were doing prior to hospitalization, we recommend a well-balanced diet. ° °DRESSING / WOUND CARE / SHOWERING ° °Keep the surgical dressing until follow up.  The dressing is water proof, so you can shower without any extra covering.  IF THE DRESSING FALLS OFF or the wound gets wet inside, change the dressing with sterile gauze.  Please use good hand washing techniques before changing the dressing.  Do not use any lotions or creams on the incision until instructed by your surgeon.   ° °ACTIVITY ° °o Increase activity slowly as tolerated, but follow the weight bearing instructions below.   °o No  driving for 6 weeks or until further direction given by your physician.  You cannot drive while taking narcotics.  °o No lifting or carrying greater than 10 lbs. until further directed by your surgeon. °o Avoid periods of inactivity such as sitting longer than an hour when not asleep. This helps prevent blood clots.  °o You may return to work once you are authorized by your doctor.  ° ° ° °WEIGHT BEARING  ° °Weight bearing as tolerated with assist device (walker, cane, etc) as directed, use it as long as suggested by your surgeon or therapist, typically at least 4-6 weeks. ° ° °EXERCISES ° °Results after joint replacement surgery are often greatly improved when you follow the exercise, range of motion and muscle strengthening exercises prescribed by your doctor. Safety measures are also important to protect the joint from further injury. Any time any of these exercises cause you to have increased pain or swelling, decrease what you are doing until you are comfortable again and then slowly increase them. If you have problems or questions, call your caregiver or physical therapist for advice.  ° °Rehabilitation is important following a joint replacement. After just a few days of immobilization, the muscles of the leg can become weakened and shrink (atrophy).  These exercises are designed to build up the tone and strength of the thigh and leg muscles and to improve motion. Often times heat used for twenty to thirty minutes   before working out will loosen up your tissues and help with improving the range of motion but do not use heat for the first two weeks following surgery (sometimes heat can increase post-operative swelling).   These exercises can be done on a training (exercise) mat, on the floor, on a table or on a bed. Use whatever works the best and is most comfortable for you.    Use music or television while you are exercising so that the exercises are a pleasant break in your day. This will make your life  better with the exercises acting as a break in your routine that you can look forward to.   Perform all exercises about fifteen times, three times per day or as directed.  You should exercise both the operative leg and the other leg as well.  Exercises include:    Quad Sets - Tighten up the muscle on the front of the thigh (Quad) and hold for 5-10 seconds.    Straight Leg Raises - With your knee straight (if you were given a brace, keep it on), lift the leg to 60 degrees, hold for 3 seconds, and slowly lower the leg.  Perform this exercise against resistance later as your leg gets stronger.   Leg Slides: Lying on your back, slowly slide your foot toward your buttocks, bending your knee up off the floor (only go as far as is comfortable). Then slowly slide your foot back down until your leg is flat on the floor again.   Angel Wings: Lying on your back spread your legs to the side as far apart as you can without causing discomfort.   Hamstring Strength:  Lying on your back, push your heel against the floor with your leg straight by tightening up the muscles of your buttocks.  Repeat, but this time bend your knee to a comfortable angle, and push your heel against the floor.  You may put a pillow under the heel to make it more comfortable if necessary.   A rehabilitation program following joint replacement surgery can speed recovery and prevent re-injury in the future due to weakened muscles. Contact your doctor or a physical therapist for more information on knee rehabilitation.    CONSTIPATION  Constipation is defined medically as fewer than three stools per week and severe constipation as less than one stool per week.  Even if you have a regular bowel pattern at home, your normal regimen is likely to be disrupted due to multiple reasons following surgery.  Combination of anesthesia, postoperative narcotics, change in appetite and fluid intake all can affect your bowels.   YOU MUST use at least  one of the following options; they are listed in order of increasing strength to get the job done.  They are all available over the counter, and you may need to use some, POSSIBLY even all of these options:    Drink plenty of fluids (prune juice may be helpful) and high fiber foods Colace 100 mg by mouth twice a day  Senokot for constipation as directed and as needed Dulcolax (bisacodyl), take with full glass of water  Miralax (polyethylene glycol) once or twice a day as needed.  If you have tried all these things and are unable to have a bowel movement in the first 3-4 days after surgery call either your surgeon or your primary doctor.    If you experience loose stools or diarrhea, hold the medications until you stool forms back up.  If your symptoms do  not get better within 1 week or if they get worse, check with your doctor.  If you experience "the worst abdominal pain ever" or develop nausea or vomiting, please contact the office immediately for further recommendations for treatment.   ITCHING:  If you experience itching with your medications, try taking only a single pain pill, or even half a pain pill at a time.  You can also use Benadryl over the counter for itching or also to help with sleep.   TED HOSE STOCKINGS:  Use stockings on both legs until for at least 2 weeks or as directed by physician office. They may be removed at night for sleeping.  MEDICATIONS:  See your medication summary on the After Visit Summary that nursing will review with you.  You may have some home medications which will be placed on hold until you complete the course of blood thinner medication.  It is important for you to complete the blood thinner medication as prescribed.  PRECAUTIONS:  If you experience chest pain or shortness of breath - call 911 immediately for transfer to the hospital emergency department.   If you develop a fever greater that 101 F, purulent drainage from wound, increased redness or  drainage from wound, foul odor from the wound/dressing, or calf pain - CONTACT YOUR SURGEON.                                                   FOLLOW-UP APPOINTMENTS:  If you do not already have a post-op appointment, please call the office for an appointment to be seen by your surgeon.  Guidelines for how soon to be seen are listed in your After Visit Summary, but are typically between 1-4 weeks after surgery.  OTHER INSTRUCTIONS:   Knee Replacement:  Do not place pillow under knee, focus on keeping the knee straight while resting. CPM instructions: 0-90 degrees, 2 hours in the morning, 2 hours in the afternoon, and 2 hours in the evening. Place foam block, curve side up under heel at all times except when in CPM or when walking.  DO NOT modify, tear, cut, or change the foam block in any way.  MAKE SURE YOU:   Understand these instructions.   Get help right away if you are not doing well or get worse.    Thank you for letting us be a part of your medical care team.  It is a privilege we respect greatly.  We hope these instructions will help you stay on track for a fast and full recovery!    Information on my medicine - XARELTO (Rivaroxaban)  This medication education was reviewed with me or my healthcare representative as part of my discharge preparation.  The pharmacist that spoke with me during my hospital stay was:  Ulyses Amor, [facility]  Why was Xarelto prescribed for you? Xarelto was prescribed for you to reduce the risk of blood clots forming after orthopedic surgery. The medical term for these abnormal blood clots is venous thromboembolism (VTE).  What do you need to know about xarelto ? Take your Xarelto ONCE DAILY at the same time every day. You may take it either with or without food.  If you have difficulty swallowing the tablet whole, you may crush it and mix in applesauce just prior to taking your dose.  Take Xarelto exactly as prescribed  by your doctor and DO NOT stop  taking Xarelto without talking to the doctor who prescribed the medication.  Stopping without other VTE prevention medication to take the place of Xarelto may increase your risk of developing a clot.  After discharge, you should have regular check-up appointments with your healthcare provider that is prescribing your Xarelto.    What do you do if you miss a dose? If you miss a dose, take it as soon as you remember on the same day then continue your regularly scheduled once daily regimen the next day. Do not take two doses of Xarelto on the same day.   Important Safety Information A possible side effect of Xarelto is bleeding. You should call your healthcare provider right away if you experience any of the following: ? Bleeding from an injury or your nose that does not stop. ? Unusual colored urine (red or dark brown) or unusual colored stools (red or black). ? Unusual bruising for unknown reasons. ? A serious fall or if you hit your head (even if there is no bleeding).  Some medicines may interact with Xarelto and might increase your risk of bleeding while on Xarelto. To help avoid this, consult your healthcare provider or pharmacist prior to using any new prescription or non-prescription medications, including herbals, vitamins, non-steroidal anti-inflammatory drugs (NSAIDs) and supplements.  This website has more information on Xarelto: [email or web address].[email or web address].

## 2016-04-24 NOTE — Care Management Note (Signed)
Case Management Note  Patient Details  Name: Vincent Ray MRN: [id number] Date of Birth: 1956-02-17  Subjective/Objective:      Right TKA              Action/Plan: Discharge Planning: NCM spoke to pt and offered choice for [facility]. Pt preoperatively arranged with [facility]. Pt agreeable to [facility] for [facility]. Pt has RW at home. Requested 3n1 bedside commode. Contacted [facility] for DME for home. Wife at home to assist with care.    PCP Corwin Levins MD  Expected Discharge Date:  04/24/2016              Expected Discharge Plan:  Home w Home Health Services  In-House Referral:  NA  Discharge planning Services  CM Consult  Post Acute Care Choice:  Home Health Choice offered to:  Patient  DME Arranged:  3-N-1 DME Agency:  [facility]  HH Arranged:  PT HH Agency:  [facility] (formerly [facility]  Status of Service:  Completed, signed off  If discussed at [employer] of Stay Meetings, dates discussed:    Additional Comments:  Elliot Cousin, RN 04/24/2016, 11:29 AM

## 2016-04-25 LAB — CBC
HEMATOCRIT: 34.6 % — AB (ref 39.0–52.0)
HEMOGLOBIN: 11.8 g/dL — AB (ref 13.0–17.0)
MCH: 30.6 pg (ref 26.0–34.0)
MCHC: 34.1 g/dL (ref 30.0–36.0)
MCV: 89.9 fL (ref 78.0–100.0)
Platelets: 243 10*3/uL (ref 150–400)
RBC: 3.85 MIL/uL — AB (ref 4.22–5.81)
RDW: 13.9 % (ref 11.5–15.5)
WBC: 8.7 10*3/uL (ref 4.0–10.5)

## 2016-04-25 MED ORDER — ASPIRIN EC 81 MG PO TBEC
81.0000 mg | DELAYED_RELEASE_TABLET | Freq: Two times a day (BID) | ORAL | Status: DC
  Administered 2016-04-25 – 2016-04-26 (×3): 81 mg via ORAL
  Filled 2016-04-25 (×3): qty 1

## 2016-04-25 NOTE — Progress Notes (Signed)
Occupational Therapy Treatment Patient Details Name: Vincent PoliteClifford Ray MRN: [id number] DOB: Dec 26, 1955 Today's Date: 04/25/2016    History of present illness s/p R TKA   OT comments  Pt. And wife were ed on use of AE for performing LE ADLs. Pt. Was able to return demo use of reacher and sock donner. Pt. Requires Mod A to don socks, Mod A to don pants. And Mod A to don slip on shoes. Pt. Was Min A with supine to sit EOB. Pt. Was min guard assist with sit tos tand and stand to sit from bed. Pt. Was min guard assist with AMB into bathroom to perform shower transfer. Pt. Was Min  Guard assist with sit to stand and stand to sit from shower seat with use of grab bars. Pt. Required cues to kick out leg prior to sitting down and standing up.   Follow Up Recommendations  Home health OT    Equipment Recommendations  3 in 1 bedside comode    Recommendations for Other Services      Precautions / Restrictions Precautions Precautions: Fall;Knee Required Braces or Orthoses: Knee Immobilizer - Right Restrictions Weight Bearing Restrictions: No RLE Weight Bearing: Weight bearing as tolerated       Mobility Bed Mobility Overal bed mobility: Needs Assistance Bed Mobility: Supine to Sit;Sit to Supine     Supine to sit: Min assist Sit to supine: Min assist      Transfers     Transfers: Sit to/from Stand Sit to Stand: Min guard         General transfer comment: cues for hand placement and LE management.    Balance                                   ADL                       Lower Body Dressing: Moderate assistance;With adaptive equipment;Cueing for sequencing;Sit to/from stand                 General ADL Comments: Pt. ed on use of AE for performing LE dressing.       Vision                     Perception     Praxis      Cognition   Behavior During Therapy: WFL for tasks assessed/performed Overall Cognitive Status: Within  Functional Limits for tasks assessed                       Extremity/Trunk Assessment               Exercises     Shoulder Instructions       General Comments      Pertinent Vitals/ Pain       Pain Assessment: 0-10 Pain Score: 4  Pain Location:  (R knee) Pain Descriptors / Indicators: Aching;Discomfort Pain Intervention(s): Monitored during session  Home Living                                          Prior Functioning/Environment              Frequency  Min 2X/week        Progress Toward Goals  OT Goals(current goals can now be found in the care plan section)     Acute Rehab OT Goals Patient Stated Goal:  ("To move better and go home.)  Plan      Co-evaluation                 End of Session Equipment Utilized During Treatment: Gait belt;Rolling walker CPM Right Knee CPM Right Knee: Off   Activity Tolerance Patient tolerated treatment well   Patient Left in bed;with call bell/phone within reach;with family/visitor present   Nurse Communication          Time: 1010-1056 OT Time Calculation (min): 46 min  Charges: OT General Charges $OT Visit: 1 Procedure OT Treatments $Self Care/Home Management : 38-52 mins  Carolyn Sylvia 04/25/2016, 11:10 AM

## 2016-04-25 NOTE — Progress Notes (Signed)
Patient ID: Vincent Ray, male   DOB: 05-21-1956, 60 y.o.   MRN: [id number] Subjective: 2 Days Post-Op Procedure(s) (LRB): RIGHT TOTAL KNEE ARTHROPLASTY (Right) Patient reports pain as 8 on 0-10 scale.    Patient has complaints of R knee pain and swelling. Voiding without c/o. No N/V. Asking for dilaudid. Not ready to go home yet.  Objective: Vital signs in last 24 hours: Temp:  [97.5 F (36.4 C)-100 F (37.8 C)] 98.8 F (37.1 C) (12/03 0555) Pulse Rate:  [95-106] 102 (12/03 0555) Resp:  [15-16] 16 (12/03 0555) BP: (112-135)/(70-84) 113/74 (12/03 0555) SpO2:  [90 %-96 %] 93 % (12/03 0555)  Intake/Output from previous day:  Intake/Output Summary (Last 24 hours) at 04/25/16 0826 Last data filed at 04/25/16 0600  Gross per 24 hour  Intake          1033.33 ml  Output              560 ml  Net           473.33 ml    Intake/Output this shift: No intake/output data recorded.  Labs: Results for orders placed or performed during the hospital encounter of 04/23/16  CBC  Result Value Ref Range   WBC 18.0 (H) 4.0 - 10.5 K/uL   RBC 4.28 4.22 - 5.81 MIL/uL   Hemoglobin 13.0 13.0 - 17.0 g/dL   HCT 40.9 (L) 81.1 - 91.4 %   MCV 88.6 78.0 - 100.0 fL   MCH 30.4 26.0 - 34.0 pg   MCHC 34.3 30.0 - 36.0 g/dL   RDW 78.2 95.6 - 21.3 %   Platelets 262 150 - 400 K/uL  Basic metabolic panel  Result Value Ref Range   Sodium 135 135 - 145 mmol/L   Potassium 4.3 3.5 - 5.1 mmol/L   Chloride 102 101 - 111 mmol/L   CO2 27 22 - 32 mmol/L   Glucose, Bld 148 (H) 65 - 99 mg/dL   BUN 13 6 - 20 mg/dL   Creatinine, Ser 0.86 0.61 - 1.24 mg/dL   Calcium 8.2 (L) 8.9 - 10.3 mg/dL   GFR calc non Af Amer >60 >60 mL/min   GFR calc Af Amer >60 >60 mL/min   Anion gap 6 5 - 15  CBC  Result Value Ref Range   WBC 8.7 4.0 - 10.5 K/uL   RBC 3.85 (L) 4.22 - 5.81 MIL/uL   Hemoglobin 11.8 (L) 13.0 - 17.0 g/dL   HCT 57.8 (L) 46.9 - 62.9 %   MCV 89.9 78.0 - 100.0 fL   MCH 30.6 26.0 - 34.0 pg   MCHC 34.1  30.0 - 36.0 g/dL   RDW 52.8 41.3 - 24.4 %   Platelets 243 150 - 400 K/uL    Exam - Neurologically intact ABD soft Neurovascular intact Sensation intact distally Intact pulses distally Dorsiflexion/Plantar flexion intact Incision: dressing C/D/I and no drainage No cellulitis present Compartment soft no calf pain, negative Homan's sign Dressing/Incision - clean, dry, no drainage Motor function intact - moving foot and toes well on exam.  Increased swelling RLE since yesterday Diffusely tender about R knee No ecchymosis noted No sign of infx  Assessment/Plan: 2 Days Post-Op Procedure(s) (LRB): RIGHT TOTAL KNEE ARTHROPLASTY (Right)  Advance diet Up with therapy D/C IV fluids Past Medical History:  Diagnosis Date  . Arthritis   . Asthma    As a child no problems now    DVT Prophylaxis - start ASA 81mg  BID today. Hold Xarelto due  to increased swelling, possible bleed Weight-Bearing as tolerated to  Leg Ace wrap, knee immobilizer today to avoid flexion. PT to hold flexion based exercises, keep in immobilizer and limit time on his feet to avoid swelling Toes above nose elevation with ice D/C CPM No sitting in chair for long periods to avoid swelling - keep toes above the nose Discussed with both Dr. Shelle IronBeane and Dr. Linna CapriceSwinteck Will recheck swelling tomorrow and if pain and swelling improved anticipate D/C on ASA for DVT ppx Pt's nurse Ladona Ridgel(Taylor) aware of the above plan  Shakora Nordquist M. 04/25/2016, 8:26 AM

## 2016-04-25 NOTE — Progress Notes (Signed)
Physical Therapy Treatment Patient Details Name: Kathee PoliteClifford Whitney-Taylor MRN: [id number] DOB: Aug 15, 1955 Today's Date: 04/25/2016    History of Present Illness s/p R TKA    PT Comments    Limited session d/t ortho PA's orders--no sitting in chair, no knee flexion, etc. Pt RLE does not appear significantly edematous to this PT compared to previous day; pt's pain is well controlled during gait; may  not see for  a second session  since we are limited to amb and pt is doing quite well with this aspect of mobility   Follow Up Recommendations  Home health PT     Equipment Recommendations  None recommended by PT    Recommendations for Other Services       Precautions / Restrictions Precautions Precautions: Fall;Knee Required Braces or Orthoses: Knee Immobilizer - Right Restrictions Weight Bearing Restrictions: No RLE Weight Bearing: Weight bearing as tolerated    Mobility  Bed Mobility Overal bed mobility: Needs Assistance Bed Mobility: Supine to Sit;Sit to Supine     Supine to sit: Min assist Sit to supine: Min assist   General bed mobility comments: with RLE  Transfers Overall transfer level: Needs assistance Equipment used: Rolling walker (2 wheeled) Transfers: Sit to/from Stand Sit to Stand: From elevated surface;Min guard         General transfer comment: cues for hand placement and LE management.  Ambulation/Gait Ambulation/Gait assistance: Min guard;Supervision Ambulation Distance (Feet): 170 Feet Assistive device: Rolling walker (2 wheeled) Gait Pattern/deviations: Step-to pattern;Step-through pattern;Decreased weight shift to right     General Gait Details: cues for sequence and RW position    Stairs            Wheelchair Mobility    Modified Rankin (Stroke Patients Only)       Balance                                    Cognition Arousal/Alertness: Awake/alert Behavior During Therapy: WFL for tasks  assessed/performed Overall Cognitive Status: Within Functional Limits for tasks assessed                      Exercises Total Joint Exercises Ankle Circles/Pumps: AROM;Both;10 reps    General Comments        Pertinent Vitals/Pain Pain Assessment: 0-10 Pain Score: 5  Pain Location: R knee Pain Descriptors / Indicators: Sore;Grimacing Pain Intervention(s): Limited activity within patient's tolerance;Monitored during session;Premedicated before session;Repositioned;Ice applied    Home Living                      Prior Function            PT Goals (current goals can now be found in the care plan section) Acute Rehab PT Goals Patient Stated Goal: increase independence. PT Goal Formulation: With patient Time For Goal Achievement: 05/01/16 Potential to Achieve Goals: Good Progress towards PT goals: Progressing toward goals    Frequency    7X/week      PT Plan Current plan remains appropriate    Co-evaluation             End of Session           Time: 0940-1005 PT Time Calculation (min) (ACUTE ONLY): 25 min  Charges:  $Gait Training: 23-37 mins  G CodesDrucilla Chalet 04/25/2016, 11:34 AM

## 2016-04-26 ENCOUNTER — Encounter (HOSPITAL_COMMUNITY): Payer: Self-pay | Admitting: Specialist

## 2016-04-26 LAB — CBC
HCT: 34.4 % — ABNORMAL LOW (ref 39.0–52.0)
Hemoglobin: 11.8 g/dL — ABNORMAL LOW (ref 13.0–17.0)
MCH: 31.1 pg (ref 26.0–34.0)
MCHC: 34.3 g/dL (ref 30.0–36.0)
MCV: 90.5 fL (ref 78.0–100.0)
PLATELETS: 256 10*3/uL (ref 150–400)
RBC: 3.8 MIL/uL — AB (ref 4.22–5.81)
RDW: 14.1 % (ref 11.5–15.5)
WBC: 9.2 10*3/uL (ref 4.0–10.5)

## 2016-04-26 MED ORDER — ASPIRIN 81 MG PO TBEC: 81.0000 mg | DELAYED_RELEASE_TABLET | Freq: Two times a day (BID) | ORAL | 1 refills | Status: DC

## 2016-04-26 NOTE — Discharge Summary (Signed)
Physician Discharge Summary   Patient ID: Dick Hark MRN: [id number] DOB/AGE: [birth year] 60 y.o.  Admit date: 04/23/2016 Discharge date:   Primary Diagnosis: right knee primary osteoarthritis  Admission Diagnoses:  Past Medical History:  Diagnosis Date  . Arthritis   . Asthma    As a child no problems now   Discharge Diagnoses:   Principal Problem:   Primary osteoarthritis of right knee  Estimated body mass index is 26.87 kg/m as calculated from the following:   Height as of this encounter: 6' 3" (1.905 m).   Weight as of this encounter: 97.5 kg (215 lb).  Procedure:  Procedure(s) (LRB): RIGHT TOTAL KNEE ARTHROPLASTY (Right)   Consults: None  HPI: see H&P Laboratory Data: Admission on 04/23/2016  Component Date Value Ref Range Status  . WBC 04/24/2016 18.0* 4.0 - 10.5 K/uL Final  . RBC 04/24/2016 4.28  4.22 - 5.81 MIL/uL Final  . Hemoglobin 04/24/2016 13.0  13.0 - 17.0 g/dL Final  . HCT 04/24/2016 37.9* 39.0 - 52.0 % Final  . MCV 04/24/2016 88.6  78.0 - 100.0 fL Final  . MCH 04/24/2016 30.4  26.0 - 34.0 pg Final  . MCHC 04/24/2016 34.3  30.0 - 36.0 g/dL Final  . RDW 04/24/2016 13.5  11.5 - 15.5 % Final  . Platelets 04/24/2016 262  150 - 400 K/uL Final  . Sodium 04/24/2016 135  135 - 145 mmol/L Final  . Potassium 04/24/2016 4.3  3.5 - 5.1 mmol/L Final  . Chloride 04/24/2016 102  101 - 111 mmol/L Final  . CO2 04/24/2016 27  22 - 32 mmol/L Final  . Glucose, Bld 04/24/2016 148* 65 - 99 mg/dL Final  . BUN 04/24/2016 13  6 - 20 mg/dL Final  . Creatinine, Ser 04/24/2016 1.09  0.61 - 1.24 mg/dL Final  . Calcium 04/24/2016 8.2* 8.9 - 10.3 mg/dL Final  . GFR calc non Af Amer 04/24/2016 >60  >60 mL/min Final  . GFR calc Af Amer 04/24/2016 >60  >60 mL/min Final   Comment: (NOTE) The eGFR has been calculated using the CKD EPI equation. This calculation has not been validated in all clinical situations. eGFR's persistently <60 mL/min signify possible Chronic  Kidney Disease.   . Anion gap 04/24/2016 6  5 - 15 Final  . WBC 04/25/2016 8.7  4.0 - 10.5 K/uL Final  . RBC 04/25/2016 3.85* 4.22 - 5.81 MIL/uL Final  . Hemoglobin 04/25/2016 11.8* 13.0 - 17.0 g/dL Final  . HCT 04/25/2016 34.6* 39.0 - 52.0 % Final  . MCV 04/25/2016 89.9  78.0 - 100.0 fL Final  . MCH 04/25/2016 30.6  26.0 - 34.0 pg Final  . MCHC 04/25/2016 34.1  30.0 - 36.0 g/dL Final  . RDW 04/25/2016 13.9  11.5 - 15.5 % Final  . Platelets 04/25/2016 243  150 - 400 K/uL Final  . WBC 04/26/2016 9.2  4.0 - 10.5 K/uL Final  . RBC 04/26/2016 3.80* 4.22 - 5.81 MIL/uL Final  . Hemoglobin 04/26/2016 11.8* 13.0 - 17.0 g/dL Final  . HCT 04/26/2016 34.4* 39.0 - 52.0 % Final  . MCV 04/26/2016 90.5  78.0 - 100.0 fL Final  . MCH 04/26/2016 31.1  26.0 - 34.0 pg Final  . MCHC 04/26/2016 34.3  30.0 - 36.0 g/dL Final  . RDW 04/26/2016 14.1  11.5 - 15.5 % Final  . Platelets 04/26/2016 256  150 - 400 K/uL Final  Hospital Outpatient Visit on 04/13/2016  Component Date Value Ref Range Status  . aPTT 04/13/2016  29  24 - 36 seconds Final  . Sodium 04/13/2016 139  135 - 145 mmol/L Final  . Potassium 04/13/2016 4.1  3.5 - 5.1 mmol/L Final  . Chloride 04/13/2016 107  101 - 111 mmol/L Final  . CO2 04/13/2016 27  22 - 32 mmol/L Final  . Glucose, Bld 04/13/2016 95  65 - 99 mg/dL Final  . BUN 04/13/2016 16  6 - 20 mg/dL Final  . Creatinine, Ser 04/13/2016 1.18  0.61 - 1.24 mg/dL Final  . Calcium 04/13/2016 9.0  8.9 - 10.3 mg/dL Final  . GFR calc non Af Amer 04/13/2016 >60  >60 mL/min Final  . GFR calc Af Amer 04/13/2016 >60  >60 mL/min Final   Comment: (NOTE) The eGFR has been calculated using the CKD EPI equation. This calculation has not been validated in all clinical situations. eGFR's persistently <60 mL/min signify possible Chronic Kidney Disease.   . Anion gap 04/13/2016 5  5 - 15 Final  . WBC 04/13/2016 5.8  4.0 - 10.5 K/uL Final  . RBC 04/13/2016 4.97  4.22 - 5.81 MIL/uL Final  . Hemoglobin  04/13/2016 14.9  13.0 - 17.0 g/dL Final  . HCT 04/13/2016 44.1  39.0 - 52.0 % Final  . MCV 04/13/2016 88.7  78.0 - 100.0 fL Final  . MCH 04/13/2016 30.0  26.0 - 34.0 pg Final  . MCHC 04/13/2016 33.8  30.0 - 36.0 g/dL Final  . RDW 04/13/2016 13.8  11.5 - 15.5 % Final  . Platelets 04/13/2016 261  150 - 400 K/uL Final  . Prothrombin Time 04/13/2016 12.8  11.4 - 15.2 seconds Final  . INR 04/13/2016 0.97   Final  . ABO/RH(D) 04/23/2016 O POS   Final  . Antibody Screen 04/23/2016 NEG   Final  . Sample Expiration 04/23/2016 04/26/2016   Final  . Extend sample reason 04/23/2016 NO TRANSFUSIONS OR PREGNANCY IN THE PAST 3 MONTHS   Final  . Color, Urine 04/13/2016 YELLOW  YELLOW Final  . APPearance 04/13/2016 CLEAR  CLEAR Final  . Specific Gravity, Urine 04/13/2016 1.026  1.005 - 1.030 Final  . pH 04/13/2016 5.5  5.0 - 8.0 Final  . Glucose, UA 04/13/2016 NEGATIVE  NEGATIVE mg/dL Final  . Hgb urine dipstick 04/13/2016 TRACE* NEGATIVE Final  . Bilirubin Urine 04/13/2016 NEGATIVE  NEGATIVE Final  . Ketones, ur 04/13/2016 NEGATIVE  NEGATIVE mg/dL Final  . Protein, ur 04/13/2016 NEGATIVE  NEGATIVE mg/dL Final  . Nitrite 04/13/2016 NEGATIVE  NEGATIVE Final  . Leukocytes, UA 04/13/2016 NEGATIVE  NEGATIVE Final  . MRSA, PCR 04/13/2016 NEGATIVE  NEGATIVE Final  . Staphylococcus aureus 04/13/2016 POSITIVE* NEGATIVE Final   Comment:        The Xpert SA Assay (FDA approved for NASAL specimens in patients over 6 years of age), is one component of a comprehensive surveillance program.  Test performance has been validated by [facility] for patients greater than or equal to 53 year old. It is not intended to diagnose infection nor to guide or monitor treatment.   . ABO/RH(D) 04/13/2016 O POS   Final  . Squamous Epithelial / LPF 04/13/2016 0-5* NONE SEEN Final  . WBC, UA 04/13/2016 0-5  0 - 5 WBC/hpf Final  . RBC / HPF 04/13/2016 0-5  0 - 5 RBC/hpf Final  . Bacteria, UA 04/13/2016 FEW* NONE SEEN  Final  . Edwina Barth 04/13/2016 MUCOUS PRESENT   Final  Hospital Outpatient Visit on 04/01/2016  Component Date Value Ref Range Status  . LV  PW d 04/01/2016 11.7* 0.6 - 1.1 mm Final  . FS 04/01/2016 25* 28 - 44 % Final  . LA vol 04/01/2016 39.4  mL Final  . LA ID, A-P, ES 04/01/2016 21  mm Final  . IVS/LV PW RATIO, ED 04/01/2016 1.05   Final  . LV e' LATERAL 04/01/2016 9.25  cm/s Final  . LV E/e' medial 04/01/2016 7.45   Final  . LV E/e'average 04/01/2016 7.45   Final  . LA diam index 04/01/2016 .9  cm/m2 Final  . LA vol A4C 04/01/2016 24.5  ml Final  . P 1/2 time 04/01/2016 603  ms Final  . E decel time 04/01/2016 232  msec Final  . LVOT diameter 04/01/2016 24  mm Final  . LVOT area 04/01/2016 4.52  cm2 Final  . E/e' ratio 04/01/2016 7.45   Final  . MV pk E vel 04/01/2016 68.9  m/s Final  . MV pk A vel 04/01/2016 80.5  m/s Final  . LA vol index 04/01/2016 16.9  mL/m2 Final  . MV Dec 04/01/2016 232   Final  . LA diam end sys 04/01/2016 21.00  mm Final  . TDI e' medial 04/01/2016 6.09   Final  . TDI e' lateral 04/01/2016 9.25   Final     X-Rays:Dg Knee Right Port  Result Date: 04/23/2016 CLINICAL DATA:  Status post right knee replacement. EXAM: PORTABLE RIGHT KNEE - 1-2 VIEW COMPARISON:  None. FINDINGS: Sequelae of recent total knee arthroplasty are identified. The prosthetic components appear normally located. No acute fracture is identified. Postoperative gas is present in the knee joint and surrounding soft tissues. Anterior skin staples are in place. IMPRESSION: Right total knee arthroplasty without evidence of acute complication. Electronically Signed   By: Logan Bores M.D.   On: 04/23/2016 16:25    EKG: Orders placed or performed in visit on 05/28/15  . EKG 12-Lead     Hospital Course: Nasri Boakye is a 60 y.o. who was admitted to [facility]. They were brought to the operating room on 04/23/2016 and underwent Procedure(s): RIGHT TOTAL KNEE  ARTHROPLASTY.  Patient tolerated the procedure well and was later transferred to the recovery room and then to the orthopaedic floor for postoperative care.  They were given PO and IV analgesics for pain control following their surgery.  They were given 24 hours of postoperative antibiotics of  Anti-infectives    Start     Dose/Rate Route Frequency Ordered Stop   04/23/16 2000  ceFAZolin (ANCEF) IVPB 2g/100 mL premix     2 g 200 mL/hr over 30 Minutes Intravenous Every 6 hours 04/23/16 1809 04/24/16 0144   04/23/16 1347  polymyxin B 500,000 Units, bacitracin 50,000 Units in sodium chloride irrigation 0.9 % 500 mL irrigation  Status:  Discontinued       As needed 04/23/16 1354 04/23/16 1551   04/23/16 1115  clindamycin (CLEOCIN) IVPB 900 mg     900 mg 100 mL/hr over 30 Minutes Intravenous On call to O.R. 04/23/16 1103 04/23/16 1345   04/23/16 1115  ceFAZolin (ANCEF) IVPB 2g/100 mL premix     2 g 200 mL/hr over 30 Minutes Intravenous On call to O.R. 04/23/16 1103 04/23/16 1355     and started on DVT prophylaxis in the form of Xarelto, TED hose and SCDs.   PT and OT were ordered for total joint protocol.  Discharge planning consulted to help with postop disposition and equipment needs.  Patient had a fair night on  the evening of surgery.  They started to get up OOB with therapy on day one. On POD #2 he was noted to have increased swelling, possibly due to a bleed associated with xarelto. His xarelto was D/C'd (only received dose on POD1), CPM held, rewrapped in ACEs for compression plus placed back into a knee immobilizer. Aggressive ice and elevation RLE toes above the nose was started. Continued to work with therapy into day two, limited due to increased pain and swelling- no flexion. By day three, the patient had progressed with therapy and meeting their goals.  Incision was healing well.  Patient was seen in rounds and was ready to go home.   Diet: Regular diet Activity:WBAT Follow-up:in 10-14  days Disposition - Home Discharged Condition: good   Discharge Instructions    Call MD / Call 911    Complete by:  As directed    If you experience chest pain or shortness of breath, CALL 911 and be transported to the hospital emergency room.  If you develope a fever above 101 F, pus (white drainage) or increased drainage or redness at the wound, or calf pain, call your surgeon's office.   Constipation Prevention    Complete by:  As directed    Drink plenty of fluids.  Prune juice may be helpful.  You may use a stool softener, such as Colace (over the counter) 100 mg twice a day.  Use MiraLax (over the counter) for constipation as needed.   Diet - low sodium heart healthy    Complete by:  As directed    Increase activity slowly as tolerated    Complete by:  As directed        Medication List    TAKE these medications   aspirin 81 MG EC tablet Take 1 tablet (81 mg total) by mouth 2 (two) times daily.   docusate sodium 100 MG capsule Commonly known as:  COLACE Take 1 capsule (100 mg total) by mouth 2 (two) times daily as needed for mild constipation.   methocarbamol 500 MG tablet Commonly known as:  ROBAXIN Take 1 tablet (500 mg total) by mouth every 6 (six) hours as needed for muscle spasms.   oxyCODONE 5 MG immediate release tablet Commonly known as:  Oxy IR/ROXICODONE Take 1-2 tablets (5-10 mg total) by mouth every 4 (four) hours as needed for severe pain.   polyethylene glycol packet Commonly known as:  MIRALAX / GLYCOLAX Take 17 g by mouth daily.            Durable Medical Equipment        Start     Ordered   04/24/16 1135  For home use only DME 3 n 1  Once     04/24/16 1134     Follow-up Information    BEANE,JEFFREY C, MD Follow up in 2 week(s).   Specialty:  Orthopedic Surgery Contact information: [address] [place] [address] [phone]        Johnn Hai, MD Follow up in 2 week(s).   Specialty:  Orthopedic  Surgery Contact information: [address] [place] [address] [phone]        [facility] Follow up.   Specialty:  [place] Why:  Home Health Physical Therapy Contact information: [address] [place] [address] [phone]           Signed: Lacie Draft, PA-C Orthopaedic Surgery 04/26/2016, 8:48 AM

## 2016-04-26 NOTE — Progress Notes (Signed)
Subjective: 3 Days Post-Op Procedure(s) (LRB): RIGHT TOTAL KNEE ARTHROPLASTY (Right) Patient reports pain as mild. Reports he is doing better today, has kept the leg elevated, minimal PT. He did not take pain meds overnight and pain was more significant when getting out of bed this morning to get to the bathroom. No other c/o. Voiding without difficulty. Feels ready to go home today.  Objective: Vital signs in last 24 hours: Temp:  [98.6 F (37 C)-99.1 F (37.3 C)] 98.6 F (37 C) (12/04 0500) Pulse Rate:  [104-117] 117 (12/04 0500) Resp:  [18] 18 (12/04 0500) BP: (118-135)/(63-75) 135/63 (12/04 0500) SpO2:  [93 %-96 %] 96 % (12/04 0500)  Intake/Output from previous day: 12/03 0701 - 12/04 0700 In: 1480 [P.O.:1480] Out: -  Intake/Output this shift: No intake/output data recorded.   Recent Labs  04/24/16 0448 04/25/16 0430 04/26/16 0411  HGB 13.0 11.8* 11.8*    Recent Labs  04/25/16 0430 04/26/16 0411  WBC 8.7 9.2  RBC 3.85* 3.80*  HCT 34.6* 34.4*  PLT 243 256    Recent Labs  04/24/16 0448  NA 135  K 4.3  CL 102  CO2 27  BUN 13  CREATININE 1.09  GLUCOSE 148*  CALCIUM 8.2*   No results for input(s): LABPT, INR in the last 72 hours.  Neurologically intact ABD soft Neurovascular intact Sensation intact distally Intact pulses distally Dorsiflexion/Plantar flexion intact Incision: dressing C/D/I and scant drainage No cellulitis present Compartment soft no calf pain, negative Homan's  Swelling RLE improving No ecchymosis  Assessment/Plan: 3 Days Post-Op Procedure(s) (LRB): RIGHT TOTAL KNEE ARTHROPLASTY (Right) Advance diet Up with therapy D/C IV fluids  Resume PT- gentle flexion Continue to hold CPM Keep on ASA 81mg  BID for DVT ppx Plan D/C home later today (Dr. Shelle Iron to see before 1pm) with home PT Discussed D/C instructions Continue ice and elevation here and at home, toes above the nose Discussed with Dr. Elissa Lovett, Dayna Barker. 04/26/2016, 8:13 AM

## 2016-04-26 NOTE — Progress Notes (Signed)
Discharge teaching completed with pt and spouse. Reviewed updated med list, prescriptions, f/u appts, and s/s of infection to report to MD. Pt and spouse attentive and verbalized understanding of all discharge education.

## 2016-04-26 NOTE — Progress Notes (Signed)
CSW consulted to assist with d/c planning. PN reviewed. PT has recommended HHPT at d/c. RNCM is assisting with d/c planning.  Matilda Fleig LCSW [phone] 

## 2016-04-26 NOTE — Progress Notes (Signed)
Physical Therapy Treatment Patient Details Name: Vincent PoliteClifford Whitney-Taylor MRN: [id number] DOB: 04/05/1956 Today's Date: 04/26/2016    History of Present Illness s/p R TKA    PT Comments    Patient was seen in bathroom upon arrival. Patient rated pain in R knee as a 4/10 before treatment. Sit to stand x min guard with VC's for UE/LE placement. Educated patient/wife on how to donn/doff knee immobilizer and when to wear/discontinue use. Gait x 120 ft x min guard with VC's to maintain upright posture and for LE placement within the walker. Performed 1 step with VC's on sequencing when ascending/descending and safe walker placement. Returned to bed following ambulation and performed AROM/AAROM exercises. Performed gentle AAROM knee flexion per PA order. Applied ice to R knee to decrease inflammation/pain. Patient plans to D/C home this afternoon.   Follow Up Recommendations  Home health PT     Equipment Recommendations  None recommended by PT    Recommendations for Other Services       Precautions / Restrictions Precautions Precautions: Fall;Knee Precaution Comments: instructed pt and wife on how to don/doff knee immbolizer and when to wear/discontinue. Required Braces or Orthoses: Knee Immobilizer - Right Restrictions Weight Bearing Restrictions: No RLE Weight Bearing: Weight bearing as tolerated    Mobility  Bed Mobility Overall bed mobility level: Needs assistance Bed Mobility: Sit to supine Sit to supine: Min guard/minA General bed mobility comment: minA required to assist RLE in bed.                   Transfers Overall transfer level: Needs assistance Equipment used: Rolling walker (2 wheeled) Transfers: Sit to/from Stand Sit to Stand: From elevated surface;Min guard         General transfer comment: cues for UE/ LE management.  Ambulation/Gait Ambulation/Gait assistance: Min guard;Supervision Ambulation Distance (Feet): 120 Feet Assistive device: Rolling  walker (2 wheeled) Gait Pattern/deviations: Step-to pattern;Step-through pattern;Decreased stance time - right;Decreased stride length;Antalgic;Trunk flexed Gait velocity: decreased Gait velocity interpretation: Below normal speed for age/gender General Gait Details: VCs to maintain upright posture and for LE positioning within RW.   Stairs Stairs: Yes Stairs assistance: Min assist Stair Management: No rails;With walker;Forwards Number of Stairs: 1 General stair comments: VC's for sequencing with walker ascending/descending.   Wheelchair Mobility    Modified Rankin (Stroke Patients Only)       Balance                                    Cognition Arousal/Alertness: Awake/alert Behavior During Therapy: WFL for tasks assessed/performed Overall Cognitive Status: Within Functional Limits for tasks assessed                      Exercises Total Joint Exercises Ankle Circles/Pumps: AROM;Both;10 reps Quad Sets: 10 reps;Both;AROM Towel Squeeze: AROM;10 reps;Both Short Arc Quad: AAROM;Right;5 reps Heel Slides: Right;5 reps;AAROM (gentle) Straight Leg Raises: AAROM;Right;5 reps    General Comments        Pertinent Vitals/Pain Pain Assessment: 0-10 Pain Score: 4  Pain Location: R knee Pain Descriptors / Indicators: Sore;Grimacing Pain Intervention(s): Limited activity within patient's tolerance;Monitored during session;Patient requesting pain meds-RN notified;Repositioned;Ice applied    Home Living                      Prior Function            PT Goals (current goals  can now be found in the care plan section) Progress towards PT goals: Progressing toward goals    Frequency    7X/week      PT Plan Current plan remains appropriate    Co-evaluation             End of Session Equipment Utilized During Treatment: Gait belt;Right knee immobilizer Activity Tolerance: Patient tolerated treatment well Patient left: in bed;with  call bell/phone within reach;with family/visitor present     Time:  - 10:30 - 11:01    Charges:    1 gt   1 te                   G CodesMarcelino Scot, SPTA [facility] [phone]  Reviewed and agree with above  Felecia Shelling  PTA [facility]  Acute  Rehab Pager      [phone]

## 2016-04-26 NOTE — Op Note (Signed)
NAMKathee Polite:  WHITNEY-TAYLOR, Jatavius     ACCOUNT NO.:  [id number]  MEDICAL RECORD NO.:  [id number]  LOCATION:                                 FACILITY:  PHYSICIAN:  Jene EveryJeffrey Josip Merolla, M.D.    DATE OF BIRTH:  08-16-1955  DATE OF PROCEDURE:  04/23/2016 DATE OF DISCHARGE:                              OPERATIVE REPORT   PREOPERATIVE DIAGNOSIS:  End-stage osteoarthrosis of the right knee.  POSTOPERATIVE DIAGNOSIS:  End-stage osteoarthrosis of the right knee.  PROCEDURE PERFORMED:  Right total knee arthroplasty.  ANESTHESIA:  General.  ASSISTANT:  Lanna PocheJacqueline Bissell, PA.  COMPLEMENTS:  DePuy rotating platform, 6 femur, 8 tibia, 7 insert, 38 patella.  ANESTHESIA:  Spinal.  HISTORY:  A 60 year old, bone-on-bone arthrosis, medial compartment, varus deformity, indicated for replacement of degenerated joint.  Risks and benefits discussed including bleeding, infection, damage to vascular structure, suboptimal range of motion, DVT, PE, anesthetic complications, etc.  He had failed conservative treatment, significant negative affect on his activities of daily living.  TECHNIQUE:  With the patient in supine position, after induction of adequate general anesthesia, 2 g Kefzol, the right lower extremity was prepped, draped, and exsanguinated in usual sterile fashion.  Thigh tourniquet was inflated to 275 mmHg.  Midline incision was then made over the knee.  Full-thickness flaps developed.  Median parapatellar arthrotomy was performed.  I elevated soft tissues medially, preserving the MCL.  Knee flexed, tricompartmental osteoarthrosis was noted, particularly medial compartment.  A Leksell was utilized to remove osteophytes with a rongeur.  Remnants of medial and lateral meniscus and the ACL were removed.  Step drill was utilized.  Then, the femoral canal was irrigated.  5 degree right was utilized with 10 off the distal femur due to flexion contracture.  Femur was at a high lateral ridge  slightly rotated internally.  It was pinned and a distal femoral cut was performed with an oscillating saw.  We then subluxed the tibia.  We used an external alignment guide, 4 off the defect, which was medial. Bisecting tibiotalar joint, 30 degree slope.  Oscillating saw performed this cut.  This was parallel to the shaft.  Following this, we checked our extension gap which was full 6 mm insert and stable.  Then flexed the knee, I turned our attention towards completing the femur.  This was fairly wide medial laterally.  Then, we sized off the anterior cortex.  We used a 3 degrees right, it was scheduled just at a 6 with that attended, we used our wing guide to check and it was just anterior to the cortex.  Checked posteriorly, there was appropriate posterior condyle resected.  I then pinned this and put our cutting block on the 6 and we checked it again with 3rd alignment guide off the anterior cortex posteriorly with our block, appeared to be appropriate cut.  We felt 7 was too large.  We chose a 6.  We performed our cut anteriorly posteriorly and our chamfer cuts did not notch the femur.  We then performed our box cut bisecting the femoral canal.  There was more medial condyle than lateral.  Then attention turned towards the tibia, it was subluxed.  A trialed to a 7 and then maximally at an 8  just to the medial aspect of tibial tubercle, pinned, drilled centrally at our punch guide, and then placed our trial femur drilling the lug holes.  We placed a 6 insert and reduced it.  It appeared to be satisfactory.  Good stability in varus and valgus stressing 0-30 degrees.  We everted the patella.  Measured it to a 27, planed it to a 17 with an external planing guide.  We then measured it to a 38, utilizing our trial paddle with flexion parallel to the joint line, we drilled our peg holes medializing them.  We placed a trial patella, reduced it, had excellent patellofemoral tracking.  We  removed all instrumentation.  We checked posteriorly, and meticulously removed large posterior condylar osteophytes.  With an osteotome protecting the posterior elements with a curved Crego at all times.  Used pulsatile lavage.  Copiously irrigated the wound.  Flexed the knee, dried all surfaces thoroughly, mixed cement on back table, subluxed the tibia.  Mixed cement on the back table in appropriate fashion.  Injected in the tibial canal, digitally pressurizing it, impacted our 8 tibial tray, redundant cement removed.  We cemented the femur, redundant cement removed.  Placed a 6 insert, reduced it, and held axial load throughout the curing of the cement.  We also then cemented the 38 patella and held it with a patellar clamp.  We placed lidocaine with epinephrine into the joint and injected the periarticular tissues of Marcaine.  Bone wax placed on the cancellous surfaces.  After appropriate curing of the cement, I released the tourniquet cauterized any vessels.  Minimal bleeding.  We flexed the knee, and removed redundant cement.  I then felt that 7 was a better insert after redundant cement removed and residual osteophytes removed off the posterior femoral condyles.  The patient had really long the tibia with varus deformity with some rotation of the femur and lateral edge.  Next, we copiously irrigated the wound.  Subluxed the tibia, placed a 7 permanent insert, reduced it, we had full extension, full flexion, good stability to varus and valgus stressing 0-30 degrees.  Negative anterior drawer.  We then copiously irrigated the wound again.  We repaired the patellar arthrotomy with 1 Vicryl interrupted figure-of-eight sutures. Then a running Stratafix with flexion to gravity at 90 degrees and excellent patellofemoral tracking following the closure.  Negative anterior drawer.  Subcu with 2-0, and skin with staples.  Wound was dressed sterilely.  The patient was then placed on the  hospital bed, transported to the recovery in satisfactory condition.  The patient tolerated the procedure well.  No complications. Marlou Porch, PA.  Minimal blood loss.  Tourniquet time was 74 minutes.     Jene Every, M.D.   ______________________________ Jene Every, M.D.    Cordelia Pen  D:  04/23/2016  T:  04/24/2016  Job:  [id number]

## 2016-04-27 DIAGNOSIS — Z96651 Presence of right artificial knee joint: Secondary | ICD-10-CM | POA: Diagnosis not present

## 2016-04-27 DIAGNOSIS — Z7982 Long term (current) use of aspirin: Secondary | ICD-10-CM | POA: Diagnosis not present

## 2016-04-27 DIAGNOSIS — F329 Major depressive disorder, single episode, unspecified: Secondary | ICD-10-CM | POA: Diagnosis not present

## 2016-04-27 DIAGNOSIS — Z79891 Long term (current) use of opiate analgesic: Secondary | ICD-10-CM | POA: Diagnosis not present

## 2016-04-27 DIAGNOSIS — J45909 Unspecified asthma, uncomplicated: Secondary | ICD-10-CM | POA: Diagnosis not present

## 2016-04-27 DIAGNOSIS — Z8781 Personal history of (healed) traumatic fracture: Secondary | ICD-10-CM | POA: Diagnosis not present

## 2016-04-27 DIAGNOSIS — Z471 Aftercare following joint replacement surgery: Secondary | ICD-10-CM | POA: Diagnosis not present

## 2016-04-28 DIAGNOSIS — Z96651 Presence of right artificial knee joint: Secondary | ICD-10-CM | POA: Diagnosis not present

## 2016-04-28 DIAGNOSIS — Z471 Aftercare following joint replacement surgery: Secondary | ICD-10-CM | POA: Diagnosis not present

## 2016-04-28 DIAGNOSIS — F329 Major depressive disorder, single episode, unspecified: Secondary | ICD-10-CM | POA: Diagnosis not present

## 2016-04-28 DIAGNOSIS — J45909 Unspecified asthma, uncomplicated: Secondary | ICD-10-CM | POA: Diagnosis not present

## 2016-04-28 DIAGNOSIS — Z8781 Personal history of (healed) traumatic fracture: Secondary | ICD-10-CM | POA: Diagnosis not present

## 2016-04-28 DIAGNOSIS — Z79891 Long term (current) use of opiate analgesic: Secondary | ICD-10-CM | POA: Diagnosis not present

## 2016-04-28 DIAGNOSIS — Z7982 Long term (current) use of aspirin: Secondary | ICD-10-CM | POA: Diagnosis not present

## 2016-05-01 DIAGNOSIS — Z96651 Presence of right artificial knee joint: Secondary | ICD-10-CM | POA: Diagnosis not present

## 2016-05-01 DIAGNOSIS — Z7982 Long term (current) use of aspirin: Secondary | ICD-10-CM | POA: Diagnosis not present

## 2016-05-01 DIAGNOSIS — Z8781 Personal history of (healed) traumatic fracture: Secondary | ICD-10-CM | POA: Diagnosis not present

## 2016-05-01 DIAGNOSIS — J45909 Unspecified asthma, uncomplicated: Secondary | ICD-10-CM | POA: Diagnosis not present

## 2016-05-01 DIAGNOSIS — Z471 Aftercare following joint replacement surgery: Secondary | ICD-10-CM | POA: Diagnosis not present

## 2016-05-01 DIAGNOSIS — Z79891 Long term (current) use of opiate analgesic: Secondary | ICD-10-CM | POA: Diagnosis not present

## 2016-05-01 DIAGNOSIS — F329 Major depressive disorder, single episode, unspecified: Secondary | ICD-10-CM | POA: Diagnosis not present

## 2016-05-03 DIAGNOSIS — Z8781 Personal history of (healed) traumatic fracture: Secondary | ICD-10-CM | POA: Diagnosis not present

## 2016-05-03 DIAGNOSIS — Z96651 Presence of right artificial knee joint: Secondary | ICD-10-CM | POA: Diagnosis not present

## 2016-05-03 DIAGNOSIS — J45909 Unspecified asthma, uncomplicated: Secondary | ICD-10-CM | POA: Diagnosis not present

## 2016-05-03 DIAGNOSIS — Z79891 Long term (current) use of opiate analgesic: Secondary | ICD-10-CM | POA: Diagnosis not present

## 2016-05-03 DIAGNOSIS — F329 Major depressive disorder, single episode, unspecified: Secondary | ICD-10-CM | POA: Diagnosis not present

## 2016-05-03 DIAGNOSIS — Z7982 Long term (current) use of aspirin: Secondary | ICD-10-CM | POA: Diagnosis not present

## 2016-05-03 DIAGNOSIS — Z471 Aftercare following joint replacement surgery: Secondary | ICD-10-CM | POA: Diagnosis not present

## 2016-05-07 DIAGNOSIS — J45909 Unspecified asthma, uncomplicated: Secondary | ICD-10-CM | POA: Diagnosis not present

## 2016-05-07 DIAGNOSIS — Z7982 Long term (current) use of aspirin: Secondary | ICD-10-CM | POA: Diagnosis not present

## 2016-05-07 DIAGNOSIS — Z471 Aftercare following joint replacement surgery: Secondary | ICD-10-CM | POA: Diagnosis not present

## 2016-05-07 DIAGNOSIS — Z8781 Personal history of (healed) traumatic fracture: Secondary | ICD-10-CM | POA: Diagnosis not present

## 2016-05-07 DIAGNOSIS — Z96651 Presence of right artificial knee joint: Secondary | ICD-10-CM | POA: Diagnosis not present

## 2016-05-07 DIAGNOSIS — Z79891 Long term (current) use of opiate analgesic: Secondary | ICD-10-CM | POA: Diagnosis not present

## 2016-05-07 DIAGNOSIS — F329 Major depressive disorder, single episode, unspecified: Secondary | ICD-10-CM | POA: Diagnosis not present

## 2016-05-18 DIAGNOSIS — M25561 Pain in right knee: Secondary | ICD-10-CM | POA: Diagnosis not present

## 2016-05-20 DIAGNOSIS — M25561 Pain in right knee: Secondary | ICD-10-CM | POA: Diagnosis not present

## 2016-05-26 DIAGNOSIS — M25561 Pain in right knee: Secondary | ICD-10-CM | POA: Diagnosis not present

## 2016-05-28 DIAGNOSIS — M25561 Pain in right knee: Secondary | ICD-10-CM | POA: Diagnosis not present

## 2016-06-04 DIAGNOSIS — Z96651 Presence of right artificial knee joint: Secondary | ICD-10-CM | POA: Diagnosis not present

## 2016-06-07 DIAGNOSIS — M25561 Pain in right knee: Secondary | ICD-10-CM | POA: Diagnosis not present

## 2016-06-17 DIAGNOSIS — M25561 Pain in right knee: Secondary | ICD-10-CM | POA: Diagnosis not present

## 2016-06-22 DIAGNOSIS — M25561 Pain in right knee: Secondary | ICD-10-CM | POA: Diagnosis not present

## 2016-06-29 DIAGNOSIS — M25561 Pain in right knee: Secondary | ICD-10-CM | POA: Diagnosis not present

## 2016-07-01 DIAGNOSIS — M25561 Pain in right knee: Secondary | ICD-10-CM | POA: Diagnosis not present

## 2016-12-30 ENCOUNTER — Telehealth: Payer: Self-pay

## 2016-12-30 ENCOUNTER — Encounter: Payer: Self-pay | Admitting: Family Medicine

## 2016-12-30 ENCOUNTER — Ambulatory Visit (INDEPENDENT_AMBULATORY_CARE_PROVIDER_SITE_OTHER): Payer: BLUE CROSS/BLUE SHIELD | Admitting: Family Medicine

## 2016-12-30 VITALS — BP 121/81 | HR 92 | Temp 99.2°F | Resp 16 | Ht 75.0 in | Wt 214.0 lb

## 2016-12-30 DIAGNOSIS — J029 Acute pharyngitis, unspecified: Secondary | ICD-10-CM | POA: Diagnosis not present

## 2016-12-30 DIAGNOSIS — J36 Peritonsillar abscess: Secondary | ICD-10-CM

## 2016-12-30 LAB — POCT CBC
Granulocyte percent: 80.5 %G — AB (ref 37–80)
HCT, POC: 45.6 % (ref 43.5–53.7)
Hemoglobin: 15 g/dL (ref 14.1–18.1)
Lymph, poc: 2.2 (ref 0.6–3.4)
MCH: 29.8 pg (ref 27–31.2)
MCHC: 33 g/dL (ref 31.8–35.4)
MCV: 90.4 fL (ref 80–97)
MID (CBC): 0.4 (ref 0–0.9)
MPV: 8.1 fL (ref 0–99.8)
PLATELET COUNT, POC: 263 10*3/uL (ref 142–424)
POC Granulocyte: 10.9 — AB (ref 2–6.9)
POC LYMPH PERCENT: 16.2 %L (ref 10–50)
POC MID %: 3.3 %M (ref 0–12)
RBC: 5.05 M/uL (ref 4.69–6.13)
RDW, POC: 14.1 %
WBC: 13.5 10*3/uL — AB (ref 4.6–10.2)

## 2016-12-30 LAB — POCT RAPID STREP A (OFFICE): Rapid Strep A Screen: NEGATIVE

## 2016-12-30 MED ORDER — AMOXICILLIN-POT CLAVULANATE 875-125 MG PO TABS: 1.0000 | ORAL_TABLET | Freq: Two times a day (BID) | ORAL | 0 refills | Status: DC

## 2016-12-30 MED ORDER — CEFTRIAXONE SODIUM 1 G IJ SOLR
1.0000 g | Freq: Once | INTRAMUSCULAR | Status: AC
  Administered 2016-12-30: 1 g via INTRAMUSCULAR

## 2016-12-30 NOTE — Patient Instructions (Addendum)
Drink plenty of fluids  Take Augmentin 875 mg twice daily  Return tomorrow for a recheck unless you're doing much better.    IF you received an x-ray today, you will receive an invoice from [facility]. Please contact [facility] at [phone] with questions or concerns regarding your invoice.   IF you received labwork today, you will receive an invoice from [facility]. Please contact [facility] at [phone] with questions or concerns regarding your invoice.   Our billing staff will not be able to assist you with questions regarding bills from these companies.  You will be contacted with the lab results as soon as they are available. The fastest way to get your results is to activate your My Chart account. Instructions are located on the last page of this paperwork. If you have not heard from [place] regarding the results in 2 weeks, please contact this office.

## 2016-12-30 NOTE — Progress Notes (Signed)
Patient ID: Vincent Ray, male    DOB: 1955-11-02  Age: 61 y.o. MRN: [id number]  Chief Complaint  Patient presents with  . New Patient (Initial Visit)    Trouble swallowing, Tongue issue neck pain    Subjective:   Generally healthy man who comes in today with a history of having developed a sore throat yesterday. It feels swollen and painful on the right side of his throat. He has trouble swallowing. He has not been running a fever. No one else has a sore throat. He does not smoke. He works for [employer] as a Occupational psychologist.  Current allergies, medications, problem list, past/family and social histories reviewed.  Objective:  BP 121/81   Pulse 92   Temp 99.2 F (37.3 C) (Oral)   Resp 16   Ht 6\' 3"  (1.905 m)   Wt 214 lb (97.1 kg)   SpO2 97%   BMI 26.75 kg/m   Doesn't look like he feels very well. Otherwise is healthy appearing. TMs normal. Throat erythematous and swollen on the right side of his pharynx. His tonsils are not very large, but the tissue is very swollen. On palpation in the back of his throat it is fluctuant a little in that area. His neck is tender in the right submandibular angle with [place] tender node there. Chest clear. Heart regular without murmur.  Assessment & Plan:   Assessment: 1. Acute pharyngitis, unspecified etiology   2. Peritonsillar abscess       Plan: This appears to be a peritonsillar abscess coming on, however there is no respiratory compromise. It is not near to approaching the uvula, with a wide open air space. Hopefully by treating him early this can resolve quickly.  Orders Placed This Encounter  Procedures  . Culture, Group A Strep    Order Specific Question:   Source    Answer:   throat  . POCT CBC  . POCT rapid strep A    Meds ordered this encounter  Medications  . cefTRIAXone (ROCEPHIN) injection 1 g         Patient Instructions   Drink plenty of fluids  Take Augmentin 875 mg twice daily  Return  tomorrow for a recheck unless you're doing much better.    IF you received an x-ray today, you will receive an invoice from [facility]. Please contact [facility] at [phone] with questions or concerns regarding your invoice.   IF you received labwork today, you will receive an invoice from [facility]. Please contact [facility] at [phone] with questions or concerns regarding your invoice.   Our billing staff will not be able to assist you with questions regarding bills from these companies.  You will be contacted with the lab results as soon as they are available. The fastest way to get your results is to activate your My Chart account. Instructions are located on the last page of this paperwork. If you have not heard from [place] regarding the results in 2 weeks, please contact this office.        Return in about 1 day (around 12/31/2016).   Lehman Whiteley, MD 12/30/2016

## 2016-12-30 NOTE — Addendum Note (Signed)
Addended by: HOPPER, DAVID H on: 12/30/2016 01:59 PM   Modules accepted: Orders

## 2016-12-30 NOTE — Telephone Encounter (Signed)
Lm on vm letting pt know that rx has been sent over to pharmacy and dr hopper apologizes for the delay.  If pt has questions or concerns to call office.  Per ROI ok to leave detailed voicemail. dg

## 2016-12-31 ENCOUNTER — Ambulatory Visit (INDEPENDENT_AMBULATORY_CARE_PROVIDER_SITE_OTHER): Payer: BLUE CROSS/BLUE SHIELD | Admitting: Physician Assistant

## 2016-12-31 VITALS — BP 135/84 | HR 93 | Temp 99.2°F | Resp 16 | Ht 75.0 in | Wt 214.0 lb

## 2016-12-31 DIAGNOSIS — D72829 Elevated white blood cell count, unspecified: Secondary | ICD-10-CM | POA: Diagnosis not present

## 2016-12-31 DIAGNOSIS — J36 Peritonsillar abscess: Secondary | ICD-10-CM

## 2016-12-31 DIAGNOSIS — J039 Acute tonsillitis, unspecified: Secondary | ICD-10-CM | POA: Diagnosis not present

## 2016-12-31 DIAGNOSIS — R070 Pain in throat: Secondary | ICD-10-CM

## 2016-12-31 LAB — POCT CBC
GRANULOCYTE PERCENT: 74.6 % (ref 37–80)
HEMATOCRIT: 45.2 % (ref 43.5–53.7)
HEMOGLOBIN: 14.7 g/dL (ref 14.1–18.1)
LYMPH, POC: 2.2 (ref 0.6–3.4)
MCH: 29.3 pg (ref 27–31.2)
MCHC: 32.6 g/dL (ref 31.8–35.4)
MCV: 90.1 fL (ref 80–97)
MID (cbc): 0.8 (ref 0–0.9)
MPV: 7.9 fL (ref 0–99.8)
POC GRANULOCYTE: 8.9 — AB (ref 2–6.9)
POC LYMPH PERCENT: 18.3 %L (ref 10–50)
POC MID %: 7.1 % (ref 0–12)
Platelet Count, POC: 264 10*3/uL (ref 142–424)
RBC: 5.01 M/uL (ref 4.69–6.13)
RDW, POC: 14 %
WBC: 11.9 10*3/uL — AB (ref 4.6–10.2)

## 2016-12-31 MED ORDER — CEFTRIAXONE SODIUM 1 G IJ SOLR
1.0000 g | Freq: Once | INTRAMUSCULAR | Status: AC
  Administered 2016-12-31: 1 g via INTRAMUSCULAR

## 2016-12-31 NOTE — Progress Notes (Signed)
PRIMARY CARE AT [facility] [address] [place] [address] [phone]  Date:  12/31/2016   Name:  Vincent Ray   DOB:  06/21/1955   MRN:  [id number]  PCP:  Vincent Levins, MD    History of Present Illness:  Vincent Ray is a 61 y.o. male patient who presents to PCP with  Chief Complaint  Patient presents with  . Follow-up    acute pharngitis/ pt not feeling better     Patient returns from yesterday following up on acute pharyngitis, with possiblle peritonsillar abscess.  Patient reports that his throat and neck pain, and dysphagia, has not gotten any better.  Not worsened.  Able to breathe through nose without difficulty.  Taking ibuprofen and tylenol, and the augmentin.  He was treated with rocephin initially which he though he was having improvement of his symptoms.  However it has returned.  No dizziness.  No drooling.   Patient Active Problem List   Diagnosis Date Noted  . Primary osteoarthritis of right knee 04/23/2016  . Preventative health care 05/28/2015  . Nonspecific abnormal electrocardiogram (ECG) (EKG) 05/28/2015  . Bimalleolar fracture of right ankle 11/08/2014  . Ankle fracture, bimalleolar, closed 11/08/2014  . DEPRESSION 05/19/2007  . ALLERGIC RHINITIS 05/19/2007  . ASTHMA 05/19/2007  . ANKLE PAIN, LEFT 05/19/2007    Past Medical History:  Diagnosis Date  . Arthritis   . Asthma    As a child no problems now    Past Surgical History:  Procedure Laterality Date  . KNEE SURGERY    . ORIF ANKLE FRACTURE Right 11/08/2014   Procedure: OPEN REDUCTION INTERNAL FIXATION (ORIF) RIGHT ANKLE ;  Surgeon: Vincent Every, MD;  Location: [facility];  Service: Orthopedics;  Laterality: Right;  . TOTAL KNEE ARTHROPLASTY Right 04/23/2016   Procedure: RIGHT TOTAL KNEE ARTHROPLASTY;  Surgeon: Vincent Every, MD;  Location: [facility];  Service: Orthopedics;  Laterality: Right;    Social History  Substance Use Topics  . Smoking status: Never Smoker  .  Smokeless tobacco: Never Used  . Alcohol use Yes     Comment: occasionally     Family History  Problem Relation Age of Onset  . Dementia Mother   . Colon cancer Neg Hx     No Known Allergies  Medication list has been reviewed and updated.  Current Outpatient Prescriptions on File Prior to Visit  Medication Sig Dispense Refill  . amoxicillin-clavulanate (AUGMENTIN) 875-125 MG tablet Take 1 tablet by mouth 2 (two) times daily. 20 tablet 0  . aspirin EC 81 MG EC tablet Take 1 tablet (81 mg total) by mouth 2 (two) times daily. 60 tablet 1  . oxyCODONE (OXY IR/ROXICODONE) 5 MG immediate release tablet Take 1-2 tablets (5-10 mg total) by mouth Ray 4 (four) hours as needed for severe pain. 40 tablet 0   No current facility-administered medications on file prior to visit.     ROS ROS otherwise unremarkable unless listed above.  Physical Examination: BP 135/84   Pulse 93   Temp 99.2 F (37.3 C) (Oral)   Resp 16   Ht 6\' 3"  (1.905 m)   Wt 214 lb (97.1 kg)   SpO2 96%   BMI 26.75 kg/m  Ideal Body Weight: Weight in (lb) to have BMI = 25: 199.6  Physical Exam  Constitutional: He is oriented to person, place, and time. He appears well-developed and well-nourished. No distress.  HENT:  Head: Normocephalic and atraumatic.  Right Ear: Tympanic membrane, external ear and  ear canal normal.  Left Ear: Tympanic membrane, external ear and ear canal normal.  Nose: No mucosal edema or rhinorrhea. Right sinus exhibits no maxillary sinus tenderness and no frontal sinus tenderness. Left sinus exhibits no maxillary sinus tenderness and no frontal sinus tenderness.  Mouth/Throat: No uvula swelling. Posterior oropharyngeal edema and posterior oropharyngeal erythema present. No oropharyngeal exudate.  Significant right tonsillar swelling, with uvula edematous as well.  Erythema. Right anterior neck swelling at the tonsillar lymph site.  Tenderness.  Eyes: Pupils are equal, round, and reactive to  light. Conjunctivae, EOM and lids are normal. Right eye exhibits normal extraocular motion. Left eye exhibits normal extraocular motion.  Neck: Trachea normal and full passive range of motion without pain. No edema and no erythema present.  Cardiovascular: Normal rate.   Pulmonary/Chest: Effort normal. No respiratory distress. He has no decreased breath sounds. He has no wheezes. He has no rhonchi.  Neurological: He is alert and oriented to person, place, and time.  Skin: Skin is warm and dry. He is not diaphoretic.  Psychiatric: He has a normal mood and affect. His behavior is normal.    Results for orders placed or performed in visit on 12/31/16  POCT CBC  Result Value Ref Range   WBC 11.9 (A) 4.6 - 10.2 K/uL   Lymph, poc 2.2 0.6 - 3.4   POC LYMPH PERCENT 18.3 10 - 50 %L   MID (cbc) 0.8 0 - 0.9   POC MID % 7.1 0 - 12 %M   POC Granulocyte 8.9 (A) 2 - 6.9   Granulocyte percent 74.6 37 - 80 %G   RBC 5.01 4.69 - 6.13 M/uL   Hemoglobin 14.7 14.1 - 18.1 g/dL   HCT, POC 28.4 13.2 - 53.7 %   MCV 90.1 80 - 97 fL   MCH, POC 29.3 27 - 31.2 pg   MCHC 32.6 31.8 - 35.4 g/dL   RDW, POC 44.0 %   Platelet Count, POC 264 142 - 424 K/uL   MPV 7.9 0 - 99.8 fL     Assessment and Plan: Vincent Ray is a 61 y.o. male who is here today for throat pain. --coordinated care with ENT for 3 hours later.  Given rocephin 1g prior to leaving.  Discussed alarming sxs to warrant immediate return.   Peritonsillar abscess - Plan: cefTRIAXone (ROCEPHIN) injection 1 g, Ambulatory referral to ENT, CANCELED: PSA  Throat pain - Plan: POCT CBC, CANCELED: PSA  Leukocytosis, unspecified type - Plan: POCT CBC, CANCELED: PSA  Trena Platt, PA-C [facility] 8/11/20188:20 AM

## 2016-12-31 NOTE — Patient Instructions (Addendum)
Please make sure you are at [facility] ear nose and throat at 1:15pm for your appointment to see Dr. Annalee Genta at [facility] Address [address]    IF you received an x-ray today, you will receive an invoice from [facility]. Please contact [facility] at [phone] with questions or concerns regarding your invoice.   IF you received labwork today, you will receive an invoice from [facility]. Please contact [facility] at [phone] with questions or concerns regarding your invoice.   Our billing staff will not be able to assist you with questions regarding bills from these companies.  You will be contacted with the lab results as soon as they are available. The fastest way to get your results is to activate your My Chart account. Instructions are located on the last page of this paperwork. If you have not heard from [place] regarding the results in 2 weeks, please contact this office.

## 2017-01-01 ENCOUNTER — Encounter: Payer: Self-pay | Admitting: Physician Assistant

## 2017-01-03 LAB — CULTURE, GROUP A STREP

## 2017-07-18 ENCOUNTER — Ambulatory Visit: Payer: BLUE CROSS/BLUE SHIELD | Admitting: Podiatry

## 2017-08-05 DIAGNOSIS — Z0000 Encounter for general adult medical examination without abnormal findings: Secondary | ICD-10-CM | POA: Diagnosis not present

## 2017-08-15 ENCOUNTER — Ambulatory Visit (INDEPENDENT_AMBULATORY_CARE_PROVIDER_SITE_OTHER): Payer: BLUE CROSS/BLUE SHIELD

## 2017-08-15 ENCOUNTER — Encounter: Payer: Self-pay | Admitting: Podiatry

## 2017-08-15 ENCOUNTER — Ambulatory Visit: Payer: BLUE CROSS/BLUE SHIELD | Admitting: Podiatry

## 2017-08-15 ENCOUNTER — Other Ambulatory Visit: Payer: Self-pay | Admitting: Podiatry

## 2017-08-15 DIAGNOSIS — M779 Enthesopathy, unspecified: Secondary | ICD-10-CM

## 2017-08-15 DIAGNOSIS — R600 Localized edema: Secondary | ICD-10-CM

## 2017-08-15 DIAGNOSIS — R52 Pain, unspecified: Secondary | ICD-10-CM

## 2017-08-18 NOTE — Progress Notes (Signed)
   Subjective:  62 year old male presenting today as a new patient with a chief complaint of intermittent pain and swelling to the medial and lateral aspects of the right ankle and foot that has been ongoing for the past three years. He reports associated pain to the first MPJ of the right foot as well. He states he had surgery on the ankle in 2016 secondary to a fracture. Walking and bearing weight increases his symptoms. He has not done anything to treat the pain. Patient is here for further evaluation and treatment.   Past Medical History:  Diagnosis Date  . Arthritis   . Asthma    As a child no problems now      Objective / Physical Exam:  General:  The patient is alert and oriented x3 in no acute distress. Dermatology:  Skin is warm, dry and supple bilateral lower extremities. Negative for open lesions or macerations. Vascular:  Palpable pedal pulses bilaterally. No edema or erythema noted. Capillary refill within normal limits. Neurological:  Epicritic and protective threshold grossly intact bilaterally.  Musculoskeletal Exam:  Pain on palpation to the anterior lateral medial aspects of the patient's right ankle. Mild edema noted. Range of motion within normal limits to all pedal and ankle joints bilateral. Muscle strength 5/5 in all groups bilateral.   Radiographic Exam:  Orthopedic hardware and osteotomies sites appear to be stable with complete healing of fractures.   Assessment: #1 intermittent right ankle swelling #2 h/o ORIF right ankle fracture - 2016  Plan of Care:  #1 Patient was evaluated. X-Rays reviewed.  #2 Recommended conservative treatment including good shoe gear and compression socks.  #3 Compression anklet dispensed.  #4 Return to clinic as needed.   Felecia Shelling, DPM [facility]  Dr. Felecia Shelling, DPM    [address]                                        [place], [place] [address]                Office [phone]  Fax  [phone]

## 2017-08-24 ENCOUNTER — Encounter: Payer: Self-pay | Admitting: Physician Assistant

## 2018-01-24 ENCOUNTER — Encounter (HOSPITAL_COMMUNITY): Payer: Self-pay | Admitting: Emergency Medicine

## 2018-01-24 ENCOUNTER — Ambulatory Visit (HOSPITAL_COMMUNITY)
Admission: EM | Admit: 2018-01-24 | Discharge: 2018-01-24 | Disposition: A | Discharge status: Home / Self Care | Payer: BLUE CROSS/BLUE SHIELD | Attending: Family Medicine | Admitting: Family Medicine

## 2018-01-24 ENCOUNTER — Ambulatory Visit (INDEPENDENT_AMBULATORY_CARE_PROVIDER_SITE_OTHER): Payer: BLUE CROSS/BLUE SHIELD

## 2018-01-24 DIAGNOSIS — R1030 Lower abdominal pain, unspecified: Secondary | ICD-10-CM | POA: Diagnosis not present

## 2018-01-24 DIAGNOSIS — K5900 Constipation, unspecified: Secondary | ICD-10-CM | POA: Diagnosis not present

## 2018-01-24 MED ORDER — MAGNESIUM CITRATE PO SOLN: 1.0000 | Freq: Once | ORAL | 1 refills | Status: AC

## 2018-01-24 NOTE — Discharge Instructions (Signed)
It was nice meeting you!!  Your x ray showed some stool but no bowel obstruction. I will send you some magnesium citrate to the pharmacy to treat this. If no improvement in the next 24 hours you may want to try an enema.  You can get these at the drug store.  For worsening symptoms please go to the ER.

## 2018-01-24 NOTE — ED Provider Notes (Signed)
[facility]    CSN: [id number] Arrival date & time: 01/24/18  1205     History   Chief Complaint Chief Complaint  Patient presents with  . Abdominal Pain    HPI Vincent Ray is a 62 y.o. male.   Patient is an otherwise healthy 62 year old male presents with 4 days of generalized abdominal discomfort, cramping, chills, nausea, body aches.  He reports the symptoms have worsened since Saturday.  He has not had a bowel movement since Saturday.  He denies any recent sick contacts.  He denies any history of gastrointestinal problems.  He has had trouble passing gas and a lot of bloating.  Water but unable to eat.  His wife gave him Pepto-Bismol last night with no relief.  Not taking anything for constipation.  Denies any urinary symptoms  Family hx of dementia and colon cancer.  He does not smoke. He does not drink. No drug use.        Past Medical History:  Diagnosis Date  . Arthritis   . Asthma    As a child no problems now    Patient Active Problem List   Diagnosis Date Noted  . Primary osteoarthritis of right knee 04/23/2016  . Preventative health care 05/28/2015  . Nonspecific abnormal electrocardiogram (ECG) (EKG) 05/28/2015  . Bimalleolar fracture of right ankle 11/08/2014  . Ankle fracture, bimalleolar, closed 11/08/2014  . DEPRESSION 05/19/2007  . ALLERGIC RHINITIS 05/19/2007  . ASTHMA 05/19/2007  . ANKLE PAIN, LEFT 05/19/2007    Past Surgical History:  Procedure Laterality Date  . KNEE SURGERY    . ORIF ANKLE FRACTURE Right 11/08/2014   Procedure: OPEN REDUCTION INTERNAL FIXATION (ORIF) RIGHT ANKLE ;  Surgeon: Jene Every, MD;  Location: [facility];  Service: Orthopedics;  Laterality: Right;  . TOTAL KNEE ARTHROPLASTY Right 04/23/2016   Procedure: RIGHT TOTAL KNEE ARTHROPLASTY;  Surgeon: Jene Every, MD;  Location: [facility];  Service: Orthopedics;  Laterality: Right;       Home Medications    Prior to Admission medications     Medication Sig Start Date End Date Taking? Authorizing Provider  magnesium citrate SOLN Take 296 mLs (1 Bottle total) by mouth once for 1 dose. 01/24/18 01/24/18  Janace Aris, NP    Family History Family History  Problem Relation Age of Onset  . Dementia Mother   . Colon cancer Neg Hx     Social History Social History   Tobacco Use  . Smoking status: Never Smoker  . Smokeless tobacco: Never Used  Substance Use Topics  . Alcohol use: Yes    Comment: occasionally   . Drug use: No     Allergies   Patient has no known allergies.   Review of Systems Review of Systems  Constitutional: Positive for appetite change, chills and fatigue. Negative for fever.  Respiratory: Negative for chest tightness and shortness of breath.   Cardiovascular: Negative for chest pain.  Gastrointestinal: Positive for abdominal distention, abdominal pain, constipation and nausea. Negative for blood in stool, diarrhea and vomiting.  Genitourinary: Negative for difficulty urinating, dysuria, flank pain, frequency and hematuria.  Musculoskeletal: Negative for back pain.  Skin: Negative for color change, pallor and rash.  Hematological: Does not bruise/bleed easily.  All other systems reviewed and are negative.    Physical Exam Triage Vital Signs ED Triage Vitals [01/24/18 1240]  Enc Vitals Group     BP 140/85     Pulse Rate (!) 101     Resp  18     Temp 97.9 F (36.6 C)     Temp Source Oral     SpO2 98 %     Weight      Height      Head Circumference      Peak Flow      Pain Score      Pain Loc      Pain Edu?      Excl. in GC?    No data found.  Updated Vital Signs BP 140/85 (BP Location: Left Arm)   Pulse (!) 101   Temp 97.9 F (36.6 C) (Oral)   Resp 18   SpO2 98%   Visual Acuity Right Eye Distance:   Left Eye Distance:   Bilateral Distance:    Right Eye Near:   Left Eye Near:    Bilateral Near:     Physical Exam  Constitutional: He is oriented to person, place, and  time. He appears well-developed and well-nourished.  Pleasant.  Ill-appearing  HENT:  Head: Normocephalic and atraumatic.  Pulmonary/Chest: Effort normal.  Abdominal: Bowel sounds are decreased. There is no hepatosplenomegaly or splenomegaly. There is tenderness in the suprapubic area, left upper quadrant and left lower quadrant. There is no CVA tenderness and negative Murphy's sign. No hernia.  Abdomen soft.  Very tender to left upper quadrant and left lower quadrant. Mild distension to the lower abdomen with tenderness. No rebound.   Neurological: He is alert and oriented to person, place, and time.  Skin: Skin is warm and dry.  Psychiatric: He has a normal mood and affect. His behavior is normal.  Nursing note and vitals reviewed.    UC Treatments / Results  Labs (all labs ordered are listed, but only abnormal results are displayed) Labs Reviewed - No data to display  EKG None  Radiology Dg Abd 2 Views  Result Date: 01/24/2018 CLINICAL DATA:  Lower abdominal pain x4 days, constipation EXAM: ABDOMEN - 2 VIEW COMPARISON:  None. FINDINGS: Nonobstructive bowel gas pattern. No evidence of free air under the diaphragm on the upright view. Degenerative changes of the lumbar spine with mild dextroscoliosis. IMPRESSION: No evidence of small bowel obstruction or free air. Electronically Signed   By: Charline Bills M.D.   On: 01/24/2018 13:31    Procedures Procedures (including critical care time)  Medications Ordered in UC Medications - No data to display  Initial Impression / Assessment and Plan / UC Course  I have reviewed the triage vital signs and the nursing notes.  Pertinent labs & imaging results that were available during my care of the patient were reviewed by me and considered in my medical decision making (see chart for details).    Xray negative for bowel obstruction. Some stool burden. Patient very tender in the left upper and lower quadrants where stool was seen on  the x-ray.  No rebound tenderness.  Nontoxic but does appear ill and in pain. Will try magnesium citrate to see if this produces a BM.  If not, patient instructed to try an enema If no relief from this or if he develops worsening symptoms to include severe abdominal pain with vomiting he will need to go to the ER. Patient agreeable and understanding of plan. Final Clinical Impressions(s) / UC Diagnoses   Final diagnoses:  Constipation, unspecified constipation type     Discharge Instructions     It was nice meeting you!!  Your x ray showed some stool but no bowel obstruction. I will  send you some magnesium citrate to the pharmacy to treat this. If no improvement in the next 24 hours you may want to try an enema.  You can get these at the drug store.  For worsening symptoms please go to the ER.      ED Prescriptions    Medication Sig Dispense Auth. Provider   magnesium citrate SOLN Take 296 mLs (1 Bottle total) by mouth once for 1 dose. 195 mL Dahlia Byes A, NP     Controlled Substance Prescriptions [place] Controlled Substance Registry consulted? Not Applicable   Janace Aris, NP 01/24/18 1407

## 2018-01-24 NOTE — ED Triage Notes (Signed)
Pt sts abd cramping and constipation; pt sts no BM since Saturday; pt sts some nausea and body aches

## 2018-01-30 DIAGNOSIS — M79671 Pain in right foot: Secondary | ICD-10-CM | POA: Diagnosis not present

## 2018-01-30 DIAGNOSIS — M67969 Unspecified disorder of synovium and tendon, unspecified lower leg: Secondary | ICD-10-CM | POA: Diagnosis not present

## 2018-01-30 DIAGNOSIS — M2021 Hallux rigidus, right foot: Secondary | ICD-10-CM | POA: Diagnosis not present

## 2018-01-30 DIAGNOSIS — M25571 Pain in right ankle and joints of right foot: Secondary | ICD-10-CM | POA: Diagnosis not present

## 2018-03-06 ENCOUNTER — Other Ambulatory Visit (HOSPITAL_COMMUNITY): Payer: Self-pay | Admitting: Orthopedic Surgery

## 2018-03-11 DIAGNOSIS — B349 Viral infection, unspecified: Secondary | ICD-10-CM | POA: Diagnosis not present

## 2018-03-11 DIAGNOSIS — R110 Nausea: Secondary | ICD-10-CM | POA: Diagnosis not present

## 2018-03-11 DIAGNOSIS — R51 Headache: Secondary | ICD-10-CM | POA: Diagnosis not present

## 2018-04-28 ENCOUNTER — Encounter (HOSPITAL_BASED_OUTPATIENT_CLINIC_OR_DEPARTMENT_OTHER): Payer: Self-pay | Admitting: *Deleted

## 2018-04-28 ENCOUNTER — Other Ambulatory Visit: Payer: Self-pay

## 2018-05-04 ENCOUNTER — Ambulatory Visit (HOSPITAL_BASED_OUTPATIENT_CLINIC_OR_DEPARTMENT_OTHER)
Admission: RE | Admit: 2018-05-04 | Discharge: 2018-05-04 | Disposition: A | Discharge status: Home / Self Care | Payer: BLUE CROSS/BLUE SHIELD | Source: Ambulatory Visit | Attending: Orthopedic Surgery | Admitting: Orthopedic Surgery

## 2018-05-04 ENCOUNTER — Ambulatory Visit (HOSPITAL_BASED_OUTPATIENT_CLINIC_OR_DEPARTMENT_OTHER): Payer: BLUE CROSS/BLUE SHIELD | Admitting: Certified Registered"

## 2018-05-04 ENCOUNTER — Encounter (HOSPITAL_BASED_OUTPATIENT_CLINIC_OR_DEPARTMENT_OTHER): Payer: Self-pay | Admitting: *Deleted

## 2018-05-04 ENCOUNTER — Encounter (HOSPITAL_BASED_OUTPATIENT_CLINIC_OR_DEPARTMENT_OTHER)
Admission: RE | Disposition: A | Discharge status: Home / Self Care | Payer: Self-pay | Source: Ambulatory Visit | Attending: Orthopedic Surgery

## 2018-05-04 DIAGNOSIS — M1990 Unspecified osteoarthritis, unspecified site: Secondary | ICD-10-CM | POA: Diagnosis not present

## 2018-05-04 DIAGNOSIS — T8484XA Pain due to internal orthopedic prosthetic devices, implants and grafts, initial encounter: Secondary | ICD-10-CM | POA: Diagnosis not present

## 2018-05-04 DIAGNOSIS — Y831 Surgical operation with implant of artificial internal device as the cause of abnormal reaction of the patient, or of later complication, without mention of misadventure at the time of the procedure: Secondary | ICD-10-CM | POA: Diagnosis not present

## 2018-05-04 DIAGNOSIS — G8918 Other acute postprocedural pain: Secondary | ICD-10-CM | POA: Diagnosis not present

## 2018-05-04 DIAGNOSIS — Z8709 Personal history of other diseases of the respiratory system: Secondary | ICD-10-CM | POA: Diagnosis not present

## 2018-05-04 DIAGNOSIS — M2021 Hallux rigidus, right foot: Secondary | ICD-10-CM | POA: Diagnosis not present

## 2018-05-04 DIAGNOSIS — M6701 Short Achilles tendon (acquired), right ankle: Secondary | ICD-10-CM | POA: Insufficient documentation

## 2018-05-04 DIAGNOSIS — M62461 Contracture of muscle, right lower leg: Secondary | ICD-10-CM | POA: Diagnosis not present

## 2018-05-04 DIAGNOSIS — M76821 Posterior tibial tendinitis, right leg: Secondary | ICD-10-CM | POA: Diagnosis not present

## 2018-05-04 DIAGNOSIS — M94261 Chondromalacia, right knee: Secondary | ICD-10-CM | POA: Diagnosis not present

## 2018-05-04 HISTORY — PX: HARDWARE REMOVAL: SHX979

## 2018-05-04 HISTORY — PX: GASTROC RECESSION EXTREMITY: SHX6262

## 2018-05-04 HISTORY — PX: CALCANEAL OSTEOTOMY: SHX1281

## 2018-05-04 HISTORY — DX: Hallux rigidus, right foot: M20.21

## 2018-05-04 SURGERY — RECESSION, TENDON, GASTROCNEMIUS
Anesthesia: General | Site: Leg Lower | Laterality: Right

## 2018-05-04 MED ORDER — SODIUM CHLORIDE 0.9 % IV SOLN: INTRAVENOUS | Status: DC

## 2018-05-04 MED ORDER — MIDAZOLAM HCL 2 MG/2ML IJ SOLN
1.0000 mg | INTRAMUSCULAR | Status: DC | PRN
  Administered 2018-05-04: 2 mg via INTRAVENOUS

## 2018-05-04 MED ORDER — OXYCODONE HCL 5 MG PO TABS
5.0000 mg | ORAL_TABLET | Freq: Once | ORAL | Status: AC
  Administered 2018-05-04: 5 mg via ORAL

## 2018-05-04 MED ORDER — SCOPOLAMINE 1 MG/3DAYS TD PT72: 1.0000 | MEDICATED_PATCH | Freq: Once | TRANSDERMAL | Status: DC | PRN

## 2018-05-04 MED ORDER — CEFAZOLIN SODIUM-DEXTROSE 2-4 GM/100ML-% IV SOLN
INTRAVENOUS | Status: AC
  Filled 2018-05-04: qty 100

## 2018-05-04 MED ORDER — OXYCODONE HCL 5 MG PO TABS
ORAL_TABLET | ORAL | Status: AC
  Filled 2018-05-04: qty 1

## 2018-05-04 MED ORDER — DOCUSATE SODIUM 100 MG PO CAPS: 100.0000 mg | ORAL_CAPSULE | Freq: Two times a day (BID) | ORAL | 0 refills | Status: DC

## 2018-05-04 MED ORDER — CHLORHEXIDINE GLUCONATE 4 % EX LIQD: 60.0000 mL | Freq: Once | CUTANEOUS | Status: DC

## 2018-05-04 MED ORDER — ONDANSETRON HCL 4 MG/2ML IJ SOLN
INTRAMUSCULAR | Status: DC | PRN
  Administered 2018-05-04: 4 mg via INTRAVENOUS

## 2018-05-04 MED ORDER — SENNA 8.6 MG PO TABS: 2.0000 | ORAL_TABLET | Freq: Two times a day (BID) | ORAL | 0 refills | Status: DC

## 2018-05-04 MED ORDER — LACTATED RINGERS IV SOLN
INTRAVENOUS | Status: DC
  Administered 2018-05-04 (×2): via INTRAVENOUS

## 2018-05-04 MED ORDER — ASPIRIN EC 81 MG PO TBEC: 81.0000 mg | DELAYED_RELEASE_TABLET | Freq: Two times a day (BID) | ORAL | 0 refills | Status: DC

## 2018-05-04 MED ORDER — OXYCODONE HCL 5 MG PO TABS: 5.0000 mg | ORAL_TABLET | ORAL | 0 refills | Status: AC | PRN

## 2018-05-04 MED ORDER — FENTANYL CITRATE (PF) 100 MCG/2ML IJ SOLN
INTRAMUSCULAR | Status: AC
  Filled 2018-05-04: qty 2

## 2018-05-04 MED ORDER — 0.9 % SODIUM CHLORIDE (POUR BTL) OPTIME
TOPICAL | Status: DC | PRN
  Administered 2018-05-04: 200 mL

## 2018-05-04 MED ORDER — BUPIVACAINE-EPINEPHRINE (PF) 0.5% -1:200000 IJ SOLN
INTRAMUSCULAR | Status: DC | PRN
  Administered 2018-05-04: 30 mL via PERINEURAL

## 2018-05-04 MED ORDER — BUPIVACAINE HCL (PF) 0.5 % IJ SOLN
INTRAMUSCULAR | Status: DC | PRN
  Administered 2018-05-04: 10 mL

## 2018-05-04 MED ORDER — PROPOFOL 500 MG/50ML IV EMUL
INTRAVENOUS | Status: DC | PRN
  Administered 2018-05-04: 25 ug/kg/min via INTRAVENOUS

## 2018-05-04 MED ORDER — CEFAZOLIN SODIUM-DEXTROSE 2-4 GM/100ML-% IV SOLN
2.0000 g | INTRAVENOUS | Status: AC
  Administered 2018-05-04: 2 g via INTRAVENOUS

## 2018-05-04 MED ORDER — DEXAMETHASONE SODIUM PHOSPHATE 10 MG/ML IJ SOLN
INTRAMUSCULAR | Status: DC | PRN
  Administered 2018-05-04: 10 mg via INTRAVENOUS

## 2018-05-04 MED ORDER — PROPOFOL 10 MG/ML IV BOLUS
INTRAVENOUS | Status: DC | PRN
  Administered 2018-05-04: 200 mg via INTRAVENOUS

## 2018-05-04 MED ORDER — MIDAZOLAM HCL 2 MG/2ML IJ SOLN
INTRAMUSCULAR | Status: AC
  Filled 2018-05-04: qty 2

## 2018-05-04 MED ORDER — FENTANYL CITRATE (PF) 100 MCG/2ML IJ SOLN
50.0000 ug | INTRAMUSCULAR | Status: DC | PRN
  Administered 2018-05-04: 100 ug via INTRAVENOUS

## 2018-05-04 MED ORDER — LIDOCAINE HCL (CARDIAC) PF 100 MG/5ML IV SOSY
PREFILLED_SYRINGE | INTRAVENOUS | Status: DC | PRN
  Administered 2018-05-04: 30 mg via INTRAVENOUS

## 2018-05-04 SURGICAL SUPPLY — 95 items
BANDAGE ACE 4X5 VEL STRL LF (GAUZE/BANDAGES/DRESSINGS) IMPLANT
BANDAGE ESMARK 6X9 LF (GAUZE/BANDAGES/DRESSINGS) IMPLANT
BENZOIN TINCTURE PRP APPL 2/3 (GAUZE/BANDAGES/DRESSINGS) IMPLANT
BIT DRILL 2.4 AO COUPLING CANN (BIT) ×5 IMPLANT
BLADE AVERAGE 25X9 (BLADE) IMPLANT
BLADE MICRO SAGITTAL (BLADE) ×5 IMPLANT
BLADE SURG 15 STRL LF DISP TIS (BLADE) ×12 IMPLANT
BLADE SURG 15 STRL SS (BLADE) ×3
BNDG COHESIVE 4X5 TAN STRL (GAUZE/BANDAGES/DRESSINGS) ×5 IMPLANT
BNDG COHESIVE 6X5 TAN STRL LF (GAUZE/BANDAGES/DRESSINGS) ×5 IMPLANT
BNDG ESMARK 4X9 LF (GAUZE/BANDAGES/DRESSINGS) IMPLANT
BNDG ESMARK 6X9 LF (GAUZE/BANDAGES/DRESSINGS)
BOOT STEPPER DURA LG (SOFTGOODS) IMPLANT
BOOT STEPPER DURA MED (SOFTGOODS) IMPLANT
BOOT STEPPER DURA XLG (SOFTGOODS) IMPLANT
CANISTER SUCT 1200ML W/VALVE (MISCELLANEOUS) ×5 IMPLANT
CHLORAPREP W/TINT 26ML (MISCELLANEOUS) ×5 IMPLANT
COVER BACK TABLE 60X90IN (DRAPES) ×5 IMPLANT
COVER WAND RF STERILE (DRAPES) IMPLANT
CUFF TOURNIQUET SINGLE 34IN LL (TOURNIQUET CUFF) ×5 IMPLANT
DECANTER SPIKE VIAL GLASS SM (MISCELLANEOUS) IMPLANT
DRAPE EXTREMITY T 121X128X90 (DRAPE) ×5 IMPLANT
DRAPE OEC MINIVIEW 54X84 (DRAPES) ×5 IMPLANT
DRAPE SURG 17X23 STRL (DRAPES) IMPLANT
DRAPE U-SHAPE 47X51 STRL (DRAPES) ×5 IMPLANT
DRSG MEPITEL 4X7.2 (GAUZE/BANDAGES/DRESSINGS) ×5 IMPLANT
DRSG PAD ABDOMINAL 8X10 ST (GAUZE/BANDAGES/DRESSINGS) ×10 IMPLANT
ELECT REM PT RETURN 9FT ADLT (ELECTROSURGICAL) ×5
ELECTRODE REM PT RTRN 9FT ADLT (ELECTROSURGICAL) ×4 IMPLANT
GAUZE SPONGE 4X4 12PLY STRL (GAUZE/BANDAGES/DRESSINGS) ×5 IMPLANT
GLOVE BIO SURGEON STRL SZ 6.5 (GLOVE) ×5 IMPLANT
GLOVE BIO SURGEON STRL SZ8 (GLOVE) ×5 IMPLANT
GLOVE BIOGEL PI IND STRL 7.0 (GLOVE) ×8 IMPLANT
GLOVE BIOGEL PI IND STRL 8 (GLOVE) ×8 IMPLANT
GLOVE BIOGEL PI INDICATOR 7.0 (GLOVE) ×2
GLOVE BIOGEL PI INDICATOR 8 (GLOVE) ×2
GLOVE ECLIPSE 8.0 STRL XLNG CF (GLOVE) ×5 IMPLANT
GOWN STRL REUS W/ TWL LRG LVL3 (GOWN DISPOSABLE) ×4 IMPLANT
GOWN STRL REUS W/ TWL XL LVL3 (GOWN DISPOSABLE) ×8 IMPLANT
GOWN STRL REUS W/TWL LRG LVL3 (GOWN DISPOSABLE) ×1
GOWN STRL REUS W/TWL XL LVL3 (GOWN DISPOSABLE) ×2
K-WIRE ACE 1.6X6 (WIRE)
K-WIRE TROC 1.25X150 (WIRE) ×5
KIT ACCESSORY DRILL 5 (KITS) ×5 IMPLANT
KWIRE ACE 1.6X6 (WIRE) IMPLANT
KWIRE TROC 1.25X150 (WIRE) ×4 IMPLANT
NDL SUT 6 .5 CRC .975X.05 MAYO (NEEDLE) ×4 IMPLANT
NEEDLE HYPO 22GX1.5 SAFETY (NEEDLE) IMPLANT
NEEDLE HYPO 25X1 1.5 SAFETY (NEEDLE) IMPLANT
NEEDLE MAYO TAPER (NEEDLE) ×1
NS IRRIG 1000ML POUR BTL (IV SOLUTION) ×5 IMPLANT
PACK BASIN DAY SURGERY FS (CUSTOM PROCEDURE TRAY) ×5 IMPLANT
PAD CAST 4YDX4 CTTN HI CHSV (CAST SUPPLIES) ×4 IMPLANT
PADDING CAST ABS 4INX4YD NS (CAST SUPPLIES)
PADDING CAST ABS COTTON 4X4 ST (CAST SUPPLIES) IMPLANT
PADDING CAST COTTON 4X4 STRL (CAST SUPPLIES) ×1
PADDING CAST COTTON 6X4 STRL (CAST SUPPLIES) ×5 IMPLANT
PASSER SUT SWANSON 36MM LOOP (INSTRUMENTS) IMPLANT
PENCIL BUTTON HOLSTER BLD 10FT (ELECTRODE) ×5 IMPLANT
RETRIEVER SUT HEWSON (MISCELLANEOUS) IMPLANT
SANITIZER HAND PURELL 535ML FO (MISCELLANEOUS) ×5 IMPLANT
SCREW CANN 4.0X50 (Screw) ×2 IMPLANT
SCREW CANN PT 50X4 NS SM (Screw) ×8 IMPLANT
SCREW PEEK TENODESIS 6X12MM (Screw) ×5 IMPLANT
SHEET MEDIUM DRAPE 40X70 STRL (DRAPES) ×5 IMPLANT
SLEEVE SCD COMPRESS KNEE MED (MISCELLANEOUS) ×5 IMPLANT
SPLINT FAST PLASTER 5X30 (CAST SUPPLIES) ×20
SPLINT PLASTER CAST FAST 5X30 (CAST SUPPLIES) ×80 IMPLANT
SPONGE LAP 18X18 RF (DISPOSABLE) ×5 IMPLANT
STOCKINETTE 6  STRL (DRAPES) ×1
STOCKINETTE 6 STRL (DRAPES) ×4 IMPLANT
STRIP CLOSURE SKIN 1/2X4 (GAUZE/BANDAGES/DRESSINGS) IMPLANT
SUCTION FRAZIER HANDLE 10FR (MISCELLANEOUS) ×1
SUCTION TUBE FRAZIER 10FR DISP (MISCELLANEOUS) ×4 IMPLANT
SUT BONE WAX W31G (SUTURE) IMPLANT
SUT ETHIBOND 2 OS 4 DA (SUTURE) IMPLANT
SUT ETHILON 3 0 PS 1 (SUTURE) ×10 IMPLANT
SUT FIBERWIRE #2 38 T-5 BLUE (SUTURE)
SUT FIBERWIRE 2-0 18 17.9 3/8 (SUTURE)
SUT MERSILENE 2.0 SH NDLE (SUTURE) IMPLANT
SUT MNCRL AB 3-0 PS2 18 (SUTURE) ×5 IMPLANT
SUT VIC AB 0 SH 27 (SUTURE) ×10 IMPLANT
SUT VIC AB 2-0 SH 18 (SUTURE) IMPLANT
SUT VIC AB 2-0 SH 27 (SUTURE)
SUT VIC AB 2-0 SH 27XBRD (SUTURE) IMPLANT
SUTURE FIBERWR #2 38 T-5 BLUE (SUTURE) IMPLANT
SUTURE FIBERWR 2-0 18 17.9 3/8 (SUTURE) IMPLANT
SUTURE TAPE 1.3 FIBERLOP 20 ST (SUTURE) IMPLANT
SUTURETAPE 1.3 FIBERLOOP 20 ST (SUTURE)
SYR BULB 3OZ (MISCELLANEOUS) ×5 IMPLANT
SYR CONTROL 10ML LL (SYRINGE) IMPLANT
TOWEL GREEN STERILE FF (TOWEL DISPOSABLE) ×10 IMPLANT
TUBE CONNECTING 20X1/4 (TUBING) ×5 IMPLANT
UNDERPAD 30X30 (UNDERPADS AND DIAPERS) ×5 IMPLANT
YANKAUER SUCT BULB TIP NO VENT (SUCTIONS) IMPLANT

## 2018-05-04 NOTE — Op Note (Signed)
05/04/2018  12:43 PM  PATIENT:  Vincent Ray  62 y.o. male  PRE-OPERATIVE DIAGNOSIS: 1.  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  Short right Achilles tendon 3.  Painful hardware right medial malleolus  POST-OPERATIVE DIAGNOSIS: Same  Procedure(s): 1.  RIGHT GASTROC RECESSION 2.  Right MEDIALIZING CALCANEAL OSTEOTOMY 3.  MEDIAL MALLEOLUS HARDWARE REMOVAL 4.  RIGHT POSTERIOR TIBIALIS TENDON TENOLYSIS 5.  RIGHT FLEXOR DIGITORUM LONGUS TRANSFER TO NAVICULAR 6.  Right foot AP, lateral and Harris heel radiographs  SURGEON:  Toni Arthurs, MD  ASSISTANT: Alfredo Martinez, PA-C  ANESTHESIA:   General, regional  EBL:  minimal   TOURNIQUET:   Total Tourniquet Time Documented: Thigh (Right) - 72 minutes Total: Thigh (Right) - 72 minutes  COMPLICATIONS:  None apparent  DISPOSITION:  Extubated, awake and stable to recovery.  INDICATION FOR PROCEDURE: The patient is a 62 year old male with stage II posterior tibial tendon dysfunction on the right side.  He has a short Achilles tendon as well as painful retained hardware at the medial malleolus.  He has failed nonoperative treatment to date including activity modification, oral anti-inflammatories and shoewear modification.  He presents now for operative treatment of this painful condition.  The risks and benefits of the alternative treatment options have been discussed in detail.  The patient wishes to proceed with surgery and specifically understands risks of bleeding, infection, nerve damage, blood clots, need for additional surgery, amputation and death.  PROCEDURE IN DETAIL:After pre operative consent was obtained, and the correct operative site was identified, the patient was brought to the operating room and placed supine on the OR table.  Anesthesia was administered.  Pre-operative antibiotics were administered.  A surgical timeout was taken.  The right lower extremity was prepped and draped in standard sterile  fashion with a tourniquet around the thigh.  The extremity was elevated and the tourniquet was inflated to 250 mmHg.  A longitudinal incision was made over the medial calf.  Dissection was carried down through the subcutaneous tissues.  The superficial fascia was incised.  The gastrocnemius tendon was identified.  Sural nerve was protected and the tendon was divided from medial to lateral under direct vision.  The wound was irrigated and closed with Monocryl and nylon.  Attention was turned to the lateral hindfoot where an oblique incision was made over the lateral wall of the calcaneus.  The periosteum was incised and elevated anteriorly.  A K wire was inserted at the isthmus.  A lateral radiograph confirmed appropriate position of the K wire.  K wire was then used as a guide to cut an oblique osteotomy through the isthmus.  The osteotomy was mobilized and the tuberosity was translated medially approximately 6 mm.  The osteotomy was then fixed with 2 4 mm Biomet cannulated screws.  Lateral and Harris heel radiographs confirmed appropriate position of the osteotomy.  The lateral wall was smoothed.  The wound was irrigated and closed with nylon.  Attention was then turned to the medial ankle.  The previous incision was identified at the medial malleolus it was made again sharply and the incision carried distally to the navicular.  Dissection was carried down through the subcutaneous tissues.  The posterior tibial tendon sheath was incised.  The tendon was noted to have significant scarring and synovitis.  This was debrided sharply with scissors and a scalpel.  The medial malleolus was then probed with a K wire.  The anterior screw was identified.  The screw was cleared of all  soft tissue and removed without difficulty.  The more posterior screw was then identified and removed in the same fashion.  The flexor digitorum longus tendon was identified.  It was dissected distally to the knot of Sherilyn Cooter.  It was  transected and whipstitched.  A K wire was inserted in the navicular tuberosity.  Radiographs confirmed appropriate position of the wire.  A cannulated reamer was used to create a hole in the medial navicular.  The suture passer was used to pull the flexor digitorum longus tendon from plantar to dorsal through the hole.  The tendon was tensioned and secured with a [employer] 6 mm bolt.  It was noted to have excellent purchase.  The tendon was then sutured to the adjacent periosteum.  Final AP, lateral and Harris heel radiographs confirmed appropriate position and length of all hardware.  The medial wound was irrigated.  The posterior tibial tendon sheath was closed with 0 Vicryl.  Subcutaneous tissues were approximated with 2-0 Vicryl.  Skin incision was closed with 3-0 nylon.  Sterile dressings were applied followed by well-padded short leg splint.  The tourniquet was released after application of the dressings.  The patient was awakened from anesthesia and transported to the recovery room in stable condition.   FOLLOW UP PLAN: Nonweightbearing on the right lower extremity in a short leg splint.  Aspirin 81 mg p.o. twice daily for DVT prophylaxis.  Follow-up in 2 weeks for suture removal and conversion to a short leg cast.   RADIOGRAPHS: AP, lateral and Harris heel radiographs of the right foot are obtained intraoperatively.  These show interval osteotomy of the calcaneus.  Hardware is appropriately positioned and of the appropriate lengths.    Alfredo Martinez PA-C was present and scrubbed for the duration of the operative case. His assistance was essential in positioning the patient, prepping and draping, gaining and maintaining exposure, performing the operation, closing and dressing the wounds and applying the splint.

## 2018-05-04 NOTE — Anesthesia Preprocedure Evaluation (Addendum)
Anesthesia Evaluation  Patient identified by MRN, date of birth, ID band Patient awake    Reviewed: Allergy & Precautions, H&P , NPO status , Patient's Chart, lab work & pertinent test results  Airway Mallampati: II  TM Distance: >3 FB Neck ROM: Full    Dental no notable dental hx. (+) Teeth Intact, Dental Advisory Given   Pulmonary asthma ,    Pulmonary exam normal breath sounds clear to auscultation       Cardiovascular negative cardio ROS   Rhythm:Regular Rate:Normal     Neuro/Psych Depression negative neurological ROS  negative psych ROS   GI/Hepatic negative GI ROS, Neg liver ROS,   Endo/Other  negative endocrine ROS  Renal/GU negative Renal ROS  negative genitourinary   Musculoskeletal  (+) Arthritis , Osteoarthritis,    Abdominal   Peds  Hematology negative hematology ROS (+)   Anesthesia Other Findings   Reproductive/Obstetrics negative OB ROS                            Anesthesia Physical Anesthesia Plan  ASA: II  Anesthesia Plan: General   Post-op Pain Management:  Regional for Post-op pain   Induction: Intravenous  PONV Risk Score and Plan: 3 and Ondansetron, Dexamethasone, Midazolam and Propofol infusion  Airway Management Planned: LMA  Additional Equipment:   Intra-op Plan:   Post-operative Plan: Extubation in OR  Informed Consent: I have reviewed the patients History and Physical, chart, labs and discussed the procedure including the risks, benefits and alternatives for the proposed anesthesia with the patient or authorized representative who has indicated his/her understanding and acceptance.   Dental advisory given  Plan Discussed with: CRNA  Anesthesia Plan Comments:         Anesthesia Quick Evaluation

## 2018-05-04 NOTE — H&P (Signed)
Vincent Ray is an 62 y.o. male.   Chief Complaint: right ankle pain HPI:   The patient is a 62 year old male without significant past medical history.  He has developed posterior tibial tendon dysfunction and has a history of ankle fracture with retained hardware at the medial malleolus.  He has failed nonoperative treatment to date including activity modification, oral anti-inflammatories, bracing and physical therapy.  He presents for operative treatment of this painful condition.  Past Medical History:  Diagnosis Date  . Arthritis   . Asthma    As a child no problems now  . Hallux rigidus of right foot     Past Surgical History:  Procedure Laterality Date  . KNEE SURGERY    . ORIF ANKLE FRACTURE Right 11/08/2014   Procedure: OPEN REDUCTION INTERNAL FIXATION (ORIF) RIGHT ANKLE ;  Surgeon: Jene Every, MD;  Location: [facility];  Service: Orthopedics;  Laterality: Right;  . TOTAL KNEE ARTHROPLASTY Right 04/23/2016   Procedure: RIGHT TOTAL KNEE ARTHROPLASTY;  Surgeon: Jene Every, MD;  Location: [facility];  Service: Orthopedics;  Laterality: Right;    Family History  Problem Relation Age of Onset  . Dementia Mother   . Colon cancer Neg Hx    Social History:  reports that he has never smoked. He has never used smokeless tobacco. He reports current alcohol use. He reports that he does not use drugs.  Allergies: No Known Allergies  No medications prior to admission.    No results found for this or any previous visit (from the past 48 hour(s)). No results found.  ROS no recent fever, chills, nausea, vomiting or changes in his appetite  Blood pressure 111/82, pulse 77, temperature 98.1 F (36.7 C), resp. rate 15, height 6\' 3"  (1.905 m), weight 104.6 kg, SpO2 98 %. Physical Exam  Well-nourished well-developed man in no apparent distress.  Alert and oriented x4.  Mood and affect are normal.  Extraocular motions are intact.  Respirations are unlabored.  Gait is normal.  He has  some collapse of the longitudinal arch on the right.  Healed surgical incision over the medial malleolus.  4-5 strength in plantar flexion and inversion.  Tender to palpation along the posterior tibial tendon.  Heel cord is short.  Assessment/Plan Right posterior tibial tendon dysfunction, gastrocnemius contracture (short Achilles) and retained hardware at the medial malleolus -to the operating room today for gastrocnemius recession, posterior tibial tendon tenolysis, removal of hardware from the medial malleolus, FDL transfer to the navicular and medializing calcaneal osteotomy.  The risks and benefits of the alternative treatment options have been discussed in detail.  The patient wishes to proceed with surgery and specifically understands risks of bleeding, infection, nerve damage, blood clots, need for additional surgery, amputation and death.   Toni Arthurs, MD 05/16/18, 10:23 AM

## 2018-05-04 NOTE — Discharge Instructions (Addendum)
Vincent Hewitt, MD °[facility] ° °Please read the following information regarding your care after surgery. ° °Medications  °You only need a prescription for the narcotic pain medicine (ex. oxycodone, Percocet, Norco).  All of the other medicines listed below are available over the counter. °X Aleve 2 pills twice a day for the first 3 days after surgery. °X acetominophen (Tylenol) 650 mg every 4-6 hours as you need for minor to moderate pain °X oxycodone as prescribed for severe pain ° °Narcotic pain medicine (ex. oxycodone, Percocet, Vicodin) will cause constipation.  To prevent this problem, take the following medicines while you are taking any pain medicine. °X docusate sodium (Colace) 100 mg twice a day X senna (Senokot) 2 tablets twice a day ° °X To help prevent blood clots, take a baby aspirin (81 mg) twice a day after surgery.  You should also get up every hour while you are awake to move around.   ° °Weight Bearing °X Do not bear any weight on the operated leg or foot. ° °Cast / Splint / Dressing °X Keep your splint, cast or dressing clean and dry.  Don’t put anything (coat hanger, pencil, etc) down inside of it.  If it gets damp, use a hair dryer on the cool setting to dry it.  If it gets soaked, call the office to schedule an appointment for a cast change. ° °After your dressing, cast or splint is removed; you may shower, but do not soak or scrub the wound.  Allow the water to run over it, and then gently pat it dry. ° °Swelling °It is normal for you to have swelling where you had surgery.  To reduce swelling and pain, keep your toes above your nose for at least 3 days after surgery.  It may be necessary to keep your foot or leg elevated for several weeks.  If it hurts, it should be elevated. ° °Follow Up °Call my office at [phone] when you are discharged from the hospital or surgery center to schedule an appointment to be seen two weeks after surgery. ° °Call my office at [phone] if you  develop a fever >101.5° F, nausea, vomiting, bleeding from the surgical site or severe pain.   ° ° ° °Post Anesthesia Home Care Instructions ° °Activity: °Get plenty of rest for the remainder of the day. A responsible individual must stay with you for 24 hours following the procedure.  °For the next 24 hours, DO NOT: °-Drive a car °-Operate machinery °-Drink alcoholic beverages °-Take any medication unless instructed by your physician °-Make any legal decisions or sign important papers. ° °Meals: °Start with liquid foods such as gelatin or soup. Progress to regular foods as tolerated. Avoid greasy, spicy, heavy foods. If nausea and/or vomiting occur, drink only clear liquids until the nausea and/or vomiting subsides. Call your physician if vomiting continues. ° °Special Instructions/Symptoms: °Your throat may feel dry or sore from the anesthesia or the breathing tube placed in your throat during surgery. If this causes discomfort, gargle with warm salt water. The discomfort should disappear within 24 hours. ° °If you had a scopolamine patch placed behind your ear for the management of post- operative nausea and/or vomiting: ° °1. The medication in the patch is effective for 72 hours, after which it should be removed.  Wrap patch in a tissue and discard in the trash. Wash hands thoroughly with soap and water. °2. You may remove the patch earlier than 72 hours if you experience unpleasant side   effects which may include dry mouth, dizziness or visual disturbances. °3. Avoid touching the patch. Wash your hands with soap and water after contact with the patch. °  ° ° °Regional Anesthesia Blocks ° °1. Numbness or the inability to move the "blocked" extremity may last from 3-48 hours after placement. The length of time depends on the medication injected and your individual response to the medication. If the numbness is not going away after 48 hours, call your surgeon. ° °2. The extremity that is blocked will need to be  protected until the numbness is gone and the  Strength has returned. Because you cannot feel it, you will need to take extra care to avoid injury. Because it may be weak, you may have difficulty moving it or using it. You may not know what position it is in without looking at it while the block is in effect. ° °3. For blocks in the legs and feet, returning to weight bearing and walking needs to be done carefully. You will need to wait until the numbness is entirely gone and the strength has returned. You should be able to move your leg and foot normally before you try and bear weight or walk. You will need someone to be with you when you first try to ensure you do not fall and possibly risk injury. ° °4. Bruising and tenderness at the needle site are common side effects and will resolve in a few days. ° °5. Persistent numbness or new problems with movement should be communicated to the surgeon or the [facility] ([phone])/ [facility] ([phone]). °

## 2018-05-04 NOTE — Progress Notes (Signed)
Assisted Dr. Edmond Fitzgerald with right, ultrasound guided, popliteal/saphenous block. Side rails up, monitors on throughout procedure. See vital signs in flow sheet. Tolerated Procedure well.  

## 2018-05-04 NOTE — Anesthesia Postprocedure Evaluation (Signed)
Anesthesia Post Note  Patient: [name]  Procedure(s) Performed: RIGHT GASTROC RECESSION (Right Leg Lower) MEDIAL CALCANEAL OSTEOTOMY (Right Foot) MEDIAL MALLEOLUS HARDWARE REMOVAL (Right Ankle) RIGHT POSTERIOR TIBIALIS TENDON TENOLYSIS; FLEXOR DIGITORUM LONGUS TRANSFER TO NAVICULAR (Right Ankle)     Patient location during evaluation: PACU Anesthesia Type: General and Regional Level of consciousness: awake and alert Pain management: pain level controlled Vital Signs Assessment: post-procedure vital signs reviewed and stable Respiratory status: spontaneous breathing, nonlabored ventilation and respiratory function stable Cardiovascular status: blood pressure returned to baseline and stable Postop Assessment: no apparent nausea or vomiting Anesthetic complications: no    Last Vitals:  Vitals:   05/04/18 1315 05/04/18 1341  BP: 125/89 (!) 130/92  Pulse: 75 78  Resp: 15 18  Temp:  36.4 C  SpO2: 99% 100%    Last Pain:  Vitals:   05/04/18 1341  PainSc: 0-No pain                 Spirit Wernli,W. EDMOND

## 2018-05-04 NOTE — Anesthesia Procedure Notes (Signed)
Anesthesia Regional Block: Popliteal block   Pre-Anesthetic Checklist: ,, timeout performed, Correct Patient, Correct Site, Correct Laterality, Correct Procedure, Correct Position, site marked, Risks and benefits discussed, pre-op evaluation,  At surgeon's request and post-op pain management  Laterality: Right  Prep: Maximum Sterile Barrier Precautions used, chloraprep       Needles:  Injection technique: Single-shot  Needle Type: Echogenic Stimulator Needle     Needle Length: 9cm  Needle Gauge: 21     Additional Needles:   Procedures:, nerve stimulator,,, ultrasound used (permanent image in chart),,,,   Nerve Stimulator or Paresthesia:  Response: Peroneal,  Response: Tibial,   Additional Responses:   Narrative:  Start time: 05/04/2018 9:25 AM End time: 05/04/2018 9:35 AM Injection made incrementally with aspirations every 5 mL. Anesthesiologist: Gaynelle Adu, MD  Additional Notes: 2% Lidocaine skin wheel. Saphenous block with 10cc of 0.5% Bupivicaine plain. Sterile prep. Incremental injection after negative aspiration for heme.

## 2018-05-04 NOTE — Transfer of Care (Signed)
Immediate Anesthesia Transfer of Care Note  Patient: [name]  Procedure(s) Performed: RIGHT GASTROC RECESSION (Right Leg Lower) MEDIAL CALCANEAL OSTEOTOMY (Right Foot) MEDIAL MALLEOLUS HARDWARE REMOVAL (Right Ankle) RIGHT POSTERIOR TIBIALIS TENDON TENOLYSIS; FLEXOR DIGITORUM LONGUS TRANSFER TO NAVICULAR (Right Ankle)  Patient Location: PACU  Anesthesia Type:GA combined with regional for post-op pain  Level of Consciousness: drowsy and patient cooperative  Airway & Oxygen Therapy: Patient Spontanous Breathing and Patient connected to face mask oxygen  Post-op Assessment: Report given to RN and Post -op Vital signs reviewed and stable  Post vital signs: Reviewed and stable  Last Vitals:  Vitals Value Taken Time  BP 106/74 05/04/2018 12:42 PM  Temp    Pulse 79 05/04/2018 12:43 PM  Resp 16 05/04/2018 12:43 PM  SpO2 100 % 05/04/2018 12:43 PM  Vitals shown include unvalidated device data.  Last Pain:  Vitals:   05/04/18 0835  PainSc: 2       Patients Stated Pain Goal: 5 (05/04/18 0835)  Complications: No apparent anesthesia complications

## 2018-05-04 NOTE — Anesthesia Procedure Notes (Signed)
Procedure Name: LMA Insertion Date/Time: 05/04/2018 11:02 AM Performed by: Sheryn Bison, CRNA Pre-anesthesia Checklist: Patient identified, Emergency Drugs available, Suction available and Patient being monitored Patient Re-evaluated:Patient Re-evaluated prior to induction Oxygen Delivery Method: Circle system utilized Preoxygenation: Pre-oxygenation with 100% oxygen Induction Type: IV induction Ventilation: Mask ventilation without difficulty LMA: LMA inserted LMA Size: 4.0 Number of attempts: 1 Airway Equipment and Method: Bite block Placement Confirmation: positive ETCO2 Tube secured with: Tape Dental Injury: Teeth and Oropharynx as per pre-operative assessment

## 2018-05-05 ENCOUNTER — Encounter (HOSPITAL_BASED_OUTPATIENT_CLINIC_OR_DEPARTMENT_OTHER): Payer: Self-pay | Admitting: Orthopedic Surgery

## 2018-05-22 DIAGNOSIS — M67961 Unspecified disorder of synovium and tendon, right lower leg: Secondary | ICD-10-CM | POA: Diagnosis not present

## 2018-06-22 DIAGNOSIS — M67961 Unspecified disorder of synovium and tendon, right lower leg: Secondary | ICD-10-CM | POA: Diagnosis not present

## 2018-07-21 DIAGNOSIS — M79671 Pain in right foot: Secondary | ICD-10-CM | POA: Diagnosis not present

## 2018-07-21 DIAGNOSIS — M1712 Unilateral primary osteoarthritis, left knee: Secondary | ICD-10-CM | POA: Diagnosis not present

## 2018-07-21 DIAGNOSIS — M25562 Pain in left knee: Secondary | ICD-10-CM | POA: Diagnosis not present

## 2018-07-27 DIAGNOSIS — M25571 Pain in right ankle and joints of right foot: Secondary | ICD-10-CM | POA: Diagnosis not present

## 2018-12-22 IMAGING — DX DG ABDOMEN 2V
3 series · 3 of 3 positions shown · non-contrast
Comparison: None.

CLINICAL DATA: Lower abdominal pain x4 days, constipation

EXAM:
ABDOMEN - 2 VIEW

[abdomen erect]
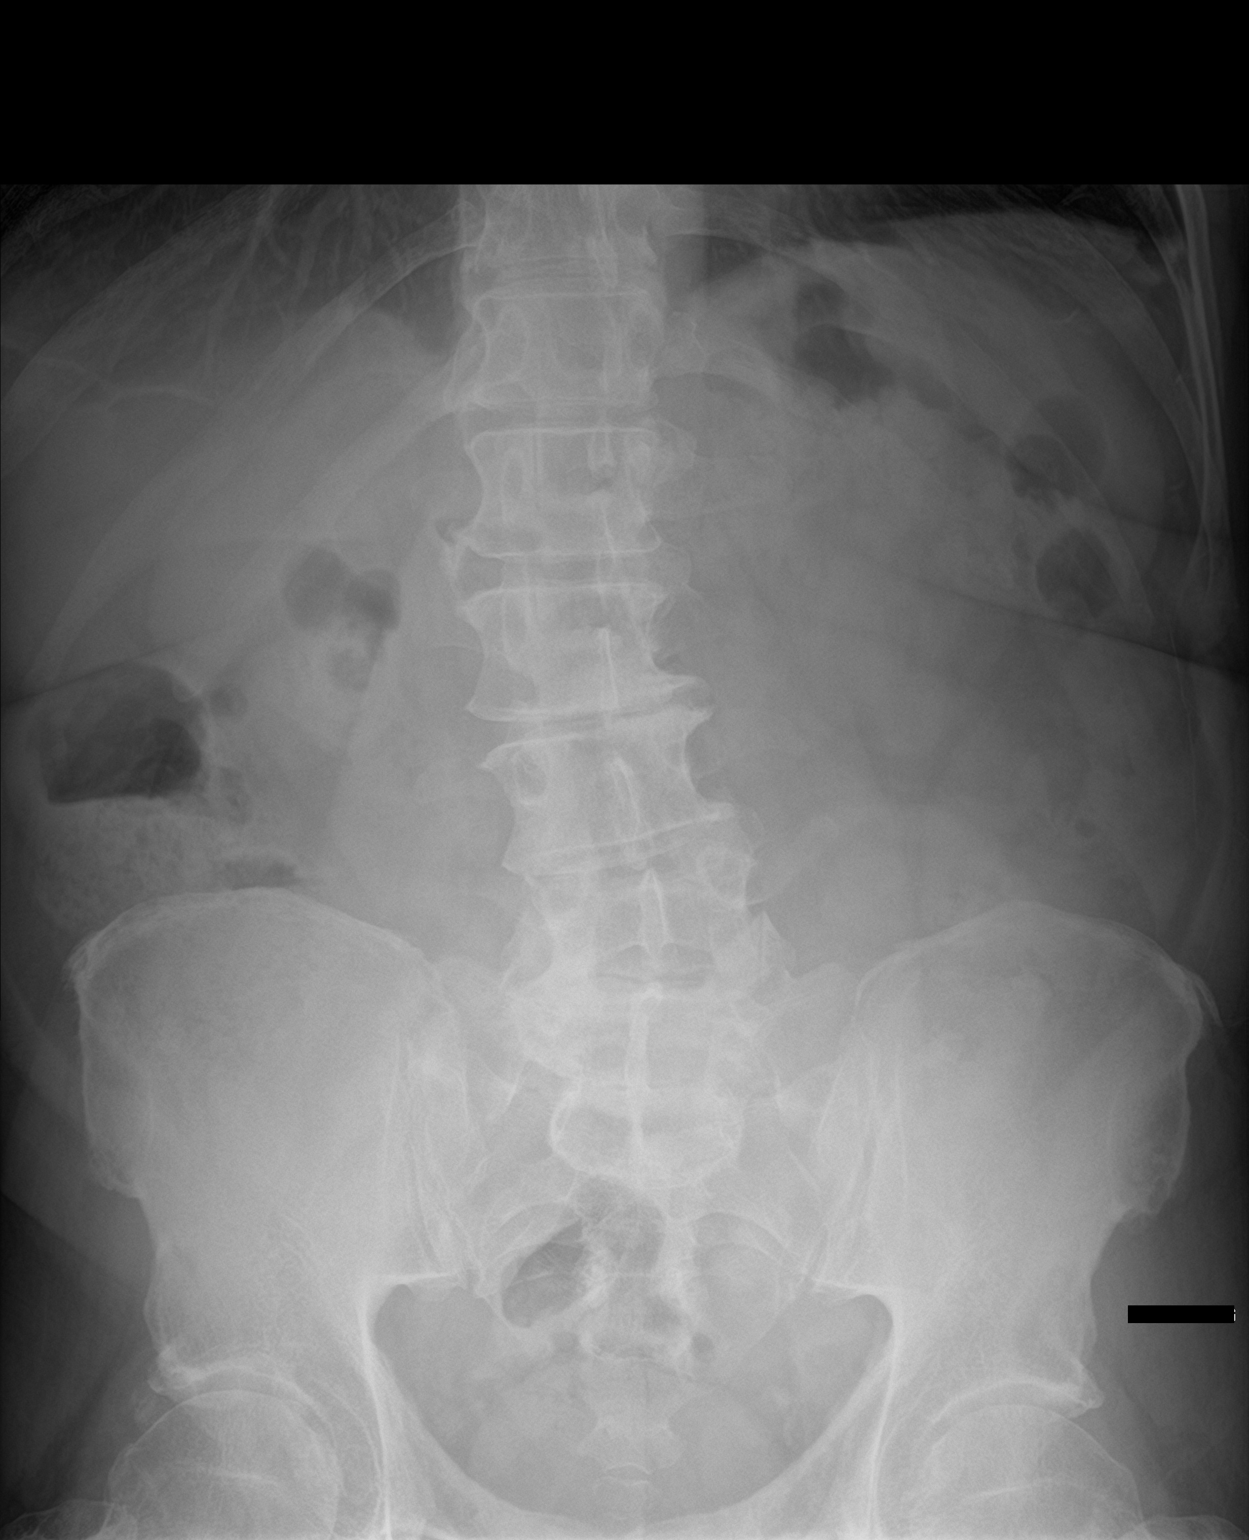

[abdomen supine (1 of 2)]
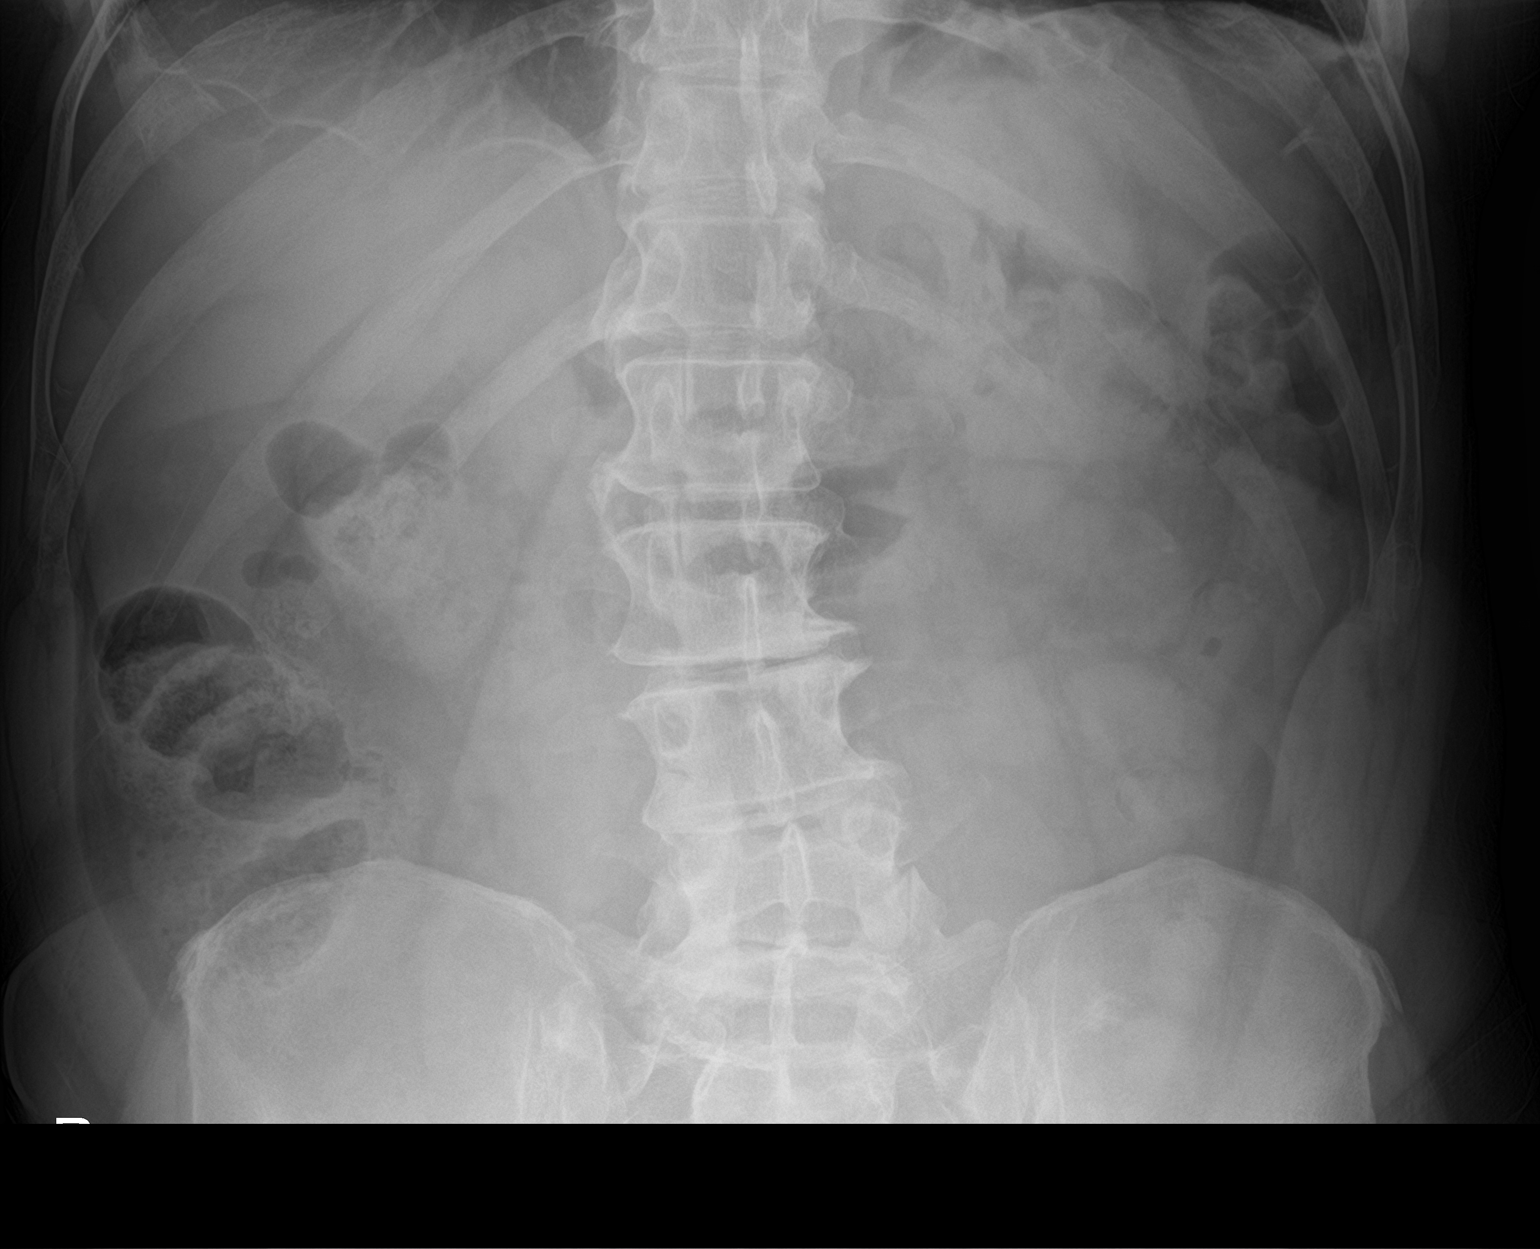

[abdomen supine (2 of 2)]
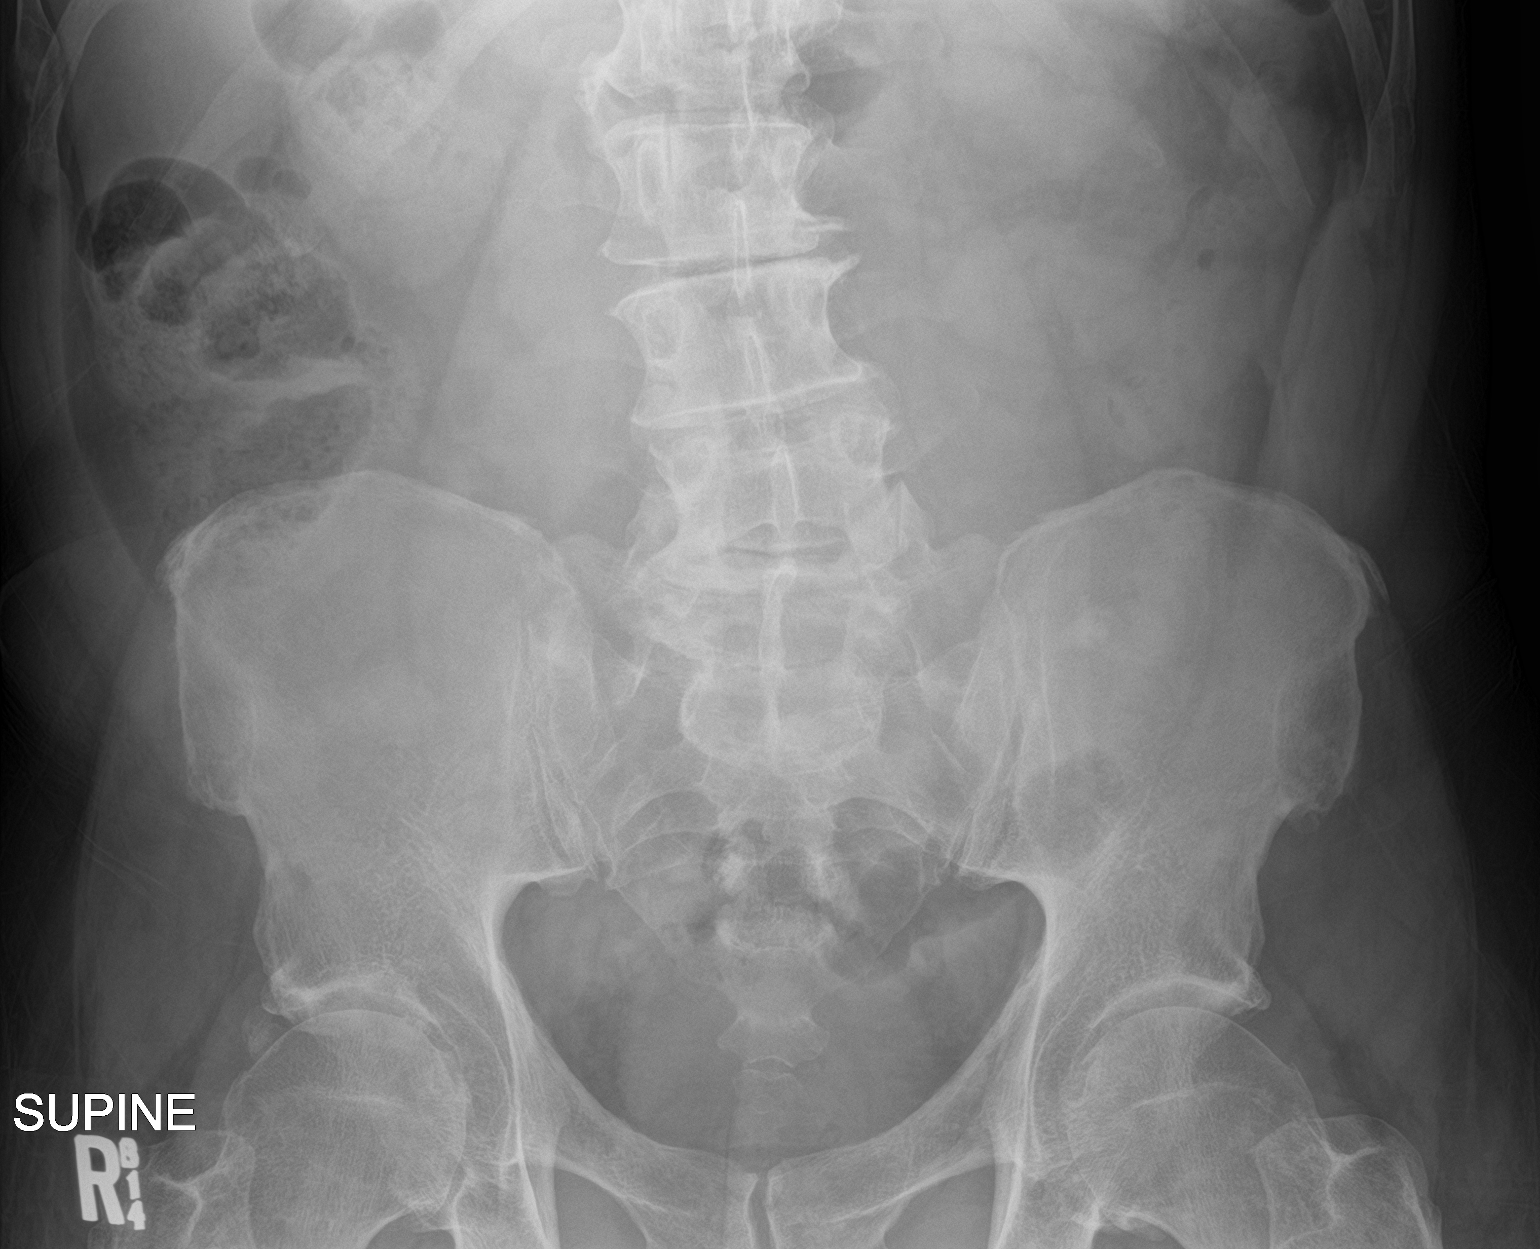

[3 of 3 positions shown; findings below may reference images not displayed]

FINDINGS: Nonobstructive bowel gas pattern.

No evidence of free air under the diaphragm on the upright view.

Degenerative changes of the lumbar spine with mild dextroscoliosis.
IMPRESSION: No evidence of small bowel obstruction or free air.

## 2019-01-30 DIAGNOSIS — M25562 Pain in left knee: Secondary | ICD-10-CM | POA: Diagnosis not present

## 2021-01-23 DIAGNOSIS — M1712 Unilateral primary osteoarthritis, left knee: Secondary | ICD-10-CM | POA: Diagnosis not present

## 2021-03-05 DIAGNOSIS — M1712 Unilateral primary osteoarthritis, left knee: Secondary | ICD-10-CM | POA: Diagnosis not present

## 2021-03-05 DIAGNOSIS — M25562 Pain in left knee: Secondary | ICD-10-CM | POA: Diagnosis not present

## 2021-03-06 DIAGNOSIS — M1712 Unilateral primary osteoarthritis, left knee: Secondary | ICD-10-CM | POA: Diagnosis not present

## 2021-03-26 ENCOUNTER — Ambulatory Visit: Payer: Self-pay | Admitting: Orthopedic Surgery

## 2021-03-26 DIAGNOSIS — M1712 Unilateral primary osteoarthritis, left knee: Secondary | ICD-10-CM

## 2021-04-20 NOTE — Progress Notes (Signed)
Anesthesia Review:  PCP: Cardiologist : Chest x-ray : EKG : Echo :2017  Stress test: Cardiac Cath :  Activity level:  Sleep Study/ CPAP : Fasting Blood Sugar :      / Checks Blood Sugar -- times a day:   Blood Thinner/ Instructions /Last Dose: ASA / Instructions/ Last Dose :

## 2021-04-20 NOTE — Progress Notes (Signed)
DUE TO COVID-19 ONLY ONE VISITOR IS ALLOWED TO COME WITH YOU AND STAY IN THE WAITING ROOM ONLY DURING PRE OP AND PROCEDURE DAY OF SURGERY.  2 VISITOR  MAY VISIT WITH YOU AFTER SURGERY IN YOUR PRIVATE ROOM DURING VISITING HOURS ONLY!  YOU NEED TO HAVE A COVID 19 TEST ON_12/09/2020 @_  @_from  8am-3pm _____, THIS TEST MUST BE DONE BEFORE SURGERY,  Covid test is done at [address] [place], [address].  This is a drive thru.  No appt required. Please see map.                 Your procedure is scheduled on:    04/29/2021   Report to [facility] Main  Entrance   Report to admitting at   [phone]     Call this number if you have problems the morning of surgery [phone]    REMEMBER: NO  SOLID FOOD CANDY OR GUM AFTER MIDNIGHT. CLEAR LIQUIDS UNTIL   0530am        . NOTHING BY MOUTH EXCEPT CLEAR LIQUIDS UNTIL  0530am  . PLEASE FINISH ENSURE DRINK PER SURGEON ORDER  WHICH NEEDS TO BE COMPLETED AT   0530am    .      CLEAR LIQUID DIET   Foods Allowed                                                                    Coffee and tea, regular and decaf                            Fruit ices (not with fruit pulp)                                      Iced Popsicles                                    Carbonated beverages, regular and diet                                    Cranberry, grape and apple juices Sports drinks like Gatorade Lightly seasoned clear broth or consume(fat free) Sugar, honey syrup ___________________________________________________________________      BRUSH YOUR TEETH MORNING OF SURGERY AND RINSE YOUR MOUTH OUT, NO CHEWING GUM CANDY OR MINTS.     Take these medicines the morning of surgery with A SIP OF WATER:  none   DO NOT TAKE ANY DIABETIC MEDICATIONS DAY OF YOUR SURGERY                               You may not have any metal on your body including hair pins and              piercings  Do not wear jewelry, make-up, lotions, powders or perfumes,  deodorant             Do not wear  nail polish on your fingernails.  Do not shave  48 hours prior to surgery.              Men may shave face and neck.   Do not bring valuables to the hospital. [place].  Contacts, dentures or bridgework may not be worn into surgery.  Leave suitcase in the car. After surgery it may be brought to your room.     Patients discharged the day of surgery will not be allowed to drive home. IF YOU ARE HAVING SURGERY AND GOING HOME THE SAME DAY, YOU MUST HAVE AN ADULT TO DRIVE YOU HOME AND BE WITH YOU FOR 24 HOURS. YOU MAY GO HOME BY TAXI OR UBER OR ORTHERWISE, BUT AN ADULT MUST ACCOMPANY YOU HOME AND STAY WITH YOU FOR 24 HOURS.  Name and phone number of your driver:  Special Instructions: N/A              Please read over the following fact sheets you were given: _____________________________________________________________________  [facility] - Preparing for Surgery Before surgery, you can play an important role.  Because skin is not sterile, your skin needs to be as free of germs as possible.  You can reduce the number of germs on your skin by washing with CHG (chlorahexidine gluconate) soap before surgery.  CHG is an antiseptic cleaner which kills germs and bonds with the skin to continue killing germs even after washing. Please DO NOT use if you have an allergy to CHG or antibacterial soaps.  If your skin becomes reddened/irritated stop using the CHG and inform your nurse when you arrive at Short Stay. Do not shave (including legs and underarms) for at least 48 hours prior to the first CHG shower.  You may shave your face/neck. Please follow these instructions carefully:  1.  Shower with CHG Soap the night before surgery and the  morning of Surgery.  2.  If you choose to wash your hair, wash your hair first as usual with your  normal  shampoo.  3.  After you shampoo, rinse your hair and body thoroughly to remove  the  shampoo.                           4.  Use CHG as you would any other liquid soap.  You can apply chg directly  to the skin and wash                       Gently with a scrungie or clean washcloth.  5.  Apply the CHG Soap to your body ONLY FROM THE NECK DOWN.   Do not use on face/ open                           Wound or open sores. Avoid contact with eyes, ears mouth and genitals (private parts).                       Wash face,  Genitals (private parts) with your normal soap.             6.  Wash thoroughly, paying special attention to the area where your surgery  will be performed.  7.  Thoroughly rinse your body with warm water from  the neck down.  8.  DO NOT shower/wash with your normal soap after using and rinsing off  the CHG Soap.                9.  Pat yourself dry with a clean towel.            10.  Wear clean pajamas.            11.  Place clean sheets on your bed the night of your first shower and do not  sleep with pets. Day of Surgery : Do not apply any lotions/deodorants the morning of surgery.  Please wear clean clothes to the hospital/surgery center.  FAILURE TO FOLLOW THESE INSTRUCTIONS MAY RESULT IN THE CANCELLATION OF YOUR SURGERY PATIENT SIGNATURE_________________________________  NURSE SIGNATURE__________________________________  ________________________________________________________________________

## 2021-04-22 ENCOUNTER — Encounter (HOSPITAL_COMMUNITY): Payer: Self-pay

## 2021-04-22 ENCOUNTER — Encounter (HOSPITAL_COMMUNITY)
Admission: RE | Admit: 2021-04-22 | Discharge: 2021-04-22 | Disposition: A | Discharge status: Home / Self Care | Payer: BLUE CROSS/BLUE SHIELD | Source: Ambulatory Visit | Attending: Internal Medicine | Admitting: Internal Medicine

## 2021-04-22 DIAGNOSIS — M1712 Unilateral primary osteoarthritis, left knee: Secondary | ICD-10-CM | POA: Diagnosis not present

## 2021-04-29 ENCOUNTER — Ambulatory Visit (HOSPITAL_COMMUNITY): Admission: RE | Admit: 2021-04-29 | Payer: BC Managed Care – PPO | Source: Ambulatory Visit | Admitting: Specialist

## 2021-04-29 ENCOUNTER — Encounter (HOSPITAL_COMMUNITY): Admission: RE | Payer: Self-pay | Source: Ambulatory Visit

## 2021-04-29 SURGERY — ARTHROPLASTY, KNEE, TOTAL
Anesthesia: Spinal | Site: Knee | Laterality: Left

## 2021-12-11 ENCOUNTER — Other Ambulatory Visit: Payer: Self-pay

## 2021-12-11 ENCOUNTER — Emergency Department (HOSPITAL_COMMUNITY)
Admission: EM | Admit: 2021-12-11 | Discharge: 2021-12-11 | Disposition: A | Discharge status: Home / Self Care | Payer: 59 | Attending: Emergency Medicine | Admitting: Emergency Medicine

## 2021-12-11 ENCOUNTER — Encounter (HOSPITAL_COMMUNITY): Payer: Self-pay

## 2021-12-11 DIAGNOSIS — X58XXXA Exposure to other specified factors, initial encounter: Secondary | ICD-10-CM | POA: Insufficient documentation

## 2021-12-11 DIAGNOSIS — Y92812 Truck as the place of occurrence of the external cause: Secondary | ICD-10-CM | POA: Diagnosis not present

## 2021-12-11 DIAGNOSIS — S39012A Strain of muscle, fascia and tendon of lower back, initial encounter: Secondary | ICD-10-CM | POA: Insufficient documentation

## 2021-12-11 DIAGNOSIS — Z7982 Long term (current) use of aspirin: Secondary | ICD-10-CM | POA: Diagnosis not present

## 2021-12-11 DIAGNOSIS — S3992XA Unspecified injury of lower back, initial encounter: Secondary | ICD-10-CM | POA: Diagnosis present

## 2021-12-11 DIAGNOSIS — J45909 Unspecified asthma, uncomplicated: Secondary | ICD-10-CM | POA: Insufficient documentation

## 2021-12-11 MED ORDER — KETOROLAC TROMETHAMINE 15 MG/ML IJ SOLN
15.0000 mg | Freq: Once | INTRAMUSCULAR | Status: AC
Start: 2021-12-11 — End: 2021-12-11
  Administered 2021-12-11: 15 mg via INTRAVENOUS
  Filled 2021-12-11: qty 1

## 2021-12-11 MED ORDER — DIAZEPAM 2 MG PO TABS
2.0000 mg | ORAL_TABLET | Freq: Once | ORAL | Status: AC
  Administered 2021-12-11: 2 mg via ORAL
  Filled 2021-12-11: qty 1

## 2021-12-11 MED ORDER — OXYCODONE HCL 5 MG PO TABS
5.0000 mg | ORAL_TABLET | Freq: Once | ORAL | Status: AC
  Administered 2021-12-11: 5 mg via ORAL
  Filled 2021-12-11: qty 1

## 2021-12-11 MED ORDER — LIDOCAINE 5 % EX PTCH
1.0000 | MEDICATED_PATCH | CUTANEOUS | Status: DC
  Administered 2021-12-11: 1 via TRANSDERMAL
  Filled 2021-12-11: qty 1

## 2021-12-11 MED ORDER — ACETAMINOPHEN 500 MG PO TABS
1000.0000 mg | ORAL_TABLET | Freq: Once | ORAL | Status: AC
  Administered 2021-12-11: 1000 mg via ORAL
  Filled 2021-12-11: qty 2

## 2021-12-11 NOTE — Discharge Instructions (Signed)
Please continue taking the medication that you are prescribed for your muscle spasms.  If needed you will need to schedule an appointment with orthopedics to be further evaluated.

## 2021-12-11 NOTE — ED Triage Notes (Signed)
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  EMS gave Fentanyl 100 mcg Iv prior to arrival to the ED.

## 2021-12-11 NOTE — ED Provider Triage Note (Addendum)
Emergency Medicine Provider Triage Evaluation Note  Vincent Ray , a 66 y.o. male  was evaluated in triage.  Pt complains of muscle spasm low back. Went to [employer], given [employer] without improvement. Ongoing right lower back pain today, got to work and pain got worse.  Ongoing low back pain for a long time, was doing yard work bending down when symptoms flared up this time. Called his ortho back and they have sent in Robaxin today- has not started this medicaiton.  Review of Systems  Positive: Back pain Negative: Abdominal pain, groin numbness, loss of bowel or bladder control.   Physical Exam  BP (!) 149/91 (BP Location: Right Arm)   Pulse 82   Temp 98.4 F (36.9 C) (Oral)   Resp 18   SpO2 96%  Gen:   Awake, no distress   Resp:  Normal effort  MSK:   Moves extremities without difficulty  Other:    Medical Decision Making  Medically screening exam initiated at 4:39 PM.  Appropriate orders placed.  Vincent Ray was informed that the remainder of the evaluation will be completed by another provider, this initial triage assessment does not replace that evaluation, and the importance of remaining in the ED until their evaluation is complete.     Jeannie Fend, PA-C 12/11/21 1639    Jeannie Fend, PA-C 12/11/21 1641

## 2021-12-11 NOTE — ED Provider Notes (Signed)
[facility]-EMERGENCY DEPT Provider Note   CSN: [id number] Arrival date & time: 12/11/21  1604     History Chief Complaint  Patient presents with   Back Pain    Vincent Ray is a 66 y.o. male.  67 year old male with a past medical history of Asthma presents to the ED with a chief complaint of right-sided back pain which has been ongoing for the past couple of days.  Pain was exacerbated couple of days ago, patient reports constant spasming, pulling sensation to his right lumbar spine.  [facility]uated [facility] yesterday giving a shot of cortisone, along with sent home with a prescription for teens Inadine.  He reports taking this medication, went to work today however felt that the pain was returning.  He reports trying to ambulate and felt that his back locked up therefore he could not go anywhere.  He called EMS who provided him with some fentanyl to help with pain control.  Patient has also tried ice, heat to the area without much improvement in symptoms.  Denies any fever, no prior history of IV drug use, no bowel or bladder complaints, no prior history of cancer.  The history is provided by the patient and medical records.  Back Pain Location:  Lumbar spine Quality:  Aching Radiates to:  Does not radiate Pain severity:  Severe Pain is:  Worse during the day Onset quality:  Sudden Duration:  2 days Timing:  Intermittent Progression:  Unchanged Chronicity:  New Associated symptoms: no chest pain and no fever        Home Medications Prior to Admission medications   Medication Sig Start Date End Date Taking? Authorizing Provider  aspirin EC 81 MG tablet Take 1 tablet (81 mg total) by mouth 2 (two) times daily. Patient not taking: Reported on 04/15/2021 05/04/18   Jacinta Shoe, PA-C  docusate sodium (COLACE) 100 MG capsule Take 1 capsule (100 mg total) by mouth 2 (two) times daily. While taking narcotic pain medicine. Patient not  taking: Reported on 04/15/2021 05/04/18   Jacinta Shoe, PA-C  ibuprofen (ADVIL) 200 MG tablet Take 400 mg by mouth every 6 (six) hours as needed for moderate pain.    [provider]  senna (SENOKOT) 8.6 MG TABS tablet Take 2 tablets (17.2 mg total) by mouth 2 (two) times daily. Patient not taking: Reported on 04/15/2021 05/04/18   Jacinta Shoe, PA-C      Allergies    Patient has no known allergies.    Review of Systems   Review of Systems  Constitutional:  Negative for fever.  Respiratory:  Negative for shortness of breath.   Cardiovascular:  Negative for chest pain.  Genitourinary:  Negative for difficulty urinating.  Musculoskeletal:  Positive for back pain.    Physical Exam Updated Vital Signs BP (!) 143/90   Pulse 69   Temp 98.4 F (36.9 C) (Oral)   Resp 18   Ht 6\' 3"  (1.905 m)   Wt 103 kg   SpO2 92%   BMI 28.37 kg/m  Physical Exam Vitals and nursing note reviewed.  Constitutional:      Appearance: Normal appearance.  HENT:     Head: Normocephalic and atraumatic.  Eyes:     Pupils: Pupils are equal, round, and reactive to light.  Cardiovascular:     Rate and Rhythm: Normal rate.  Pulmonary:     Effort: Pulmonary effort is normal.  Abdominal:     General: Abdomen is  flat.  Musculoskeletal:     Cervical back: Normal range of motion and neck supple.     Lumbar back: Spasms and tenderness present.       Back:     Comments: RLE- KF,KE 5/5 strength LLE- HF, HE 5/5 strength Normal gait. No pronator drift. No leg drop.  CN I, II and VIII not tested. CN II-XII grossly intact bilaterally.      Skin:    General: Skin is warm and dry.  Neurological:     Mental Status: He is alert and oriented to person, place, and time.     ED Results / Procedures / Treatments   Labs (all labs ordered are listed, but only abnormal results are displayed) Labs Reviewed - No data to display  EKG None  Radiology No results  found.  Procedures Procedures    Medications Ordered in ED Medications  lidocaine (LIDODERM) 5 % 1 patch (1 patch Transdermal Patch Applied 12/11/21 1934)  ketorolac (TORADOL) 15 MG/ML injection 15 mg (15 mg Intravenous Given 12/11/21 1935)  acetaminophen (TYLENOL) tablet 1,000 mg (1,000 mg Oral Given 12/11/21 1936)  oxyCODONE (Oxy IR/ROXICODONE) immediate release tablet 5 mg (5 mg Oral Given 12/11/21 1936)  diazepam (VALIUM) tablet 2 mg (2 mg Oral Given 12/11/21 1935)    ED Course/ Medical Decision Making/ A&P                           Medical Decision Making Risk OTC drugs. Prescription drug management.   Patient here with a chief complaint of right lumbar spine pain which has been ongoing for the past 2 days exacerbated yesterday.  Evaluated at [facility], given a shot of cortisone, also sent home with a prescription for tizanidine.  Reports symptoms have not improved.  He went to work today, felt like his back was locking up and spasming and lowered himself to the ground he reports he was given fentanyl by EMS with some improvement in symptoms.  In the ED he has antalgic gait noted, there is good strength to bilateral lower extremities.  Pain reproducible with palpation along the right lumbar spine but no rashes, or bruising noted.  Vitals are within normal limits.  He did take his last pain medication approximately at noon, will give him medication for pain control.  7:52 PM patient reevaluated by me with some improvement in his spasms along with pain.  I did discuss with him following up with orthopedics on an outpatient basis.  He will return back to work on Monday.  He is discharged in stable condition with steady gait from the emergency department.   Portions of this note were generated with Scientist, clinical (histocompatibility and immunogenetics). Dictation errors may occur despite best attempts at proofreading.  Final Clinical Impression(s) / ED Diagnoses Final diagnoses:  Strain of lumbar region, initial  encounter    Rx / DC Orders ED Discharge Orders     None         Claude Manges, PA-C 12/11/21 1952    Virgina Norfolk, DO 12/11/21 2020

## 2022-01-14 ENCOUNTER — Ambulatory Visit (INDEPENDENT_AMBULATORY_CARE_PROVIDER_SITE_OTHER): Payer: 59 | Admitting: Family Medicine

## 2022-01-14 ENCOUNTER — Encounter: Payer: Self-pay | Admitting: Family Medicine

## 2022-01-14 VITALS — BP 133/84 | HR 85 | Temp 98.3°F | Resp 18 | Ht 75.0 in | Wt 230.0 lb

## 2022-01-14 DIAGNOSIS — Z125 Encounter for screening for malignant neoplasm of prostate: Secondary | ICD-10-CM

## 2022-01-14 DIAGNOSIS — Z0000 Encounter for general adult medical examination without abnormal findings: Secondary | ICD-10-CM

## 2022-01-14 DIAGNOSIS — Z130 Encounter for screening for diseases of the blood and blood-forming organs and certain disorders involving the immune mechanism: Secondary | ICD-10-CM

## 2022-01-14 DIAGNOSIS — Z13220 Encounter for screening for lipoid disorders: Secondary | ICD-10-CM

## 2022-01-14 DIAGNOSIS — Z114 Encounter for screening for human immunodeficiency virus [HIV]: Secondary | ICD-10-CM

## 2022-01-14 NOTE — Progress Notes (Signed)
..  Pt presents to establish care,  -request preventative exam,

## 2022-01-15 ENCOUNTER — Encounter: Payer: Self-pay | Admitting: Family Medicine

## 2022-01-15 ENCOUNTER — Other Ambulatory Visit: Payer: Self-pay | Admitting: Family Medicine

## 2022-01-15 DIAGNOSIS — R972 Elevated prostate specific antigen [PSA]: Secondary | ICD-10-CM

## 2022-01-15 LAB — VITAMIN D 25 HYDROXY (VIT D DEFICIENCY, FRACTURES): Vit D, 25-Hydroxy: 33.8 ng/mL (ref 30.0–100.0)

## 2022-01-15 LAB — TSH: TSH: 2.01 u[IU]/mL (ref 0.450–4.500)

## 2022-01-15 LAB — LIPID PANEL
Chol/HDL Ratio: 4.2 ratio (ref 0.0–5.0)
Cholesterol, Total: 158 mg/dL (ref 100–199)
HDL: 38 mg/dL — ABNORMAL LOW (ref >39)
LDL Chol Calc (NIH): 94 mg/dL (ref 0–99)
Triglycerides: 147 mg/dL (ref 0–149)
VLDL Cholesterol Cal: 26 mg/dL (ref 5–40)

## 2022-01-15 LAB — CBC WITH DIFFERENTIAL/PLATELET
Basophils Absolute: 0.1 10*3/uL (ref 0.0–0.2)
Basos: 1 %
EOS (ABSOLUTE): 0.3 10*3/uL (ref 0.0–0.4)
Eos: 5 %
Hematocrit: 44.8 % (ref 37.5–51.0)
Hemoglobin: 15.2 g/dL (ref 13.0–17.7)
Immature Grans (Abs): 0 10*3/uL (ref 0.0–0.1)
Immature Granulocytes: 0 %
Lymphocytes Absolute: 2.5 10*3/uL (ref 0.7–3.1)
Lymphs: 48 %
MCH: 30.4 pg (ref 26.6–33.0)
MCHC: 33.9 g/dL (ref 31.5–35.7)
MCV: 90 fL (ref 79–97)
Monocytes Absolute: 0.4 10*3/uL (ref 0.1–0.9)
Monocytes: 8 %
Neutrophils Absolute: 2 10*3/uL (ref 1.4–7.0)
Neutrophils: 38 %
Platelets: 232 10*3/uL (ref 150–450)
RBC: 5 x10E6/uL (ref 4.14–5.80)
RDW: 13.9 % (ref 11.6–15.4)
WBC: 5.3 10*3/uL (ref 3.4–10.8)

## 2022-01-15 LAB — CMP14+EGFR
ALT: 27 IU/L (ref 0–44)
AST: 25 IU/L (ref 0–40)
Albumin/Globulin Ratio: 1.4 (ref 1.2–2.2)
Albumin: 4.2 g/dL (ref 3.9–4.9)
Alkaline Phosphatase: 81 IU/L (ref 44–121)
BUN/Creatinine Ratio: 14 (ref 10–24)
BUN: 16 mg/dL (ref 8–27)
Bilirubin Total: 0.4 mg/dL (ref 0.0–1.2)
CO2: 24 mmol/L (ref 20–29)
Calcium: 9.3 mg/dL (ref 8.6–10.2)
Chloride: 105 mmol/L (ref 96–106)
Creatinine, Ser: 1.16 mg/dL (ref 0.76–1.27)
Globulin, Total: 2.9 g/dL (ref 1.5–4.5)
Glucose: 90 mg/dL (ref 70–99)
Potassium: 4.2 mmol/L (ref 3.5–5.2)
Sodium: 143 mmol/L (ref 134–144)
Total Protein: 7.1 g/dL (ref 6.0–8.5)
eGFR: 71 mL/min/{1.73_m2} (ref >59)

## 2022-01-15 LAB — PSA: Prostate Specific Ag, Serum: 5.4 ng/mL — ABNORMAL HIGH (ref 0.0–4.0)

## 2022-01-15 LAB — HIV ANTIBODY (ROUTINE TESTING W REFLEX): HIV Screen 4th Generation wRfx: NONREACTIVE

## 2022-01-15 NOTE — Progress Notes (Signed)
New Patient Office Visit  Subjective    Patient ID: Vincent Ray, male    DOB: November 28, 1955  Age: 66 y.o. MRN: [id number]  CC:  Chief Complaint  Patient presents with   Establish Care    HPI Vincent Ray presents to establish care and for routine annual exam. Patient denies acute complaints or concerns.    Outpatient Encounter Medications as of 01/14/2022  Medication Sig   aspirin EC 81 MG tablet Take 1 tablet (81 mg total) by mouth 2 (two) times daily. (Patient not taking: Reported on 04/15/2021)   docusate sodium (COLACE) 100 MG capsule Take 1 capsule (100 mg total) by mouth 2 (two) times daily. While taking narcotic pain medicine. (Patient not taking: Reported on 04/15/2021)   ibuprofen (ADVIL) 200 MG tablet Take 400 mg by mouth every 6 (six) hours as needed for moderate pain.   senna (SENOKOT) 8.6 MG TABS tablet Take 2 tablets (17.2 mg total) by mouth 2 (two) times daily. (Patient not taking: Reported on 04/15/2021)   No facility-administered encounter medications on file as of 01/14/2022.    Past Medical History:  Diagnosis Date   Arthritis    Asthma    As a child no problems now   Hallux rigidus of right foot     Past Surgical History:  Procedure Laterality Date   CALCANEAL OSTEOTOMY Right 05/04/2018   Procedure: MEDIAL CALCANEAL OSTEOTOMY;  Surgeon: Wylene Simmer, MD;  Location: [place];  Service: Orthopedics;  Laterality: Right;   GASTROC RECESSION EXTREMITY Right 05/04/2018   Procedure: RIGHT GASTROC RECESSION;  Surgeon: Wylene Simmer, MD;  Location: [place];  Service: Orthopedics;  Laterality: Right;   HARDWARE REMOVAL Right 05/04/2018   Procedure: MEDIAL MALLEOLUS HARDWARE REMOVAL;  Surgeon: Wylene Simmer, MD;  Location: [place];  Service: Orthopedics;  Laterality: Right;   KNEE SURGERY     ORIF ANKLE FRACTURE Right 11/08/2014   Procedure: OPEN REDUCTION INTERNAL FIXATION (ORIF) RIGHT ANKLE ;   Surgeon: Susa Day, MD;  Location: [facility];  Service: Orthopedics;  Laterality: Right;   TOTAL KNEE ARTHROPLASTY Right 04/23/2016   Procedure: RIGHT TOTAL KNEE ARTHROPLASTY;  Surgeon: Susa Day, MD;  Location: [facility];  Service: Orthopedics;  Laterality: Right;    Family History  Problem Relation Age of Onset   Dementia Mother    Colon cancer Neg Hx     Social History   Socioeconomic History   Marital status: Married    Spouse name: Not on file   Number of children: Not on file   Years of education: Not on file   Highest education level: Not on file  Occupational History   Not on file  Tobacco Use   Smoking status: Never    Passive exposure: Never   Smokeless tobacco: Never  Vaping Use   Vaping Use: Never used  Substance and Sexual Activity   Alcohol use: Yes    Comment: occasionally    Drug use: No   Sexual activity: Not on file  Other Topics Concern   Not on file  Social History Narrative   Not on file   Social Determinants of Health   Financial Resource Strain: Not on file  Food Insecurity: Not on file  Transportation Needs: Not on file  Physical Activity: Not on file  Stress: Not on file  Social Connections: Not on file  Intimate Partner Violence: Not on file    Review of Systems  All other systems reviewed and  are negative.       Objective    BP 133/84 (BP Location: Left Arm, Patient Position: Sitting, Cuff Size: Large)   Pulse 85   Temp 98.3 F (36.8 C)   Resp 18   Ht 6' 3"  (1.905 m)   Wt 230 lb (104.3 kg)   SpO2 96%   BMI 28.75 kg/m   Physical Exam Vitals and nursing note reviewed.  Constitutional:      General: He is not in acute distress. HENT:     Head: Normocephalic and atraumatic.     Right Ear: Tympanic membrane, ear canal and external ear normal.     Left Ear: Tympanic membrane, ear canal and external ear normal.     Nose: Nose normal.     Mouth/Throat:     Mouth: Mucous membranes are moist.     Pharynx: Oropharynx is  clear.  Eyes:     Conjunctiva/sclera: Conjunctivae normal.     Pupils: Pupils are equal, round, and reactive to light.  Neck:     Thyroid: No thyromegaly.  Cardiovascular:     Rate and Rhythm: Normal rate and regular rhythm.     Heart sounds: Normal heart sounds. No murmur heard. Pulmonary:     Effort: Pulmonary effort is normal.     Breath sounds: Normal breath sounds.  Abdominal:     General: There is no distension.     Palpations: Abdomen is soft. There is no mass.     Tenderness: There is no abdominal tenderness.     Hernia: There is no hernia in the left inguinal area or right inguinal area.  Genitourinary:    Penis: Normal and circumcised.      Testes: Normal.  Musculoskeletal:        General: Normal range of motion.     Cervical back: Normal range of motion and neck supple.     Right lower leg: No edema.     Left lower leg: No edema.  Skin:    General: Skin is warm and dry.  Neurological:     General: No focal deficit present.     Mental Status: He is alert and oriented to person, place, and time. Mental status is at baseline.  Psychiatric:        Mood and Affect: Mood normal. Affect is angry.        Behavior: Behavior is aggressive.         Assessment & Plan:     1. Annual physical exam  - CMP14+EGFR  2. Screening for deficiency anemia  - CBC with Differential  3. Screening for lipid disorders  - Lipid Panel  4. Screening for endocrine/metabolic/immunity disorders  - TSH - Vitamin D, 25-hydroxy  5. Screening for prostate cancer  - PSA  6. Screening for HIV (human immunodeficiency virus)  - HIV antibody (with reflex)     Patient had a very angry and intimidating demeanor in his interactions with all of staff he encountered including myself. I do not see this being a productive or positive situation at this practice and recommend that perhaps he would be happier and better suited at a practice at a different location and/or with a male  provider.

## 2022-06-03 DIAGNOSIS — R972 Elevated prostate specific antigen [PSA]: Secondary | ICD-10-CM | POA: Diagnosis not present

## 2022-06-03 DIAGNOSIS — N400 Enlarged prostate without lower urinary tract symptoms: Secondary | ICD-10-CM | POA: Diagnosis not present

## 2022-06-09 ENCOUNTER — Other Ambulatory Visit: Payer: Self-pay | Admitting: Urology

## 2022-06-09 DIAGNOSIS — Z125 Encounter for screening for malignant neoplasm of prostate: Secondary | ICD-10-CM

## 2022-06-09 DIAGNOSIS — R972 Elevated prostate specific antigen [PSA]: Secondary | ICD-10-CM

## 2022-06-25 ENCOUNTER — Ambulatory Visit
Admission: RE | Admit: 2022-06-25 | Discharge: 2022-06-25 | Disposition: A | Discharge status: Home / Self Care | Payer: BC Managed Care – PPO | Source: Ambulatory Visit | Attending: Urology | Admitting: Urology

## 2022-06-25 DIAGNOSIS — R972 Elevated prostate specific antigen [PSA]: Secondary | ICD-10-CM

## 2022-06-25 DIAGNOSIS — Z125 Encounter for screening for malignant neoplasm of prostate: Secondary | ICD-10-CM

## 2022-06-25 MED ORDER — GADOPICLENOL 0.5 MMOL/ML IV SOLN
10.0000 mL | Freq: Once | INTRAVENOUS | Status: AC | PRN
Start: 2022-06-25 — End: 2022-06-25
  Administered 2022-06-25: 10 mL via INTRAVENOUS

## 2022-07-01 DIAGNOSIS — C61 Malignant neoplasm of prostate: Secondary | ICD-10-CM | POA: Diagnosis not present

## 2022-07-01 DIAGNOSIS — R972 Elevated prostate specific antigen [PSA]: Secondary | ICD-10-CM | POA: Diagnosis not present

## 2022-07-01 DIAGNOSIS — D075 Carcinoma in situ of prostate: Secondary | ICD-10-CM | POA: Diagnosis not present

## 2022-07-08 DIAGNOSIS — C61 Malignant neoplasm of prostate: Secondary | ICD-10-CM | POA: Diagnosis not present

## 2022-07-14 ENCOUNTER — Telehealth: Payer: Self-pay

## 2022-07-14 NOTE — Telephone Encounter (Signed)
Left message for patient to return my call, will go ahead and send packet and will try and call patient again as appointment gets closer

## 2022-07-30 ENCOUNTER — Encounter: Payer: Self-pay | Admitting: Urology

## 2022-07-30 ENCOUNTER — Telehealth: Payer: Self-pay | Admitting: Genetic Counselor

## 2022-07-30 ENCOUNTER — Ambulatory Visit
Admission: RE | Admit: 2022-07-30 | Discharge: 2022-07-30 | Disposition: A | Discharge status: Home / Self Care | Payer: BC Managed Care – PPO | Source: Ambulatory Visit | Attending: Radiation Oncology | Admitting: Radiation Oncology

## 2022-07-30 VITALS — BP 135/94 | HR 91 | Temp 97.0°F | Resp 18 | Ht 75.0 in | Wt 235.4 lb

## 2022-07-30 DIAGNOSIS — Z191 Hormone sensitive malignancy status: Secondary | ICD-10-CM | POA: Diagnosis not present

## 2022-07-30 DIAGNOSIS — C61 Malignant neoplasm of prostate: Secondary | ICD-10-CM

## 2022-07-30 NOTE — Progress Notes (Signed)
                               Care Plan Summary  Name: Vincent Ray DOB: 07-27-1955   Your Medical Team:   Urologist -  Dr. Raynelle Bring, [facility]  Radiation Oncologist - Dr. Tyler Pita, [place]     Recommendations: 1) Surgery  2) Radiation    * These recommendations are based on information available as of today's consult.      Recommendations may change depending on the results of further tests or exams.    Next Steps: 1) Consider your options.  [place], your nurse navigator, with questions or treatment decision.     When appointments need to be scheduled, you will be contacted by [facility] and/or [facility].  Questions?  Please do not hesitate to call Katheren Puller, BSN, RN at [phone] with any questions or concerns.  Kathlee Nations is your Oncology Nurse Navigator and is available to assist you while you're receiving your medical care at [facility].

## 2022-07-30 NOTE — Telephone Encounter (Signed)
Mr. Vincent Ray was seen by a genetic counselor during the prostate multidisciplinary clinic on 07/30/2022. In addition to his personal history of prostate cancer, he reported no family history of cancer. He does not meet NCCN criteria for genetic testing at this time. He was still offered genetic counseling and testing but declined. We encourage him to contact us if there are any changes to his personal or family history of cancer. If he meets NCCN criteria based on the updated personal/family history, he would be recommended to have genetic counseling and testing.   Lucille Passy, MS, [facility] Genetic Counselor [email or web address] (P) [phone]

## 2022-07-30 NOTE — Consult Note (Signed)
Multi-Disciplinary Clinic     07/30/2022   --------------------------------------------------------------------------------   Fabio Bering  MRN: [id number]  DOB: [birth year], 67 year old Male  SSN:    PRIMARY CARE:     REFERRING:  Wonda Cheng. Williams Che,   PROVIDER:  Wendie Simmer, M.D.  TREATING:  Raynelle Bring, M.D.  LOCATION:  [facility] [phone]     --------------------------------------------------------------------------------   CC/HPI: CC: Prostate Cancer   Physician requesting consult: Dr. Alen Blew  PCP: Dr. Dorna Mai  Location of consult: [facility] - Prostate Cancer Multidisciplinary Clinic   Mr. Lovena Le is a 67 year old healthy gentleman who was found to have an elevated PSA of 5.12. This prompted an MRI of the prostate on 06/25/22 that demonstrated a 1.2 cm PI-RADS 4 lesion of the right mid/apex peripheral zone and another 1.4 cm PI-RADS 3 lesion of the left base. TRUS biopsy of the prostate on 07/01/22 that confirmed Gleason 3+4=7 with 3 out of 4 biopsies of ROI-1, no positive biopsies from ROI-2, and 2 out of 12 systematic biopsies.   Family history: None.   Imaging studies: MRI (06/25/22): No EPE, SVI, LAD, or bone lesions.   PMH: He no major medical comorbidities.  PSH: No abdominal surgeries.   TNM stage: cT1c N0 Mx  PSA: 5.12  Gleason score: 3+4=7 ( GG 2)  Biopsy (07/01/22): 5/20 cores positive  Left: Benign  Right: R apex (60%, 3+3=6), R mid (20%, 3+4=7)  ROI - 1: 3/4 cores (3+4=7 - 10%, 10%, 10%)  ROI - 2: 0/4 cores  Prostate volume: 54.5 cc   Nomogram  OC disease: 72%  EPE: 28%  SVI: 2%  LNI: 2%  PFS (5 year, 10 year): 87%, 78%   Urinary function: IPSS is 0.  Erectile function: SHIM score is 25.     ALLERGIES: No Known Drug Allergies    MEDICATIONS: No Medications    GU PSH: Prostate Needle Biopsy - 07/01/2022     NON-GU PSH: Ankle Arthroscopy/surgery Knee replacement Surgical Pathology,  Gross And Microscopic Examination For Prostate Needle - 07/01/2022     GU PMH: Prostate Cancer - 07/08/2022 Elevated PSA - 07/01/2022, - 06/03/2022 BPH w/o LUTS - 06/03/2022 Encounter for Prostate Cancer screening - 06/03/2022      PMH Notes:  1898-05-24 00:00:00 - Note: Normal Routine History And Physical Adult   NON-GU PMH: No Non-GU PMH    FAMILY HISTORY: No Family History    SOCIAL HISTORY: No Social History    REVIEW OF SYSTEMS:    GU Review Male:   Patient denies frequent urination, hard to postpone urination, burning/ pain with urination, get up at night to urinate, leakage of urine, stream starts and stops, trouble starting your streams, and have to strain to urinate .  Gastrointestinal (Upper):   Patient denies nausea and vomiting.  Gastrointestinal (Lower):   Patient denies diarrhea and constipation.  Constitutional:   Patient denies fever, night sweats, weight loss, and fatigue.  Skin:   Patient denies skin rash/ lesion and itching.  Eyes:   Patient denies double vision and blurred vision.  Ears/ Nose/ Throat:   Patient denies sore throat and sinus problems.  Hematologic/Lymphatic:   Patient denies swollen glands and easy bruising.  Cardiovascular:   Patient denies leg swelling and chest pains.  Respiratory:   Patient denies cough and shortness of breath.  Endocrine:   Patient denies excessive thirst.  Musculoskeletal:   Patient denies back pain  and joint pain.  Neurological:   Patient denies headaches and dizziness.  Psychologic:   Patient denies depression and anxiety.   VITAL SIGNS: None   GU PHYSICAL EXAMINATION:    Prostate: Prostate about 60 grams. There is some irregularity of the prostate mostly consistent with his prior biopsy but no definitive abnormalities that would suggest more locally advanced disease.   MULTI-SYSTEM PHYSICAL EXAMINATION:    Constitutional: Well-nourished. No physical deformities. Normally developed. Good grooming.  Respiratory: No labored  breathing, no use of accessory muscles. Clear bilaterally.  Cardiovascular: Normal temperature, normal extremity pulses, no swelling, no varicosities. Regular rate and rhythm.  Neurologic / Psychiatric: Oriented to time, oriented to place, oriented to person. No depression, no anxiety, no agitation.  Gastrointestinal: No mass, no tenderness, no rigidity, non obese abdomen.      Complexity of Data:  Lab Test Review:   PSA  Records Review:   Pathology Reports, Previous Patient Records   06/03/22  PSA  Total PSA 5.12 ng/mL    PROCEDURES: None   ASSESSMENT: None   PLAN:           Document Letter(s):  Created for Patient: Clinical Summary         Notes:   1. Favorable intermediate risk prostate cancer: I had a detailed discussion with Mr. Elpidio Anis and his wife today regarding his prostate cancer situation and options for management/treatment. The patient was counseled about the natural history of prostate cancer and the standard treatment options that are available for prostate cancer. It was explained to him how his age and life expectancy, clinical stage, Gleason score/prognostic grade group, and PSA (and PSA density) affect his prognosis, the decision to proceed with additional staging studies, as well as how that information influences recommended treatment strategies. We discussed the roles for active surveillance, radiation therapy, surgical therapy, androgen deprivation, as well as ablative therapy and other investigational options for the treatment of prostate cancer as appropriate to his individual cancer situation. We discussed the risks and benefits of these options with regard to their impact on cancer control and also in terms of potential adverse events, complications, and impact on quality of life particularly related to urinary and sexual function. The patient was encouraged to ask questions throughout the discussion today and all questions were answered to his stated  satisfaction. In addition, the patient was provided with and/or directed to appropriate resources and literature for further education about prostate cancer and treatment options.   We discussed surgical therapy for prostate cancer including the different available surgical approaches. We discussed, in detail, the risks and expectations of surgery with regard to cancer control, urinary control, and erectile function as well as the expected postoperative recovery process. Additional risks of surgery including but not limited to bleeding, infection, hernia formation, nerve damage, lymphocele formation, bowel/rectal injury potentially necessitating colostomy, damage to the urinary tract resulting in urine leakage, urethral stricture, and the cardiopulmonary risks such as myocardial infarction, stroke, death, venothromboembolism, etc. were explained. The risk of open surgical conversion for robotic/laparoscopic prostatectomy was also discussed.   Ultimately, he is going to carefully consider his options. He appears to be leaning toward surgical therapy, however. I tentative plan would be to perform a bilateral nerve sparing robot-assisted laparoscopic radical prostatectomy and bilateral pelvic lymphadenectomy. He will notify me if he does wish to proceed in this fashion.   CC: Dr. Alen Blew  Dr. Dorna Mai    E & M CODES: We spent 51 minutes  dedicated to evaluation and management time, including face to face interaction, discussions on coordination of care, documentation, result review, and discussion with others as applicable.

## 2022-07-30 NOTE — Progress Notes (Signed)
Radiation Oncology         ([phone]) [phone] ________________________________  Multidisciplinary Prostate Cancer Clinic  Initial Radiation Oncology Consultation  Name: Vincent Ray MRN: [id number]  Date: 07/30/2022  DOB: 04/12/1956  [id number], Clyde Canterbury, MD  Lucas Mallow, MD   REFERRING PHYSICIAN: Lucas Mallow, MD  DIAGNOSIS: 67 y.o. gentleman with stage T1c adenocarcinoma of the prostate with a Gleason's score of 3+4 and a PSA of 5.12    ICD-10-CM   1. Malignant neoplasm of prostate ([place])  C61       HISTORY OF PRESENT ILLNESS::Vincent Ray is a 67 y.o. gentleman.  He was noted to have an elevated PSA of 5.4 in September 2023 on routine labs with his primary care physician, Dr. Redmond Pulling.  Accordingly, he was referred for evaluation in urology by Dr. Gloriann Loan on 06/03/22,  digital rectal examination performed at that time showed no abnormalities and a repeat PSA remained stable elevated at 5.12. He underwent prostate MRI on 06/25/22 showing a PI-RADS 4 lesion in the right peripheral zone as well as a PI-RADS 3 lesion in the left peripheral zone and left central zone.   The patient proceeded to fusion biopsy of the prostate on 07/01/22.  The prostate volume measured 54.46 cc.  Out of 20 core biopsies, 5 were positive.  The maximum Gleason score was 3+4, and this was seen in three samples from the ROI #1 lesion and the right mid. Additionally, Gleason 3+3 was seen in the right apex.    The patient reviewed the biopsy results with his urologist and he has kindly been referred today to the multidisciplinary prostate cancer clinic for presentation of pathology and radiology studies in our conference for discussion of potential radiation treatment options and clinical evaluation.  He is accompanied by his wife for today's visit.  PREVIOUS RADIATION THERAPY: No  PAST MEDICAL HISTORY:  has a past medical history of Arthritis, Asthma, and Hallux rigidus of right foot.    PAST  SURGICAL HISTORY: Past Surgical History:  Procedure Laterality Date   CALCANEAL OSTEOTOMY Right 05/04/2018   Procedure: MEDIAL CALCANEAL OSTEOTOMY;  Surgeon: Wylene Simmer, MD;  Location: [place];  Service: Orthopedics;  Laterality: Right;   GASTROC RECESSION EXTREMITY Right 05/04/2018   Procedure: RIGHT GASTROC RECESSION;  Surgeon: Wylene Simmer, MD;  Location: [place];  Service: Orthopedics;  Laterality: Right;   HARDWARE REMOVAL Right 05/04/2018   Procedure: MEDIAL MALLEOLUS HARDWARE REMOVAL;  Surgeon: Wylene Simmer, MD;  Location: [place];  Service: Orthopedics;  Laterality: Right;   KNEE SURGERY     ORIF ANKLE FRACTURE Right 11/08/2014   Procedure: OPEN REDUCTION INTERNAL FIXATION (ORIF) RIGHT ANKLE ;  Surgeon: Susa Day, MD;  Location: [facility];  Service: Orthopedics;  Laterality: Right;   TOTAL KNEE ARTHROPLASTY Right 04/23/2016   Procedure: RIGHT TOTAL KNEE ARTHROPLASTY;  Surgeon: Susa Day, MD;  Location: [facility];  Service: Orthopedics;  Laterality: Right;    FAMILY HISTORY: family history includes Dementia in his mother.  SOCIAL HISTORY:  reports that he has never smoked. He has never been exposed to tobacco smoke. He has never used smokeless tobacco. He reports current alcohol use. He reports that he does not use drugs.  ALLERGIES: Patient has no known allergies.  MEDICATIONS:  Current Outpatient Medications  Medication Sig Dispense Refill   aspirin EC 81 MG tablet Take 1 tablet (81 mg total) by mouth 2 (two) times daily. (Patient not taking: Reported on 04/15/2021)  84 tablet 0   docusate sodium (COLACE) 100 MG capsule Take 1 capsule (100 mg total) by mouth 2 (two) times daily. While taking narcotic pain medicine. (Patient not taking: Reported on 04/15/2021) 30 capsule 0   ibuprofen (ADVIL) 200 MG tablet Take 400 mg by mouth every 6 (six) hours as needed for moderate pain.     senna (SENOKOT) 8.6 MG TABS tablet Take 2  tablets (17.2 mg total) by mouth 2 (two) times daily. (Patient not taking: Reported on 04/15/2021) 30 each 0   No current facility-administered medications for this encounter.    REVIEW OF SYSTEMS:  On review of systems, the patient reports that he is doing well overall. He denies any chest pain, shortness of breath, cough, fevers, chills, night sweats, unintended weight changes. He denies any bowel disturbances, and denies abdominal pain, nausea or vomiting. He denies any new musculoskeletal or joint aches or pains. His IPSS was 0, indicating no urinary symptoms. His SHIM was 25, indicating he does not have erectile dysfunction. A complete review of systems is obtained and is otherwise negative.   PHYSICAL EXAM:  Wt Readings from Last 3 Encounters:  07/30/22 235 lb 6.4 oz (106.8 kg)  01/14/22 230 lb (104.3 kg)  12/11/21 227 lb (103 kg)   Temp Readings from Last 3 Encounters:  07/30/22 (!) 97 F (36.1 C)  01/14/22 98.3 F (36.8 C)  12/11/21 98.5 F (36.9 C)   BP Readings from Last 3 Encounters:  07/30/22 (!) 135/94  01/14/22 133/84  12/11/21 (!) 143/90   Pulse Readings from Last 3 Encounters:  07/30/22 91  01/14/22 85  12/11/21 69    /10  In general this is a well appearing African-American man in no acute distress. He's alert and oriented x4 and appropriate throughout the examination. Cardiopulmonary assessment is negative for acute distress and he exhibits normal effort.    KPS = 100  100 - Normal; no complaints; no evidence of disease. 90   - Able to carry on normal activity; minor signs or symptoms of disease. 80   - Normal activity with effort; some signs or symptoms of disease. 17   - Cares for self; unable to carry on normal activity or to do active work. 60   - Requires occasional assistance, but is able to care for most of his personal needs. 50   - Requires considerable assistance and frequent medical care. 62   - Disabled; requires special care and  assistance. 3   - Severely disabled; hospital admission is indicated although death not imminent. 53   - Very sick; hospital admission necessary; active supportive treatment necessary. 10   - Moribund; fatal processes progressing rapidly. 0     - Dead  Karnofsky DA, Abelmann [place], Craver LS and [facility] [phone]) The use of the nitrogen mustards in the palliative treatment of carcinoma: with particular reference to bronchogenic carcinoma Cancer 1 634-56   LABORATORY DATA:  Lab Results  Component Value Date   WBC 5.3 01/14/2022   HGB 15.2 01/14/2022   HCT 44.8 01/14/2022   MCV 90 01/14/2022   PLT 232 01/14/2022   Lab Results  Component Value Date   NA 143 01/14/2022   K 4.2 01/14/2022   CL 105 01/14/2022   CO2 24 01/14/2022   Lab Results  Component Value Date   ALT 27 01/14/2022   AST 25 01/14/2022   ALKPHOS 81 01/14/2022   BILITOT 0.4 01/14/2022     RADIOGRAPHY: No results found.  IMPRESSION/PLAN: 67 y.o. gentleman with Stage T1c adenocarcinoma of the prostate with a Gleason score of 3+4 and a PSA of 5.15.    We discussed the patient's workup and outlined the nature of prostate cancer in this setting. The patient's T stage, Gleason's score, and PSA put him into the favorable intermediate risk group. Accordingly, he is eligible for a variety of potential treatment options including brachytherapy, 5.5 weeks of external radiation, or prostatectomy. We discussed the available radiation techniques, and focused on the details and logistics of delivery. We discussed and outlined the risks, benefits, short and long-term effects associated with radiotherapy and compared and contrasted these with prostatectomy. We discussed the role of SpaceOAR gel in reducing the rectal toxicity associated with radiotherapy. He appears to have a good understanding of his disease and our treatment recommendations which are of curative intent.  He was encouraged to ask questions that were answered to his  stated satisfaction.  At the end of the conversation the patient is undecided regarding his treatment preference and would like to take some additional time to consider his options.  We will share our discussion with Dr. Gloriann Loan and the patient has our contact information so that he can let us know once he reaches a decision.  Should he ultimately decide to proceed with one of the radiation options, we will be more than happy to coordinate his care, in anticipation of beginning his treatments in the near future.  We enjoyed meeting him and his wife today and look forward to continuing to participate in his care.  They know that they are welcome to call at anytime with any further questions or concerns related to the radiation treatment options discussed today.  We personally spent 60 minutes in this encounter including chart review, reviewing radiological studies, meeting face-to-face with the patient, entering orders and completing documentation.    Nicholos Johns, PA-C    Tyler Pita, MD  [facility] Direct Dial: [phone]  Fax: [phone] [email or web address]  Skype  [email or web address]   This document serves as a record of services personally performed by Tyler Pita, MD and Freeman Caldron, PA-C. It was created on their behalf by Wilburn Mylar, a trained medical scribe. The creation of this record is based on the scribe's personal observations and the provider's statements to them. This document has been checked and approved by the attending provider.

## 2022-08-06 ENCOUNTER — Telehealth: Payer: Self-pay | Admitting: *Deleted

## 2022-08-06 NOTE — Telephone Encounter (Signed)
Called patient to update and to let him know that it is being worked on, spoke with patient and he is aware of this

## 2022-08-06 NOTE — Progress Notes (Signed)
Patient was presented at [facility] on 07/30/2022 for his stage T1c adenocarcinoma of the prostate with a Gleason score of 3+4 and a PSA of 5.15.   Patient has decided to proceed with 5.5 weeks of daily radiation.  RN provided education on next steps for fiducial's, and spaceOAR.  Pt verbalized understanding.    Plan of care in progress.

## 2022-08-11 ENCOUNTER — Other Ambulatory Visit: Payer: Self-pay | Admitting: Urology

## 2022-08-13 NOTE — Progress Notes (Signed)
RN left message for call back to review change in treatment decision.

## 2022-08-16 NOTE — Progress Notes (Signed)
Patient has confirmed he would like to proceed with 5.5 weeks of daily radiation vs surgery.   Patient was provided education on next steps, verbalized understanding.   Plan of care in progress.

## 2022-08-16 NOTE — Progress Notes (Signed)
RN spoke with patient to review treatment decision due to recent fiducial marker's being cancelled.   Pt verbalized he decided to proceed with surgery so he can move forward with treatment quicker than radiation. RN educated on time frame for scheduling surgery.  Pt verbalized frustration with time frame of receiving treatment.  RN provided education on process and provided reassurance given his favorable intermediate risk group.    Pt will take some additional time to consider education provided prior to finalizing treatment decision.

## 2022-08-17 ENCOUNTER — Encounter: Payer: Self-pay | Admitting: General Practice

## 2022-08-17 ENCOUNTER — Other Ambulatory Visit (HOSPITAL_COMMUNITY): Payer: Self-pay | Admitting: Urology

## 2022-08-17 ENCOUNTER — Other Ambulatory Visit: Payer: Self-pay | Admitting: Urology

## 2022-08-17 DIAGNOSIS — C61 Malignant neoplasm of prostate: Secondary | ICD-10-CM

## 2022-08-17 NOTE — Progress Notes (Signed)
[facility] Spiritual Care Note  Referred by nursing for additional layer of support. Reached Vincent Ray by phone to introduce Spiritual Care as part of his team.  Provided reflective listening, emotional support, and normalization of feelings as he shared and processed his experience of moving from learning of elevated PSA to receiving his diagnosis and care plan, which has been marked by more waiting than he would have liked. Affirmed and celebrated how much internal work he has done to process his experience and adjust his expectations. He is planning a trip to [facility] in June to mark the conclusion of his radiation.  We plan to follow up during his radiation treatment, and Vincent Ray has direct Spiritual Care number in case needs arise in the meantime.   [place], [place], [facility] Pager [phone] Voicemail [phone]

## 2022-09-03 ENCOUNTER — Encounter (HOSPITAL_BASED_OUTPATIENT_CLINIC_OR_DEPARTMENT_OTHER): Payer: Self-pay | Admitting: Urology

## 2022-09-03 ENCOUNTER — Other Ambulatory Visit: Payer: Self-pay

## 2022-09-03 ENCOUNTER — Other Ambulatory Visit: Payer: Self-pay | Admitting: Urology

## 2022-09-03 NOTE — Progress Notes (Signed)
Spoke w/ via phone for pre-op interview---pt Lab needs dos---- none               Lab results------none COVID test -----patient states asymptomatic no test needed Arrive at -------530 am NPO after MN NO Solid Food.  Clear liquids from MN until---430 am Med rec completed Medications to take morning of surgery -----none Diabetic medication -----n/a Patient instructed no nail polish to be worn day of surgery Patient instructed to bring photo id and insurance card day of surgery Patient aware to have Driver (ride ) / caregiver  wife Financial trader   for 24 hours after surgery  Patient Special Instructions -----fleets enema night before surgery Pre-Op special Istructions -----none Patient verbalized understanding of instructions that were given at this phone interview. Patient denies shortness of breath, chest pain, fever, cough at this phone interview.

## 2022-09-14 NOTE — Anesthesia Preprocedure Evaluation (Signed)
Anesthesia Evaluation  Patient identified by MRN, date of birth, ID band Patient awake    Reviewed: Allergy & Precautions, NPO status , Patient's Chart, lab work & pertinent test results  History of Anesthesia Complications Negative for: history of anesthetic complications  Airway Mallampati: III  TM Distance: >3 FB Neck ROM: Full    Dental  (+) Dental Advisory Given, Teeth Intact   Pulmonary asthma    Pulmonary exam normal        Cardiovascular negative cardio ROS Normal cardiovascular exam     Neuro/Psych  PSYCHIATRIC DISORDERS  Depression    negative neurological ROS     GI/Hepatic negative GI ROS, Neg liver ROS,,,  Endo/Other  negative endocrine ROS    Renal/GU negative Renal ROS    Prostate cancer     Musculoskeletal  (+) Arthritis ,    Abdominal   Peds  Hematology negative hematology ROS (+)   Anesthesia Other Findings   Reproductive/Obstetrics                             Anesthesia Physical Anesthesia Plan  ASA: 2  Anesthesia Plan: MAC   Post-op Pain Management: Tylenol PO (pre-op)*   Induction:   PONV Risk Score and Plan: 1 and Propofol infusion and Treatment may vary due to age or medical condition  Airway Management Planned: Natural Airway and Simple Face Mask  Additional Equipment: None  Intra-op Plan:   Post-operative Plan:   Informed Consent: I have reviewed the patients History and Physical, chart, labs and discussed the procedure including the risks, benefits and alternatives for the proposed anesthesia with the patient or authorized representative who has indicated his/her understanding and acceptance.       Plan Discussed with: CRNA and Anesthesiologist  Anesthesia Plan Comments:        Anesthesia Quick Evaluation

## 2022-09-15 ENCOUNTER — Encounter (HOSPITAL_BASED_OUTPATIENT_CLINIC_OR_DEPARTMENT_OTHER): Payer: Self-pay | Admitting: Urology

## 2022-09-15 ENCOUNTER — Ambulatory Visit (HOSPITAL_BASED_OUTPATIENT_CLINIC_OR_DEPARTMENT_OTHER): Payer: BC Managed Care – PPO | Admitting: Anesthesiology

## 2022-09-15 ENCOUNTER — Ambulatory Visit (HOSPITAL_BASED_OUTPATIENT_CLINIC_OR_DEPARTMENT_OTHER): Admit: 2022-09-15 | Payer: BC Managed Care – PPO | Admitting: Urology

## 2022-09-15 ENCOUNTER — Ambulatory Visit (HOSPITAL_COMMUNITY): Payer: BC Managed Care – PPO

## 2022-09-15 ENCOUNTER — Telehealth: Payer: Self-pay | Admitting: *Deleted

## 2022-09-15 ENCOUNTER — Encounter (HOSPITAL_BASED_OUTPATIENT_CLINIC_OR_DEPARTMENT_OTHER): Payer: Self-pay

## 2022-09-15 ENCOUNTER — Encounter (HOSPITAL_BASED_OUTPATIENT_CLINIC_OR_DEPARTMENT_OTHER)
Admission: RE | Disposition: A | Discharge status: Home / Self Care | Payer: Self-pay | Source: Home / Self Care | Attending: Urology

## 2022-09-15 ENCOUNTER — Other Ambulatory Visit: Payer: Self-pay

## 2022-09-15 ENCOUNTER — Ambulatory Visit (HOSPITAL_BASED_OUTPATIENT_CLINIC_OR_DEPARTMENT_OTHER)
Admission: RE | Admit: 2022-09-15 | Discharge: 2022-09-15 | Disposition: A | Discharge status: Home / Self Care | Payer: BC Managed Care – PPO | Attending: Urology | Admitting: Urology

## 2022-09-15 DIAGNOSIS — J45909 Unspecified asthma, uncomplicated: Secondary | ICD-10-CM | POA: Insufficient documentation

## 2022-09-15 DIAGNOSIS — C61 Malignant neoplasm of prostate: Secondary | ICD-10-CM | POA: Insufficient documentation

## 2022-09-15 DIAGNOSIS — Z01818 Encounter for other preprocedural examination: Secondary | ICD-10-CM

## 2022-09-15 DIAGNOSIS — M1990 Unspecified osteoarthritis, unspecified site: Secondary | ICD-10-CM | POA: Insufficient documentation

## 2022-09-15 HISTORY — DX: Malignant (primary) neoplasm, unspecified: C80.1

## 2022-09-15 HISTORY — DX: Presence of spectacles and contact lenses: Z97.3

## 2022-09-15 HISTORY — PX: SPACE OAR INSTILLATION: SHX6769

## 2022-09-15 HISTORY — PX: GOLD SEED IMPLANT: SHX6343

## 2022-09-15 SURGERY — INSERTION, GOLD SEEDS
Anesthesia: Monitor Anesthesia Care | Site: Prostate

## 2022-09-15 SURGERY — INSERTION, GOLD SEEDS
Anesthesia: Monitor Anesthesia Care

## 2022-09-15 MED ORDER — SODIUM CHLORIDE (PF) 0.9 % IJ SOLN
INTRAMUSCULAR | Status: DC | PRN
  Administered 2022-09-15: 10 mL

## 2022-09-15 MED ORDER — FENTANYL CITRATE (PF) 100 MCG/2ML IJ SOLN
INTRAMUSCULAR | Status: DC | PRN
  Administered 2022-09-15 (×2): 25 ug via INTRAVENOUS

## 2022-09-15 MED ORDER — HYDROCODONE-ACETAMINOPHEN 5-325 MG PO TABS: 1.0000 | ORAL_TABLET | ORAL | 0 refills | Status: DC | PRN

## 2022-09-15 MED ORDER — SODIUM CHLORIDE FLUSH 0.9 % IV SOLN: INTRAVENOUS | Status: DC | PRN

## 2022-09-15 MED ORDER — PROMETHAZINE HCL 25 MG/ML IJ SOLN: 6.2500 mg | INTRAMUSCULAR | Status: DC | PRN

## 2022-09-15 MED ORDER — OXYCODONE HCL 5 MG PO TABS: 5.0000 mg | ORAL_TABLET | Freq: Once | ORAL | Status: DC | PRN

## 2022-09-15 MED ORDER — ACETAMINOPHEN 500 MG PO TABS
ORAL_TABLET | ORAL | Status: AC
  Filled 2022-09-15: qty 2

## 2022-09-15 MED ORDER — LIDOCAINE HCL 2 % IJ SOLN
INTRAMUSCULAR | Status: DC | PRN
  Administered 2022-09-15: 10 mL

## 2022-09-15 MED ORDER — FENTANYL CITRATE (PF) 100 MCG/2ML IJ SOLN: 25.0000 ug | INTRAMUSCULAR | Status: DC | PRN

## 2022-09-15 MED ORDER — FENTANYL CITRATE (PF) 100 MCG/2ML IJ SOLN
INTRAMUSCULAR | Status: AC
  Filled 2022-09-15: qty 2

## 2022-09-15 MED ORDER — DEXAMETHASONE SODIUM PHOSPHATE 4 MG/ML IJ SOLN
INTRAMUSCULAR | Status: DC | PRN
  Administered 2022-09-15: 5 mg via INTRAVENOUS

## 2022-09-15 MED ORDER — ACETAMINOPHEN 500 MG PO TABS
1000.0000 mg | ORAL_TABLET | Freq: Once | ORAL | Status: AC
  Administered 2022-09-15: 1000 mg via ORAL

## 2022-09-15 MED ORDER — LIDOCAINE HCL (PF) 2 % IJ SOLN
INTRAMUSCULAR | Status: AC
  Filled 2022-09-15: qty 5

## 2022-09-15 MED ORDER — MIDAZOLAM HCL 5 MG/5ML IJ SOLN
INTRAMUSCULAR | Status: DC | PRN
  Administered 2022-09-15: 2 mg via INTRAVENOUS

## 2022-09-15 MED ORDER — PROPOFOL 500 MG/50ML IV EMUL
INTRAVENOUS | Status: DC | PRN
  Administered 2022-09-15: 50 ug/kg/min via INTRAVENOUS

## 2022-09-15 MED ORDER — CEFAZOLIN SODIUM-DEXTROSE 2-4 GM/100ML-% IV SOLN
INTRAVENOUS | Status: AC
  Filled 2022-09-15: qty 100

## 2022-09-15 MED ORDER — GLYCOPYRROLATE PF 0.2 MG/ML IJ SOSY
PREFILLED_SYRINGE | INTRAMUSCULAR | Status: AC
  Filled 2022-09-15: qty 1

## 2022-09-15 MED ORDER — DEXAMETHASONE SODIUM PHOSPHATE 10 MG/ML IJ SOLN
INTRAMUSCULAR | Status: AC
  Filled 2022-09-15: qty 1

## 2022-09-15 MED ORDER — GLYCOPYRROLATE 0.2 MG/ML IJ SOLN
INTRAMUSCULAR | Status: DC | PRN
  Administered 2022-09-15: .2 mg via INTRAVENOUS

## 2022-09-15 MED ORDER — PROPOFOL 10 MG/ML IV BOLUS
INTRAVENOUS | Status: DC | PRN
  Administered 2022-09-15: 20 mg via INTRAVENOUS

## 2022-09-15 MED ORDER — CEFAZOLIN SODIUM-DEXTROSE 2-4 GM/100ML-% IV SOLN
2.0000 g | INTRAVENOUS | Status: AC
  Administered 2022-09-15: 2 g via INTRAVENOUS

## 2022-09-15 MED ORDER — OXYCODONE HCL 5 MG/5ML PO SOLN: 5.0000 mg | Freq: Once | ORAL | Status: DC | PRN

## 2022-09-15 MED ORDER — PROPOFOL 500 MG/50ML IV EMUL
INTRAVENOUS | Status: AC
  Filled 2022-09-15: qty 50

## 2022-09-15 MED ORDER — LACTATED RINGERS IV SOLN: INTRAVENOUS | Status: DC

## 2022-09-15 MED ORDER — ONDANSETRON HCL 4 MG/2ML IJ SOLN
INTRAMUSCULAR | Status: AC
  Filled 2022-09-15: qty 2

## 2022-09-15 MED ORDER — ONDANSETRON HCL 4 MG/2ML IJ SOLN
INTRAMUSCULAR | Status: DC | PRN
  Administered 2022-09-15: 4 mg via INTRAVENOUS

## 2022-09-15 MED ORDER — PROPOFOL 10 MG/ML IV BOLUS
INTRAVENOUS | Status: AC
  Filled 2022-09-15: qty 20

## 2022-09-15 MED ORDER — MIDAZOLAM HCL 2 MG/2ML IJ SOLN
INTRAMUSCULAR | Status: AC
  Filled 2022-09-15: qty 2

## 2022-09-15 SURGICAL SUPPLY — 26 items
BLADE CLIPPER SENSICLIP SURGIC (BLADE) ×2 IMPLANT
CNTNR URN SCR LID CUP LEK RST (MISCELLANEOUS) ×2 IMPLANT
CONT SPEC 4OZ STRL OR WHT (MISCELLANEOUS) ×2
COVER BACK TABLE 60X90IN (DRAPES) ×2 IMPLANT
DRSG TEGADERM 4X4.75 (GAUZE/BANDAGES/DRESSINGS) ×2 IMPLANT
DRSG TEGADERM 8X12 (GAUZE/BANDAGES/DRESSINGS) ×2 IMPLANT
GAUZE SPONGE 4X4 12PLY STRL (GAUZE/BANDAGES/DRESSINGS) ×2 IMPLANT
GLOVE BIO SURGEON STRL SZ7.5 (GLOVE) ×2 IMPLANT
GLOVE ECLIPSE 8.0 STRL XLNG CF (GLOVE) ×2 IMPLANT
GLOVE SURG ORTHO 8.5 STRL (GLOVE) ×2 IMPLANT
IMPL SPACEOAR VUE SYSTEM (Spacer) ×2 IMPLANT
IMPLANT SPACEOAR VUE SYSTEM (Spacer) ×2 IMPLANT
KIT TURNOVER CYSTO (KITS) ×2 IMPLANT
MARKER GOLD PRELOAD 1.2X3 (Urological Implant) ×2 IMPLANT
MARKER SKIN DUAL TIP RULER LAB (MISCELLANEOUS) ×2 IMPLANT
NDL SPNL 22GX3.5 QUINCKE BK (NEEDLE) IMPLANT
NEEDLE SPNL 22GX3.5 QUINCKE BK (NEEDLE) IMPLANT
SEED GOLD PRELOAD 1.2X3 (Urological Implant) ×6 IMPLANT
SHEATH ULTRASOUND LF (SHEATH) IMPLANT
SHEATH ULTRASOUND LTX NONSTRL (SHEATH) IMPLANT
SLEEVE SCD COMPRESS KNEE MED (STOCKING) ×2 IMPLANT
SURGILUBE 2OZ TUBE FLIPTOP (MISCELLANEOUS) ×2 IMPLANT
SYR 10ML LL (SYRINGE) IMPLANT
SYR CONTROL 10ML LL (SYRINGE) ×2 IMPLANT
TOWEL OR 17X24 6PK STRL BLUE (TOWEL DISPOSABLE) ×2 IMPLANT
UNDERPAD 30X36 HEAVY ABSORB (UNDERPADS AND DIAPERS) ×2 IMPLANT

## 2022-09-15 NOTE — Telephone Encounter (Signed)
Called patient to remind of sim appt. for 09-17-22- arrival time-8:45 am @ [facility], informed patient to arrive with a full bladder, spoke with patient and he is aware of this appt. and the instructions

## 2022-09-15 NOTE — Transfer of Care (Signed)
Immediate Anesthesia Transfer of Care Note  Patient: Vincent Ray  Procedure(s) Performed: Procedure(s) (LRB): GOLD SEED IMPLANT (N/A) SPACE OAR INSTILLATION (N/A)  Patient Location: PACU  Anesthesia Type: MAC  Level of Consciousness: awake, sedated, patient cooperative and responds to stimulation  Airway & Oxygen Therapy: Patient Spontanous Breathing and Patient connected to [facility] oxygen  Post-op Assessment: Report given to PACU RN, Post -op Vital signs reviewed and stable and Patient moving all extremities  Post vital signs: Reviewed and stable  Complications: No apparent anesthesia complications

## 2022-09-15 NOTE — H&P (Addendum)
H&P   Chief Complaint: Prostate cancer  History of Present Illness: 67 year old male with prostate cancer presents for gold seed marker and SpaceOAR.  Past Medical History:  Diagnosis Date   Arthritis    Asthma    As a child no problems now   Hallux rigidus of right foot    prostate cancer    Wears glasses    Past Surgical History:  Procedure Laterality Date   CALCANEAL OSTEOTOMY Right 05/04/2018   Procedure: MEDIAL CALCANEAL OSTEOTOMY;  Surgeon: Toni Arthurs, MD;  Location: [facility];  Service: Orthopedics;  Laterality: Right;   GASTROC RECESSION EXTREMITY Right 05/04/2018   Procedure: RIGHT GASTROC RECESSION;  Surgeon: Toni Arthurs, MD;  Location: [facility];  Service: Orthopedics;  Laterality: Right;   HARDWARE REMOVAL Right 05/04/2018   Procedure: MEDIAL MALLEOLUS HARDWARE REMOVAL;  Surgeon: Toni Arthurs, MD;  Location: [facility];  Service: Orthopedics;  Laterality: Right;   KNEE SURGERY     arthroscopy right knee   ORIF ANKLE FRACTURE Right 11/08/2014   Procedure: OPEN REDUCTION INTERNAL FIXATION (ORIF) RIGHT ANKLE ;  Surgeon: Jene Every, MD;  Location: [facility];  Service: Orthopedics;  Laterality: Right;   TOTAL KNEE ARTHROPLASTY Right 04/23/2016   Procedure: RIGHT TOTAL KNEE ARTHROPLASTY;  Surgeon: Jene Every, MD;  Location: [facility];  Service: Orthopedics;  Laterality: Right;    Home Medications:  Medications Prior to Admission  Medication Sig Dispense Refill Last Dose   aspirin EC 81 MG tablet Take 1 tablet (81 mg total) by mouth 2 (two) times daily. (Patient not taking: Reported on 04/15/2021) 84 tablet 0    senna (SENOKOT) 8.6 MG TABS tablet Take 2 tablets (17.2 mg total) by mouth 2 (two) times daily. (Patient not taking: Reported on 04/15/2021) 30 each 0    Allergies: No Known Allergies  Family History  Problem Relation Age of Onset   Dementia Mother    Colon cancer Neg Hx    Social History:  reports that he has  never smoked. He has never been exposed to tobacco smoke. He has never used smokeless tobacco. He reports current alcohol use. He reports that he does not use drugs.  ROS: A complete review of systems was performed.  All systems are negative except for pertinent findings as noted. ROS   Physical Exam:  Vital signs in last 24 hours: Temp:  [97.7 F (36.5 C)] 97.7 F (36.5 C) (04/24 0559) Pulse Rate:  [74] 74 (04/24 0559) Resp:  [17] 17 (04/24 0559) BP: (153)/(95) 153/95 (04/24 0559) SpO2:  [96 %] 96 % (04/24 0559) Weight:  [105.6 kg] 105.6 kg (04/24 0559) General:  Alert and oriented, No acute distress HEENT: Normocephalic, atraumatic Neck: No JVD or lymphadenopathy Cardiovascular: Regular rate and rhythm Lungs: Regular rate and effort Abdomen: Soft, nontender, nondistended, no abdominal masses Back: No CVA tenderness Extremities: No edema Neurologic: Grossly intact  Laboratory Data:  No results found for this or any previous visit (from the past 24 hour(s)). No results found for this or any previous visit (from the past 240 hour(s)). Creatinine: No results for input(s): "CREATININE" in the last 168 hours.  Impression/Assessment:  Prostate cancer  Plan:  Proceed with gold seed marker and SpaceOAR.  Risk and benefits discussed  Ray Church, III 09/15/2022, 6:53 AM '

## 2022-09-15 NOTE — Op Note (Signed)
Preoperative diagnosis: Clinically localized adenocarcinoma of the prostate   Postoperative diagnosis: Clinically localized adenocarcinoma of the prostate  Procedure: 1) Placement of fiducial markers into prostate                    2) Insertion of SpaceOAR hydrogel   Surgeon: Modena Slater, M.D.  Anesthesia: MAC sedation  EBL: Minimal  Complications: None  Indication: Vincent Ray is a 67 y.o. gentleman with clinically localized prostate cancer. After discussing management options for treatment, he elected to proceed with radiotherapy. He presents today for the above procedures. The potential risks, complications, alternative options, and expected recovery course have been discussed in detail with the patient and he has provided informed consent to proceed.  Description of procedure: The patient was administered preoperative antibiotics, placed in the dorsal lithotomy position, and prepped and draped in the usual sterile fashion. Next, transrectal ultrasonography was utilized to visualize the prostate.  The perineum was anesthetized with quarter percent Marcaine. Three gold fiducial markers were then placed into the prostate via transperineal needles under ultrasound guidance at the right apex, right base, and left mid gland under direct ultrasound guidance. A site in the midline was then selected on the perineum for placement of an 18 g needle with saline. The needle was advanced above the rectum and below Denonvillier's fascia to the mid gland and confirmed to be in the midline on transverse imaging. One cc of saline was injected confirming appropriate expansion of this space. A total of 5 cc of saline was then injected to open the space further bilaterally. The saline syringe was then removed and the SpaceOAR hydrogel was injected with good distribution bilaterally. He tolerated the procedure well and without complications. He was given a voiding trial prior to discharge from the PACU.

## 2022-09-15 NOTE — Anesthesia Postprocedure Evaluation (Signed)
Anesthesia Post Note  Patient: Vincent Ray  Procedure(s) Performed: GOLD SEED IMPLANT (Prostate) SPACE OAR INSTILLATION (Perineum)     Patient location during evaluation: PACU Anesthesia Type: MAC Level of consciousness: awake and alert Pain management: pain level controlled Vital Signs Assessment: post-procedure vital signs reviewed and stable Respiratory status: spontaneous breathing, nonlabored ventilation and respiratory function stable Cardiovascular status: stable and blood pressure returned to baseline Anesthetic complications: no   No notable events documented.  Last Vitals:  Vitals:   09/15/22 0845 09/15/22 0900  BP: 121/84 125/86  Pulse: 66 66  Resp: 13 12  Temp:  (!) 36.3 C  SpO2: 94% 94%    Last Pain:  Vitals:   09/15/22 0845  TempSrc:   PainSc: 0-No pain                 Vincent Ray

## 2022-09-15 NOTE — Discharge Instructions (Addendum)
Antibiotics You may be given a prescription for an antibiotic to take when you arrive home. If so, be sure to take every tablet in the bottle, even if you are feeling better before the prescription is finished. If you begin itching, notice a rash or start to swell on your trunk, arms, legs and/or throat, immediately stop taking the antibiotic and call your Urologist. Diet Resume your usual diet when you return home. To keep your bowels moving easily and softly, drink prune, apple and cranberry juice at room temperature. You may also take a stool softener, such as Colace, which is available without prescription at local pharmacies. Daily activities No driving or heavy lifting for at least two days after the implant. No bike riding, horseback riding or riding lawn mowers for the first month after the implant. Any strenuous physical activity should be approved by your doctor before you resume it. Sexual relations You may resume sexual relations two weeks after the procedure. Your semen may be dark brown or black; this is normal and is related bleeding that may have occurred during the implant. Postoperative swelling Expect swelling and bruising of the scrotum and perineum (the area between the scrotum and anus). Both the swelling and the bruising should resolve in l or 2 weeks. Ice packs and over- the-counter medications such as Tylenol, Advil or Aleve may lessen your discomfort. Postoperative urination Most men experience burning on urination and/or urinary frequency. If this becomes bothersome, contact your Urologist.  Medication can be prescribed to relieve these problems.  It is normal to have some blood in your urine for a few days after the implant.  Contact your doctor for Temperature greater than 101 F Increasing pain Inability to urinate Follow-up  You should have follow up with your urologist and radiation oncologist about 3 weeks after the procedure.   Post Anesthesia Home Care  Instructions  Activity: Get plenty of rest for the remainder of the day. A responsible individual must stay with you for 24 hours following the procedure.  For the next 24 hours, DO NOT: -Drive a car -Advertising copywriter -Drink alcoholic beverages -Take any medication unless instructed by your physician -Make any legal decisions or sign important papers.  Meals: Start with liquid foods such as gelatin or soup. Progress to regular foods as tolerated. Avoid greasy, spicy, heavy foods. If nausea and/or vomiting occur, drink only clear liquids until the nausea and/or vomiting subsides. Call your physician if vomiting continues.  Special Instructions/Symptoms: Your throat may feel dry or sore from the anesthesia or the breathing tube placed in your throat during surgery. If this causes discomfort, gargle with warm salt water. The discomfort should disappear within 24 hours.  May take Tylenol beginning at 48 Noon today as needed for soreness/discomfort.

## 2022-09-15 NOTE — Anesthesia Procedure Notes (Signed)
Procedure Name: MAC Date/Time: 09/15/2022 7:30 AM  Performed by: Jessica Priest, CRNAPre-anesthesia Checklist: Timeout performed, Patient being monitored, Suction available, Emergency Drugs available and Patient identified Patient Re-evaluated:Patient Re-evaluated prior to induction Oxygen Delivery Method: Simple face mask Preoxygenation: Pre-oxygenation with 100% oxygen Induction Type: IV induction Placement Confirmation: breath sounds checked- equal and bilateral, CO2 detector and positive ETCO2

## 2022-09-16 ENCOUNTER — Encounter (HOSPITAL_BASED_OUTPATIENT_CLINIC_OR_DEPARTMENT_OTHER): Payer: Self-pay | Admitting: Urology

## 2022-09-17 ENCOUNTER — Ambulatory Visit: Payer: BC Managed Care – PPO | Admitting: Radiation Oncology

## 2022-09-17 ENCOUNTER — Ambulatory Visit
Admission: RE | Admit: 2022-09-17 | Discharge: 2022-09-17 | Disposition: A | Discharge status: Home / Self Care | Payer: BC Managed Care – PPO | Source: Ambulatory Visit | Attending: Radiation Oncology | Admitting: Radiation Oncology

## 2022-09-17 DIAGNOSIS — C61 Malignant neoplasm of prostate: Secondary | ICD-10-CM | POA: Diagnosis not present

## 2022-09-17 DIAGNOSIS — Z191 Hormone sensitive malignancy status: Secondary | ICD-10-CM | POA: Diagnosis not present

## 2022-09-17 NOTE — Progress Notes (Signed)
  Radiation Oncology         [phone] ________________________________  Name: Vincent Ray MRN: [id number]  Date: 09/17/2022  DOB: [birth year]  SIMULATION AND TREATMENT PLANNING NOTE    ICD-10-CM   1. Malignant neoplasm of prostate (HCC)  C61       DIAGNOSIS:  67 y.o. gentleman with stage T1c adenocarcinoma of the prostate with a Gleason's score of 3+4 and a PSA of 5.12   NARRATIVE:  The patient was brought to the CT Simulation planning suite.  Identity was confirmed.  All relevant records and images related to the planned course of therapy were reviewed.  The patient freely provided informed written consent to proceed with treatment after reviewing the details related to the planned course of therapy. The consent form was witnessed and verified by the simulation staff.  Then, the patient was set-up in a stable reproducible supine position for radiation therapy.  A vacuum lock pillow device was custom fabricated to position his legs in a reproducible immobilized position.  Then, I performed a urethrogram under sterile conditions to identify the prostatic apex.  CT images were obtained.  Surface markings were placed.  The CT images were loaded into the planning software.  Then the prostate target and avoidance structures including the rectum, bladder, bowel and hips were contoured.  Treatment planning then occurred.  The radiation prescription was entered and confirmed.  A total of one complex treatment devices was fabricated. I have requested : Intensity Modulated Radiotherapy (IMRT) is medically necessary for this case for the following reason:  Rectal sparing.Marland Kitchen  PLAN:  The patient will receive 70 Gy in 28 fractions.  ________________________________  Artist Pais Kathrynn Running, M.D.

## 2022-09-22 ENCOUNTER — Ambulatory Visit (INDEPENDENT_AMBULATORY_CARE_PROVIDER_SITE_OTHER): Payer: BC Managed Care – PPO | Admitting: Family Medicine

## 2022-09-22 ENCOUNTER — Encounter: Payer: Self-pay | Admitting: Family Medicine

## 2022-09-22 VITALS — BP 134/93 | HR 85 | Temp 98.5°F | Resp 16 | Wt 234.4 lb

## 2022-09-22 DIAGNOSIS — I10 Essential (primary) hypertension: Secondary | ICD-10-CM

## 2022-09-22 MED ORDER — AMLODIPINE BESYLATE 10 MG PO TABS: 10.0000 mg | ORAL_TABLET | Freq: Every day | ORAL | 0 refills | Status: DC

## 2022-09-22 NOTE — Progress Notes (Signed)
Established Patient Office Visit  Subjective    Patient ID: Vincent Ray, male    DOB: 1956/05/01  Age: 67 y.o. MRN: [id number]  CC: No chief complaint on file.   HPI Vincent Ray presents for follow up of hypertension. Patient denies acute complaints.    Outpatient Encounter Medications as of 09/22/2022  Medication Sig   amLODipine (NORVASC) 10 MG tablet Take 1 tablet (10 mg total) by mouth daily.   [DISCONTINUED] aspirin EC 81 MG tablet Take 1 tablet (81 mg total) by mouth 2 (two) times daily. (Patient not taking: Reported on 04/15/2021)   [DISCONTINUED] HYDROcodone-acetaminophen (NORCO/VICODIN) 5-325 MG tablet Take 1 tablet by mouth every 4 (four) hours as needed for moderate pain. (Patient not taking: Reported on 09/22/2022)   [DISCONTINUED] senna (SENOKOT) 8.6 MG TABS tablet Take 2 tablets (17.2 mg total) by mouth 2 (two) times daily. (Patient not taking: Reported on 04/15/2021)   No facility-administered encounter medications on file as of 09/22/2022.    Past Medical History:  Diagnosis Date   Arthritis    Asthma    As a child no problems now   Hallux rigidus of right foot    prostate cancer    Wears glasses     Past Surgical History:  Procedure Laterality Date   CALCANEAL OSTEOTOMY Right 05/04/2018   Procedure: MEDIAL CALCANEAL OSTEOTOMY;  Surgeon: Toni Arthurs, MD;  Location: [facility];  Service: Orthopedics;  Laterality: Right;   GASTROC RECESSION EXTREMITY Right 05/04/2018   Procedure: RIGHT GASTROC RECESSION;  Surgeon: Toni Arthurs, MD;  Location: [facility];  Service: Orthopedics;  Laterality: Right;   GOLD SEED IMPLANT N/A 09/15/2022   Procedure: GOLD SEED IMPLANT;  Surgeon: Crista Elliot, MD;  Location: [facility];  Service: Urology;  Laterality: N/A;   HARDWARE REMOVAL Right 05/04/2018   Procedure: MEDIAL MALLEOLUS HARDWARE REMOVAL;  Surgeon: Toni Arthurs, MD;  Location: [facility];  Service: Orthopedics;  Laterality: Right;   KNEE SURGERY     arthroscopy right knee   ORIF ANKLE FRACTURE Right 11/08/2014   Procedure: OPEN REDUCTION INTERNAL FIXATION (ORIF) RIGHT ANKLE ;  Surgeon: Jene Every, MD;  Location: [facility];  Service: Orthopedics;  Laterality: Right;   SPACE OAR INSTILLATION N/A 09/15/2022   Procedure: SPACE OAR INSTILLATION;  Surgeon: Crista Elliot, MD;  Location: [facility];  Service: Urology;  Laterality: N/A;   TOTAL KNEE ARTHROPLASTY Right 04/23/2016   Procedure: RIGHT TOTAL KNEE ARTHROPLASTY;  Surgeon: Jene Every, MD;  Location: [facility];  Service: Orthopedics;  Laterality: Right;    Family History  Problem Relation Age of Onset   Dementia Mother    Colon cancer Neg Hx     Social History   Socioeconomic History   Marital status: Married    Spouse name: Not on file   Number of children: Not on file   Years of education: Not on file   Highest education level: Not on file  Occupational History   Not on file  Tobacco Use   Smoking status: Never    Passive exposure: Never   Smokeless tobacco: Never  Vaping Use   Vaping Use: Never used  Substance and Sexual Activity   Alcohol use: Yes    Comment: occasionally    Drug use: No   Sexual activity: Not on file  Other Topics Concern   Not on file  Social History Narrative   Not on file  Social Determinants of Health   Financial Resource Strain: Not on file  Food Insecurity: Not on file  Transportation Needs: Not on file  Physical Activity: Not on file  Stress: Not on file  Social Connections: Not on file  Intimate Partner Violence: Not on file    Review of Systems  All other systems reviewed and are negative.       Objective    BP (!) 134/93   Pulse 85   Temp 98.5 F (36.9 C) (Oral)   Resp 16   Wt 234 lb 5.8 oz (106.3 kg)   SpO2 91%   BMI 29.29 kg/m   Physical Exam Vitals and nursing note reviewed.  Constitutional:      General: He is  not in acute distress. Cardiovascular:     Rate and Rhythm: Normal rate and regular rhythm.  Pulmonary:     Effort: Pulmonary effort is normal.     Breath sounds: Normal breath sounds.  Abdominal:     Palpations: Abdomen is soft.     Tenderness: There is no abdominal tenderness.  Neurological:     General: No focal deficit present.     Mental Status: He is alert and oriented to person, place, and time.         Assessment & Plan:   1. Essential hypertension Slightly elevated readings. Continue and monitor    Return in about 3 months (around 12/23/2022) for follow up.   Tommie Raymond, MD

## 2022-09-27 ENCOUNTER — Encounter: Payer: Self-pay | Admitting: General Practice

## 2022-09-27 DIAGNOSIS — Z191 Hormone sensitive malignancy status: Secondary | ICD-10-CM | POA: Diagnosis not present

## 2022-09-27 DIAGNOSIS — C61 Malignant neoplasm of prostate: Secondary | ICD-10-CM | POA: Insufficient documentation

## 2022-09-27 NOTE — Progress Notes (Signed)
[facility] Spiritual Care Note  Followed up with Mr [facility] by phone. He reports that he has settled into understanding that his diagnosis doesn't actually have to derail retirement, which was one of his original fears. In fact, he has already made travel plans for the end of June. He reports appreciation of the care and support he is receiving from his treatment team and looks forward to another Spiritual Care check-in in ca two weeks, after radiation series has begun.  Provided empathic listening, emotional support, and affirmation of strengths, especially his work to process his diagnosis and adjust accordingly.   [address] Rush B[place], [place], [facility] Pager [phone] Voicemail [phone]

## 2022-09-28 ENCOUNTER — Encounter: Payer: Self-pay | Admitting: Family Medicine

## 2022-09-28 DIAGNOSIS — M25562 Pain in left knee: Secondary | ICD-10-CM | POA: Diagnosis not present

## 2022-10-04 ENCOUNTER — Other Ambulatory Visit: Payer: Self-pay

## 2022-10-04 ENCOUNTER — Ambulatory Visit
Admission: RE | Admit: 2022-10-04 | Discharge: 2022-10-04 | Disposition: A | Discharge status: Home / Self Care | Payer: BC Managed Care – PPO | Source: Ambulatory Visit | Attending: Radiation Oncology | Admitting: Radiation Oncology

## 2022-10-04 ENCOUNTER — Ambulatory Visit: Payer: Self-pay | Admitting: Orthopedic Surgery

## 2022-10-04 DIAGNOSIS — C61 Malignant neoplasm of prostate: Secondary | ICD-10-CM | POA: Diagnosis not present

## 2022-10-04 DIAGNOSIS — Z191 Hormone sensitive malignancy status: Secondary | ICD-10-CM | POA: Diagnosis not present

## 2022-10-04 DIAGNOSIS — Z510 Encounter for antineoplastic radiation therapy: Secondary | ICD-10-CM | POA: Diagnosis not present

## 2022-10-04 LAB — RAD ONC ARIA SESSION SUMMARY
Course Elapsed Days: 0
Plan Fractions Treated to Date: 1
Plan Prescribed Dose Per Fraction: 2.5 Gy
Plan Total Fractions Prescribed: 28
Plan Total Prescribed Dose: 70 Gy
Reference Point Dosage Given to Date: 2.5 Gy
Reference Point Session Dosage Given: 2.5 Gy
Session Number: 1

## 2022-10-04 NOTE — Progress Notes (Signed)
RN returned call regarding questions about any prep prior to radiation.   RN left voicemail encouraging patient to come with a full bladder.   Direct number left for call back.

## 2022-10-05 ENCOUNTER — Other Ambulatory Visit: Payer: Self-pay

## 2022-10-05 ENCOUNTER — Ambulatory Visit
Admission: RE | Admit: 2022-10-05 | Discharge: 2022-10-05 | Disposition: A | Discharge status: Home / Self Care | Payer: BC Managed Care – PPO | Source: Ambulatory Visit | Attending: Radiation Oncology | Admitting: Radiation Oncology

## 2022-10-05 DIAGNOSIS — Z191 Hormone sensitive malignancy status: Secondary | ICD-10-CM | POA: Diagnosis not present

## 2022-10-05 DIAGNOSIS — C61 Malignant neoplasm of prostate: Secondary | ICD-10-CM | POA: Diagnosis not present

## 2022-10-05 DIAGNOSIS — Z510 Encounter for antineoplastic radiation therapy: Secondary | ICD-10-CM | POA: Diagnosis not present

## 2022-10-05 LAB — RAD ONC ARIA SESSION SUMMARY
Course Elapsed Days: 1
Plan Fractions Treated to Date: 2
Plan Prescribed Dose Per Fraction: 2.5 Gy
Plan Total Fractions Prescribed: 28
Plan Total Prescribed Dose: 70 Gy
Reference Point Dosage Given to Date: 5 Gy
Reference Point Session Dosage Given: 2.5 Gy
Session Number: 2

## 2022-10-06 ENCOUNTER — Ambulatory Visit
Admission: RE | Admit: 2022-10-06 | Discharge: 2022-10-06 | Disposition: A | Discharge status: Home / Self Care | Payer: BC Managed Care – PPO | Source: Ambulatory Visit | Attending: Radiation Oncology | Admitting: Radiation Oncology

## 2022-10-06 ENCOUNTER — Other Ambulatory Visit: Payer: Self-pay

## 2022-10-06 DIAGNOSIS — Z510 Encounter for antineoplastic radiation therapy: Secondary | ICD-10-CM | POA: Diagnosis not present

## 2022-10-06 DIAGNOSIS — C61 Malignant neoplasm of prostate: Secondary | ICD-10-CM | POA: Diagnosis not present

## 2022-10-06 DIAGNOSIS — Z191 Hormone sensitive malignancy status: Secondary | ICD-10-CM | POA: Diagnosis not present

## 2022-10-06 LAB — RAD ONC ARIA SESSION SUMMARY
Course Elapsed Days: 2
Plan Fractions Treated to Date: 3
Plan Prescribed Dose Per Fraction: 2.5 Gy
Plan Total Fractions Prescribed: 28
Plan Total Prescribed Dose: 70 Gy
Reference Point Dosage Given to Date: 7.5 Gy
Reference Point Session Dosage Given: 2.5 Gy
Session Number: 3

## 2022-10-07 ENCOUNTER — Other Ambulatory Visit: Payer: Self-pay

## 2022-10-07 ENCOUNTER — Ambulatory Visit
Admission: RE | Admit: 2022-10-07 | Discharge: 2022-10-07 | Disposition: A | Discharge status: Home / Self Care | Payer: BC Managed Care – PPO | Source: Ambulatory Visit | Attending: Radiation Oncology | Admitting: Radiation Oncology

## 2022-10-07 DIAGNOSIS — C61 Malignant neoplasm of prostate: Secondary | ICD-10-CM | POA: Diagnosis not present

## 2022-10-07 DIAGNOSIS — Z191 Hormone sensitive malignancy status: Secondary | ICD-10-CM | POA: Diagnosis not present

## 2022-10-07 DIAGNOSIS — Z510 Encounter for antineoplastic radiation therapy: Secondary | ICD-10-CM | POA: Diagnosis not present

## 2022-10-07 LAB — RAD ONC ARIA SESSION SUMMARY
Course Elapsed Days: 3
Plan Fractions Treated to Date: 4
Plan Prescribed Dose Per Fraction: 2.5 Gy
Plan Total Fractions Prescribed: 28
Plan Total Prescribed Dose: 70 Gy
Reference Point Dosage Given to Date: 10 Gy
Reference Point Session Dosage Given: 2.5 Gy
Session Number: 4

## 2022-10-08 ENCOUNTER — Other Ambulatory Visit: Payer: Self-pay

## 2022-10-08 ENCOUNTER — Ambulatory Visit
Admission: RE | Admit: 2022-10-08 | Discharge: 2022-10-08 | Disposition: A | Discharge status: Home / Self Care | Payer: BC Managed Care – PPO | Source: Ambulatory Visit | Attending: Radiation Oncology | Admitting: Radiation Oncology

## 2022-10-08 DIAGNOSIS — Z510 Encounter for antineoplastic radiation therapy: Secondary | ICD-10-CM | POA: Diagnosis not present

## 2022-10-08 DIAGNOSIS — C61 Malignant neoplasm of prostate: Secondary | ICD-10-CM | POA: Diagnosis not present

## 2022-10-08 DIAGNOSIS — Z191 Hormone sensitive malignancy status: Secondary | ICD-10-CM | POA: Diagnosis not present

## 2022-10-08 LAB — RAD ONC ARIA SESSION SUMMARY
Course Elapsed Days: 4
Plan Fractions Treated to Date: 5
Plan Prescribed Dose Per Fraction: 2.5 Gy
Plan Total Fractions Prescribed: 28
Plan Total Prescribed Dose: 70 Gy
Reference Point Dosage Given to Date: 12.5 Gy
Reference Point Session Dosage Given: 2.5 Gy
Session Number: 5

## 2022-10-11 ENCOUNTER — Encounter: Payer: Self-pay | Admitting: General Practice

## 2022-10-11 ENCOUNTER — Other Ambulatory Visit: Payer: Self-pay

## 2022-10-11 ENCOUNTER — Ambulatory Visit
Admission: RE | Admit: 2022-10-11 | Discharge: 2022-10-11 | Disposition: A | Discharge status: Home / Self Care | Payer: BC Managed Care – PPO | Source: Ambulatory Visit | Attending: Radiation Oncology | Admitting: Radiation Oncology

## 2022-10-11 DIAGNOSIS — Z191 Hormone sensitive malignancy status: Secondary | ICD-10-CM | POA: Diagnosis not present

## 2022-10-11 DIAGNOSIS — C61 Malignant neoplasm of prostate: Secondary | ICD-10-CM | POA: Diagnosis not present

## 2022-10-11 DIAGNOSIS — Z510 Encounter for antineoplastic radiation therapy: Secondary | ICD-10-CM | POA: Diagnosis not present

## 2022-10-11 LAB — RAD ONC ARIA SESSION SUMMARY
Course Elapsed Days: 7
Plan Fractions Treated to Date: 6
Plan Prescribed Dose Per Fraction: 2.5 Gy
Plan Total Fractions Prescribed: 28
Plan Total Prescribed Dose: 70 Gy
Reference Point Dosage Given to Date: 15 Gy
Reference Point Session Dosage Given: 2.5 Gy
Session Number: 6

## 2022-10-11 NOTE — Progress Notes (Signed)
[facility] Spiritual Care Note  Followed up with Mr [facility] by phone. He reports good morale, which is helped by being in forward motion with treatment and knowing what to anticipate as radiation progresses. He values periodic check-ins, so we plan to follow up by phone in ca two weeks.   [address] Rush B[place], [place], [facility] Pager [phone] Voicemail [phone]

## 2022-10-12 ENCOUNTER — Other Ambulatory Visit: Payer: Self-pay

## 2022-10-12 ENCOUNTER — Ambulatory Visit
Admission: RE | Admit: 2022-10-12 | Discharge: 2022-10-12 | Disposition: A | Discharge status: Home / Self Care | Payer: BC Managed Care – PPO | Source: Ambulatory Visit | Attending: Radiation Oncology | Admitting: Radiation Oncology

## 2022-10-12 DIAGNOSIS — Z510 Encounter for antineoplastic radiation therapy: Secondary | ICD-10-CM | POA: Diagnosis not present

## 2022-10-12 DIAGNOSIS — Z191 Hormone sensitive malignancy status: Secondary | ICD-10-CM | POA: Diagnosis not present

## 2022-10-12 DIAGNOSIS — C61 Malignant neoplasm of prostate: Secondary | ICD-10-CM | POA: Diagnosis not present

## 2022-10-12 LAB — RAD ONC ARIA SESSION SUMMARY
Course Elapsed Days: 8
Plan Fractions Treated to Date: 7
Plan Prescribed Dose Per Fraction: 2.5 Gy
Plan Total Fractions Prescribed: 28
Plan Total Prescribed Dose: 70 Gy
Reference Point Dosage Given to Date: 17.5 Gy
Reference Point Session Dosage Given: 2.5 Gy
Session Number: 7

## 2022-10-12 NOTE — Progress Notes (Signed)
RN left message to check in since starting radiation treatment.  Direct contact information left for call back.

## 2022-10-13 ENCOUNTER — Other Ambulatory Visit: Payer: Self-pay

## 2022-10-13 ENCOUNTER — Ambulatory Visit
Admission: RE | Admit: 2022-10-13 | Discharge: 2022-10-13 | Disposition: A | Discharge status: Home / Self Care | Payer: BC Managed Care – PPO | Source: Ambulatory Visit | Attending: Radiation Oncology | Admitting: Radiation Oncology

## 2022-10-13 DIAGNOSIS — Z191 Hormone sensitive malignancy status: Secondary | ICD-10-CM | POA: Diagnosis not present

## 2022-10-13 DIAGNOSIS — C61 Malignant neoplasm of prostate: Secondary | ICD-10-CM | POA: Diagnosis not present

## 2022-10-13 DIAGNOSIS — Z510 Encounter for antineoplastic radiation therapy: Secondary | ICD-10-CM | POA: Diagnosis not present

## 2022-10-13 LAB — RAD ONC ARIA SESSION SUMMARY
Course Elapsed Days: 9
Plan Fractions Treated to Date: 8
Plan Prescribed Dose Per Fraction: 2.5 Gy
Plan Total Fractions Prescribed: 28
Plan Total Prescribed Dose: 70 Gy
Reference Point Dosage Given to Date: 20 Gy
Reference Point Session Dosage Given: 2.5 Gy
Session Number: 8

## 2022-10-14 ENCOUNTER — Ambulatory Visit
Admission: RE | Admit: 2022-10-14 | Discharge: 2022-10-14 | Disposition: A | Discharge status: Home / Self Care | Payer: BC Managed Care – PPO | Source: Ambulatory Visit | Attending: Radiation Oncology | Admitting: Radiation Oncology

## 2022-10-14 ENCOUNTER — Other Ambulatory Visit: Payer: Self-pay

## 2022-10-14 DIAGNOSIS — C61 Malignant neoplasm of prostate: Secondary | ICD-10-CM | POA: Diagnosis not present

## 2022-10-14 DIAGNOSIS — Z510 Encounter for antineoplastic radiation therapy: Secondary | ICD-10-CM | POA: Diagnosis not present

## 2022-10-14 DIAGNOSIS — Z191 Hormone sensitive malignancy status: Secondary | ICD-10-CM | POA: Diagnosis not present

## 2022-10-14 LAB — RAD ONC ARIA SESSION SUMMARY
Course Elapsed Days: 10
Plan Fractions Treated to Date: 9
Plan Prescribed Dose Per Fraction: 2.5 Gy
Plan Total Fractions Prescribed: 28
Plan Total Prescribed Dose: 70 Gy
Reference Point Dosage Given to Date: 22.5 Gy
Reference Point Session Dosage Given: 2.5 Gy
Session Number: 9

## 2022-10-15 ENCOUNTER — Other Ambulatory Visit: Payer: Self-pay

## 2022-10-15 ENCOUNTER — Ambulatory Visit
Admission: RE | Admit: 2022-10-15 | Discharge: 2022-10-15 | Disposition: A | Discharge status: Home / Self Care | Payer: BC Managed Care – PPO | Source: Ambulatory Visit | Attending: Radiation Oncology

## 2022-10-15 DIAGNOSIS — Z510 Encounter for antineoplastic radiation therapy: Secondary | ICD-10-CM | POA: Diagnosis not present

## 2022-10-15 DIAGNOSIS — Z191 Hormone sensitive malignancy status: Secondary | ICD-10-CM | POA: Diagnosis not present

## 2022-10-15 DIAGNOSIS — C61 Malignant neoplasm of prostate: Secondary | ICD-10-CM | POA: Diagnosis not present

## 2022-10-15 LAB — RAD ONC ARIA SESSION SUMMARY
Course Elapsed Days: 11
Plan Fractions Treated to Date: 10
Plan Prescribed Dose Per Fraction: 2.5 Gy
Plan Total Fractions Prescribed: 28
Plan Total Prescribed Dose: 70 Gy
Reference Point Dosage Given to Date: 25 Gy
Reference Point Session Dosage Given: 2.5 Gy
Session Number: 10

## 2022-10-19 ENCOUNTER — Other Ambulatory Visit: Payer: Self-pay

## 2022-10-19 ENCOUNTER — Ambulatory Visit
Admission: RE | Admit: 2022-10-19 | Discharge: 2022-10-19 | Disposition: A | Discharge status: Home / Self Care | Payer: BC Managed Care – PPO | Source: Ambulatory Visit | Attending: Radiation Oncology

## 2022-10-19 DIAGNOSIS — Z191 Hormone sensitive malignancy status: Secondary | ICD-10-CM | POA: Diagnosis not present

## 2022-10-19 DIAGNOSIS — Z510 Encounter for antineoplastic radiation therapy: Secondary | ICD-10-CM | POA: Diagnosis not present

## 2022-10-19 DIAGNOSIS — C61 Malignant neoplasm of prostate: Secondary | ICD-10-CM | POA: Diagnosis not present

## 2022-10-19 LAB — RAD ONC ARIA SESSION SUMMARY
Course Elapsed Days: 15
Plan Fractions Treated to Date: 11
Plan Prescribed Dose Per Fraction: 2.5 Gy
Plan Total Fractions Prescribed: 28
Plan Total Prescribed Dose: 70 Gy
Reference Point Dosage Given to Date: 27.5 Gy
Reference Point Session Dosage Given: 2.5 Gy
Session Number: 11

## 2022-10-20 ENCOUNTER — Other Ambulatory Visit: Payer: Self-pay

## 2022-10-20 ENCOUNTER — Ambulatory Visit
Admission: RE | Admit: 2022-10-20 | Discharge: 2022-10-20 | Disposition: A | Discharge status: Home / Self Care | Payer: BC Managed Care – PPO | Source: Ambulatory Visit | Attending: Radiation Oncology

## 2022-10-20 DIAGNOSIS — Z191 Hormone sensitive malignancy status: Secondary | ICD-10-CM | POA: Diagnosis not present

## 2022-10-20 DIAGNOSIS — C61 Malignant neoplasm of prostate: Secondary | ICD-10-CM | POA: Diagnosis not present

## 2022-10-20 DIAGNOSIS — Z510 Encounter for antineoplastic radiation therapy: Secondary | ICD-10-CM | POA: Diagnosis not present

## 2022-10-20 LAB — RAD ONC ARIA SESSION SUMMARY
Course Elapsed Days: 16
Plan Fractions Treated to Date: 12
Plan Prescribed Dose Per Fraction: 2.5 Gy
Plan Total Fractions Prescribed: 28
Plan Total Prescribed Dose: 70 Gy
Reference Point Dosage Given to Date: 30 Gy
Reference Point Session Dosage Given: 2.5 Gy
Session Number: 12

## 2022-10-21 ENCOUNTER — Other Ambulatory Visit: Payer: Self-pay

## 2022-10-21 ENCOUNTER — Ambulatory Visit
Admission: RE | Admit: 2022-10-21 | Discharge: 2022-10-21 | Disposition: A | Discharge status: Home / Self Care | Payer: BC Managed Care – PPO | Source: Ambulatory Visit | Attending: Radiation Oncology | Admitting: Radiation Oncology

## 2022-10-21 DIAGNOSIS — Z510 Encounter for antineoplastic radiation therapy: Secondary | ICD-10-CM | POA: Diagnosis not present

## 2022-10-21 DIAGNOSIS — Z191 Hormone sensitive malignancy status: Secondary | ICD-10-CM | POA: Diagnosis not present

## 2022-10-21 DIAGNOSIS — C61 Malignant neoplasm of prostate: Secondary | ICD-10-CM | POA: Diagnosis not present

## 2022-10-21 LAB — RAD ONC ARIA SESSION SUMMARY
Course Elapsed Days: 17
Plan Fractions Treated to Date: 13
Plan Prescribed Dose Per Fraction: 2.5 Gy
Plan Total Fractions Prescribed: 28
Plan Total Prescribed Dose: 70 Gy
Reference Point Dosage Given to Date: 32.5 Gy
Reference Point Session Dosage Given: 2.5 Gy
Session Number: 13

## 2022-10-22 ENCOUNTER — Ambulatory Visit
Admission: RE | Admit: 2022-10-22 | Discharge: 2022-10-22 | Disposition: A | Discharge status: Home / Self Care | Payer: BC Managed Care – PPO | Source: Ambulatory Visit | Attending: Radiation Oncology

## 2022-10-22 ENCOUNTER — Encounter (HOSPITAL_COMMUNITY): Payer: Self-pay

## 2022-10-22 ENCOUNTER — Other Ambulatory Visit: Payer: Self-pay

## 2022-10-22 ENCOUNTER — Ambulatory Visit
Admission: RE | Admit: 2022-10-22 | Discharge: 2022-10-22 | Disposition: A | Discharge status: Home / Self Care | Payer: BC Managed Care – PPO | Source: Ambulatory Visit | Attending: Radiation Oncology | Admitting: Radiation Oncology

## 2022-10-22 DIAGNOSIS — Z191 Hormone sensitive malignancy status: Secondary | ICD-10-CM | POA: Diagnosis not present

## 2022-10-22 DIAGNOSIS — Z510 Encounter for antineoplastic radiation therapy: Secondary | ICD-10-CM | POA: Diagnosis not present

## 2022-10-22 DIAGNOSIS — C61 Malignant neoplasm of prostate: Secondary | ICD-10-CM | POA: Diagnosis not present

## 2022-10-22 LAB — RAD ONC ARIA SESSION SUMMARY
Course Elapsed Days: 18
Plan Fractions Treated to Date: 14
Plan Prescribed Dose Per Fraction: 2.5 Gy
Plan Total Fractions Prescribed: 28
Plan Total Prescribed Dose: 70 Gy
Reference Point Dosage Given to Date: 35 Gy
Reference Point Session Dosage Given: 2.5 Gy
Session Number: 14

## 2022-10-25 ENCOUNTER — Encounter: Payer: Self-pay | Admitting: General Practice

## 2022-10-25 ENCOUNTER — Other Ambulatory Visit: Payer: Self-pay

## 2022-10-25 ENCOUNTER — Ambulatory Visit
Admission: RE | Admit: 2022-10-25 | Discharge: 2022-10-25 | Disposition: A | Discharge status: Home / Self Care | Payer: BC Managed Care – PPO | Source: Ambulatory Visit | Attending: Radiation Oncology | Admitting: Radiation Oncology

## 2022-10-25 DIAGNOSIS — Z191 Hormone sensitive malignancy status: Secondary | ICD-10-CM | POA: Diagnosis not present

## 2022-10-25 DIAGNOSIS — C61 Malignant neoplasm of prostate: Secondary | ICD-10-CM | POA: Insufficient documentation

## 2022-10-25 DIAGNOSIS — Z510 Encounter for antineoplastic radiation therapy: Secondary | ICD-10-CM | POA: Diagnosis not present

## 2022-10-25 LAB — RAD ONC ARIA SESSION SUMMARY
Course Elapsed Days: 21
Plan Fractions Treated to Date: 15
Plan Prescribed Dose Per Fraction: 2.5 Gy
Plan Total Fractions Prescribed: 28
Plan Total Prescribed Dose: 70 Gy
Reference Point Dosage Given to Date: 37.5 Gy
Reference Point Session Dosage Given: 2.5 Gy
Session Number: 15

## 2022-10-25 NOTE — Progress Notes (Signed)
[facility] Spiritual Care Note  Followed up with Mr [facility] by phone. He reports that he is doing well with radiation so far and is relieved to be halfway finished. He plans to phone back when he has more time to speak in detail.   [address] Rush Barer, [place], [facility] Pager [phone] Voicemail [phone]

## 2022-10-26 ENCOUNTER — Ambulatory Visit
Admission: RE | Admit: 2022-10-26 | Discharge: 2022-10-26 | Disposition: A | Discharge status: Home / Self Care | Payer: BC Managed Care – PPO | Source: Ambulatory Visit | Attending: Radiation Oncology | Admitting: Radiation Oncology

## 2022-10-26 ENCOUNTER — Other Ambulatory Visit: Payer: Self-pay

## 2022-10-26 DIAGNOSIS — C61 Malignant neoplasm of prostate: Secondary | ICD-10-CM | POA: Diagnosis not present

## 2022-10-26 DIAGNOSIS — Z510 Encounter for antineoplastic radiation therapy: Secondary | ICD-10-CM | POA: Diagnosis not present

## 2022-10-26 DIAGNOSIS — Z191 Hormone sensitive malignancy status: Secondary | ICD-10-CM | POA: Diagnosis not present

## 2022-10-26 LAB — RAD ONC ARIA SESSION SUMMARY
Course Elapsed Days: 22
Plan Fractions Treated to Date: 16
Plan Prescribed Dose Per Fraction: 2.5 Gy
Plan Total Fractions Prescribed: 28
Plan Total Prescribed Dose: 70 Gy
Reference Point Dosage Given to Date: 40 Gy
Reference Point Session Dosage Given: 2.5 Gy
Session Number: 16

## 2022-10-27 ENCOUNTER — Other Ambulatory Visit: Payer: Self-pay

## 2022-10-27 ENCOUNTER — Ambulatory Visit
Admission: RE | Admit: 2022-10-27 | Discharge: 2022-10-27 | Disposition: A | Discharge status: Home / Self Care | Payer: BC Managed Care – PPO | Source: Ambulatory Visit | Attending: Radiation Oncology | Admitting: Radiation Oncology

## 2022-10-27 DIAGNOSIS — Z510 Encounter for antineoplastic radiation therapy: Secondary | ICD-10-CM | POA: Diagnosis not present

## 2022-10-27 DIAGNOSIS — Z191 Hormone sensitive malignancy status: Secondary | ICD-10-CM | POA: Diagnosis not present

## 2022-10-27 DIAGNOSIS — C61 Malignant neoplasm of prostate: Secondary | ICD-10-CM | POA: Diagnosis not present

## 2022-10-27 LAB — RAD ONC ARIA SESSION SUMMARY
Course Elapsed Days: 23
Plan Fractions Treated to Date: 17
Plan Prescribed Dose Per Fraction: 2.5 Gy
Plan Total Fractions Prescribed: 28
Plan Total Prescribed Dose: 70 Gy
Reference Point Dosage Given to Date: 42.5 Gy
Reference Point Session Dosage Given: 2.5 Gy
Session Number: 17

## 2022-10-28 ENCOUNTER — Other Ambulatory Visit: Payer: Self-pay

## 2022-10-28 ENCOUNTER — Ambulatory Visit
Admission: RE | Admit: 2022-10-28 | Discharge: 2022-10-28 | Disposition: A | Discharge status: Home / Self Care | Payer: BC Managed Care – PPO | Source: Ambulatory Visit | Attending: Radiation Oncology | Admitting: Radiation Oncology

## 2022-10-28 DIAGNOSIS — C61 Malignant neoplasm of prostate: Secondary | ICD-10-CM | POA: Diagnosis not present

## 2022-10-28 DIAGNOSIS — Z191 Hormone sensitive malignancy status: Secondary | ICD-10-CM | POA: Diagnosis not present

## 2022-10-28 DIAGNOSIS — Z510 Encounter for antineoplastic radiation therapy: Secondary | ICD-10-CM | POA: Diagnosis not present

## 2022-10-28 LAB — RAD ONC ARIA SESSION SUMMARY
Course Elapsed Days: 24
Plan Fractions Treated to Date: 18
Plan Prescribed Dose Per Fraction: 2.5 Gy
Plan Total Fractions Prescribed: 28
Plan Total Prescribed Dose: 70 Gy
Reference Point Dosage Given to Date: 45 Gy
Reference Point Session Dosage Given: 2.5 Gy
Session Number: 18

## 2022-10-29 ENCOUNTER — Ambulatory Visit
Admission: RE | Admit: 2022-10-29 | Discharge: 2022-10-29 | Disposition: A | Discharge status: Home / Self Care | Payer: BC Managed Care – PPO | Source: Ambulatory Visit | Attending: Radiation Oncology | Admitting: Radiation Oncology

## 2022-10-29 ENCOUNTER — Other Ambulatory Visit: Payer: Self-pay

## 2022-10-29 DIAGNOSIS — Z510 Encounter for antineoplastic radiation therapy: Secondary | ICD-10-CM | POA: Diagnosis not present

## 2022-10-29 DIAGNOSIS — Z191 Hormone sensitive malignancy status: Secondary | ICD-10-CM | POA: Diagnosis not present

## 2022-10-29 DIAGNOSIS — C61 Malignant neoplasm of prostate: Secondary | ICD-10-CM | POA: Diagnosis not present

## 2022-10-29 LAB — RAD ONC ARIA SESSION SUMMARY
Course Elapsed Days: 25
Plan Fractions Treated to Date: 19
Plan Prescribed Dose Per Fraction: 2.5 Gy
Plan Total Fractions Prescribed: 28
Plan Total Prescribed Dose: 70 Gy
Reference Point Dosage Given to Date: 47.5 Gy
Reference Point Session Dosage Given: 2.5 Gy
Session Number: 19

## 2022-11-01 ENCOUNTER — Other Ambulatory Visit: Payer: Self-pay

## 2022-11-01 ENCOUNTER — Ambulatory Visit
Admission: RE | Admit: 2022-11-01 | Discharge: 2022-11-01 | Disposition: A | Discharge status: Home / Self Care | Payer: BC Managed Care – PPO | Source: Ambulatory Visit | Attending: Radiation Oncology | Admitting: Radiation Oncology

## 2022-11-01 DIAGNOSIS — Z510 Encounter for antineoplastic radiation therapy: Secondary | ICD-10-CM | POA: Diagnosis not present

## 2022-11-01 DIAGNOSIS — Z191 Hormone sensitive malignancy status: Secondary | ICD-10-CM | POA: Diagnosis not present

## 2022-11-01 DIAGNOSIS — C61 Malignant neoplasm of prostate: Secondary | ICD-10-CM | POA: Diagnosis not present

## 2022-11-01 LAB — RAD ONC ARIA SESSION SUMMARY
Course Elapsed Days: 28
Plan Fractions Treated to Date: 20
Plan Prescribed Dose Per Fraction: 2.5 Gy
Plan Total Fractions Prescribed: 28
Plan Total Prescribed Dose: 70 Gy
Reference Point Dosage Given to Date: 50 Gy
Reference Point Session Dosage Given: 2.5 Gy
Session Number: 20

## 2022-11-02 ENCOUNTER — Other Ambulatory Visit: Payer: Self-pay

## 2022-11-02 ENCOUNTER — Ambulatory Visit
Admission: RE | Admit: 2022-11-02 | Discharge: 2022-11-02 | Disposition: A | Discharge status: Home / Self Care | Payer: BC Managed Care – PPO | Source: Ambulatory Visit | Attending: Radiation Oncology

## 2022-11-02 DIAGNOSIS — Z510 Encounter for antineoplastic radiation therapy: Secondary | ICD-10-CM | POA: Diagnosis not present

## 2022-11-02 DIAGNOSIS — C61 Malignant neoplasm of prostate: Secondary | ICD-10-CM | POA: Diagnosis not present

## 2022-11-02 DIAGNOSIS — Z191 Hormone sensitive malignancy status: Secondary | ICD-10-CM | POA: Diagnosis not present

## 2022-11-02 LAB — RAD ONC ARIA SESSION SUMMARY
Course Elapsed Days: 29
Plan Fractions Treated to Date: 21
Plan Prescribed Dose Per Fraction: 2.5 Gy
Plan Total Fractions Prescribed: 28
Plan Total Prescribed Dose: 70 Gy
Reference Point Dosage Given to Date: 52.5 Gy
Reference Point Session Dosage Given: 2.5 Gy
Session Number: 21

## 2022-11-03 ENCOUNTER — Other Ambulatory Visit: Payer: Self-pay

## 2022-11-03 ENCOUNTER — Ambulatory Visit
Admission: RE | Admit: 2022-11-03 | Discharge: 2022-11-03 | Disposition: A | Discharge status: Home / Self Care | Payer: BC Managed Care – PPO | Source: Ambulatory Visit | Attending: Radiation Oncology | Admitting: Radiation Oncology

## 2022-11-03 DIAGNOSIS — C61 Malignant neoplasm of prostate: Secondary | ICD-10-CM | POA: Diagnosis not present

## 2022-11-03 DIAGNOSIS — Z191 Hormone sensitive malignancy status: Secondary | ICD-10-CM | POA: Diagnosis not present

## 2022-11-03 DIAGNOSIS — Z510 Encounter for antineoplastic radiation therapy: Secondary | ICD-10-CM | POA: Diagnosis not present

## 2022-11-03 LAB — RAD ONC ARIA SESSION SUMMARY
Course Elapsed Days: 30
Plan Fractions Treated to Date: 22
Plan Prescribed Dose Per Fraction: 2.5 Gy
Plan Total Fractions Prescribed: 28
Plan Total Prescribed Dose: 70 Gy
Reference Point Dosage Given to Date: 55 Gy
Reference Point Session Dosage Given: 2.5 Gy
Session Number: 22

## 2022-11-03 NOTE — Patient Instructions (Addendum)
SURGICAL WAITING ROOM VISITATION  Patients having surgery or a procedure may have no more than 2 support people in the waiting area - these visitors may rotate.    Children under the age of 67 must have an adult with them who is not the patient.  Due to an increase in RSV and influenza rates and associated hospitalizations, children ages 33 and under may not visit patients in [facility] hospitals.  If the patient needs to stay at the hospital during part of their recovery, the visitor guidelines for inpatient rooms apply. Pre-op nurse will coordinate an appropriate time for 1 support person to accompany patient in pre-op.  This support person may not rotate.    Please refer to the [facility] website for the visitor guidelines for Inpatients (after your surgery is over and you are in a regular room).       Your procedure is scheduled on:  11/17/22    Report to [facility] Main Entrance    Report to admitting at   [phone]   Call this number if you have problems the morning of surgery [phone]   Do not eat food :After Midnight.   After Midnight you may have the following liquids until __ 0430____ AM/ PM DAY OF SURGERY  Water Non-Citrus Juices (without pulp, NO RED-Apple, White grape, White cranberry) Black Coffee (NO MILK/CREAM OR CREAMERS, sugar ok)  Clear Tea (NO MILK/CREAM OR CREAMERS, sugar ok) regular and decaf                             Plain Jell-O (NO RED)                                           Fruit ices (not with fruit pulp, NO RED)                                     Popsicles (NO RED)                                                               Sports drinks like Gatorade (NO RED)                    The day of surgery:  Drink ONE (1) Pre-Surgery Clear Ensure or G2 at  0430AM  ( have completed by ) the morning of surgery. Drink in one sitting. Do not sip.  This drink was given to you during your hospital  pre-op appointment visit. Nothing else to  drink after completing the  Pre-Surgery Clear Ensure or G2.          If you have questions, please contact your surgeon's office.     Oral Hygiene is also important to reduce your risk of infection.                                    Remember - BRUSH YOUR TEETH THE MORNING OF SURGERY WITH YOUR REGULAR  TOOTHPASTE  DENTURES WILL BE REMOVED PRIOR TO SURGERY PLEASE DO NOT APPLY "Poly grip" OR ADHESIVES!!!   Do NOT smoke after Midnight   Take these medicines the morning of surgery with A SIP OF WATER:  amlodipine   DO NOT TAKE ANY ORAL DIABETIC MEDICATIONS DAY OF YOUR SURGERY  Bring CPAP mask and tubing day of surgery.                              You may not have any metal on your body including hair pins, jewelry, and body piercing             Do not wear make-up, lotions, powders, perfumes/cologne, or deodorant  Do not wear nail polish including gel and S&S, artificial/acrylic nails, or any other type of covering on natural nails including finger and toenails. If you have artificial nails, gel coating, etc. that needs to be removed by a nail salon please have this removed prior to surgery or surgery may need to be canceled/ delayed if the surgeon/ anesthesia feels like they are unable to be safely monitored.   Do not shave  48 hours prior to surgery.               Men may shave face and neck.   Do not bring valuables to the hospital. [facility] IS NOT             RESPONSIBLE   FOR VALUABLES.   Contacts, glasses, dentures or bridgework may not be worn into surgery.   Bring small overnight bag day of surgery.   DO NOT BRING YOUR HOME MEDICATIONS TO THE HOSPITAL. PHARMACY WILL DISPENSE MEDICATIONS LISTED ON YOUR MEDICATION LIST TO YOU DURING YOUR ADMISSION IN THE HOSPITAL!    Patients discharged on the day of surgery will not be allowed to drive home.  Someone NEEDS to stay with you for the first 24 hours after anesthesia.   Special Instructions: Bring a copy of your healthcare  power of attorney and living will documents the day of surgery if you haven't scanned them before.              Please read over the following fact sheets you were given: IF YOU HAVE QUESTIONS ABOUT YOUR PRE-OP INSTRUCTIONS PLEASE CALL [phone]   If you received a COVID test during your pre-op visit  it is requested that you wear a mask when out in public, stay away from anyone that may not be feeling well and notify your surgeon if you develop symptoms. If you test positive for Covid or have been in contact with anyone that has tested positive in the last 10 days please notify you surgeon.      Pre-operative 5 CHG Bath Instructions   You can play a key role in reducing the risk of infection after surgery. Your skin needs to be as free of germs as possible. You can reduce the number of germs on your skin by washing with CHG (chlorhexidine gluconate) soap before surgery. CHG is an antiseptic soap that kills germs and continues to kill germs even after washing.   DO NOT use if you have an allergy to chlorhexidine/CHG or antibacterial soaps. If your skin becomes reddened or irritated, stop using the CHG and notify one of our RNs at [phone].   Please shower with the CHG soap starting 4 days before surgery using the following schedule:     Please keep  in mind the following:  DO NOT shave, including legs and underarms, starting the day of your first shower.   You may shave your face at any point before/day of surgery.  Place clean sheets on your bed the day you start using CHG soap. Use a clean washcloth (not used since being washed) for each shower. DO NOT sleep with pets once you start using the CHG.   CHG Shower Instructions:  If you choose to wash your hair and private area, wash first with your normal shampoo/soap.  After you use shampoo/soap, rinse your hair and body thoroughly to remove shampoo/soap residue.  Turn the water OFF and apply about 3 tablespoons (45 ml) of CHG soap  to a CLEAN washcloth.  Apply CHG soap ONLY FROM YOUR NECK DOWN TO YOUR TOES (washing for 3-5 minutes)  DO NOT use CHG soap on face, private areas, open wounds, or sores.  Pay special attention to the area where your surgery is being performed.  If you are having back surgery, having someone wash your back for you may be helpful. Wait 2 minutes after CHG soap is applied, then you may rinse off the CHG soap.  Pat dry with a clean towel  Put on clean clothes/pajamas   If you choose to wear lotion, please use ONLY the CHG-compatible lotions on the back of this paper.     Additional instructions for the day of surgery: DO NOT APPLY any lotions, deodorants, cologne, or perfumes.   Put on clean/comfortable clothes.  Brush your teeth.  Ask your nurse before applying any prescription medications to the skin.      CHG Compatible Lotions   Aveeno Moisturizing lotion  Cetaphil Moisturizing Cream  Cetaphil Moisturizing Lotion  Clairol Herbal Essence Moisturizing Lotion, Dry Skin  Clairol Herbal Essence Moisturizing Lotion, Extra Dry Skin  Clairol Herbal Essence Moisturizing Lotion, Normal Skin  Curel Age Defying Therapeutic Moisturizing Lotion with Alpha Hydroxy  Curel Extreme Care Body Lotion  Curel Soothing Hands Moisturizing Hand Lotion  Curel Therapeutic Moisturizing Cream, Fragrance-Free  Curel Therapeutic Moisturizing Lotion, Fragrance-Free  Curel Therapeutic Moisturizing Lotion, Original Formula  Eucerin Daily Replenishing Lotion  Eucerin Dry Skin Therapy Plus Alpha Hydroxy Crme  Eucerin Dry Skin Therapy Plus Alpha Hydroxy Lotion  Eucerin Original Crme  Eucerin Original Lotion  Eucerin Plus Crme Eucerin Plus Lotion  Eucerin TriLipid Replenishing Lotion  Keri Anti-Bacterial Hand Lotion  Keri Deep Conditioning Original Lotion Dry Skin Formula Softly Scented  Keri Deep Conditioning Original Lotion, Fragrance Free Sensitive Skin Formula  Keri Lotion Fast Absorbing Fragrance Free  Sensitive Skin Formula  Keri Lotion Fast Absorbing Softly Scented Dry Skin Formula  Keri Original Lotion  Keri Skin Renewal Lotion Keri Silky Smooth Lotion  Keri Silky Smooth Sensitive Skin Lotion  Nivea Body Creamy Conditioning Oil  Nivea Body Extra Enriched Teacher, adult education Moisturizing Lotion Nivea Crme  Nivea Skin Firming Lotion  NutraDerm 30 Skin Lotion  NutraDerm Skin Lotion  NutraDerm Therapeutic Skin Cream  NutraDerm Therapeutic Skin Lotion  ProShield Protective Hand Cream  Provon moisturizing lotion

## 2022-11-03 NOTE — Progress Notes (Addendum)
Anesthesia Review:  [id number] Vincent Ray clearance on chart dated 09/30/22  LOV 09/22/22  Cardiologist : none  Chest x-ray : EKG : 11/09/22  Echo : 2017  Stress test: Cardiac Cath :  Activity level: can do a flight of stairs without difficutly  Sleep Study/ CPAP : none  Fasting Blood Sugar :      / Checks Blood Sugar -- times a day:   Blood Thinner/ Instructions /Last Dose: ASA / Instructions/ Last Dose :    Radiation treatment ( IMRT)  - Currenlty receiving for prostate cancer.  Final treatment on 11/11/22.

## 2022-11-04 ENCOUNTER — Other Ambulatory Visit: Payer: Self-pay

## 2022-11-04 ENCOUNTER — Ambulatory Visit: Payer: BC Managed Care – PPO

## 2022-11-04 ENCOUNTER — Ambulatory Visit
Admission: RE | Admit: 2022-11-04 | Discharge: 2022-11-04 | Disposition: A | Discharge status: Home / Self Care | Payer: BC Managed Care – PPO | Source: Ambulatory Visit | Attending: Radiation Oncology | Admitting: Radiation Oncology

## 2022-11-04 ENCOUNTER — Other Ambulatory Visit: Payer: Self-pay | Admitting: Radiation Oncology

## 2022-11-04 DIAGNOSIS — Z191 Hormone sensitive malignancy status: Secondary | ICD-10-CM | POA: Diagnosis not present

## 2022-11-04 DIAGNOSIS — Z510 Encounter for antineoplastic radiation therapy: Secondary | ICD-10-CM | POA: Diagnosis not present

## 2022-11-04 DIAGNOSIS — C61 Malignant neoplasm of prostate: Secondary | ICD-10-CM | POA: Diagnosis not present

## 2022-11-04 LAB — RAD ONC ARIA SESSION SUMMARY
Course Elapsed Days: 31
Plan Fractions Treated to Date: 23
Plan Prescribed Dose Per Fraction: 2.5 Gy
Plan Total Fractions Prescribed: 28
Plan Total Prescribed Dose: 70 Gy
Reference Point Dosage Given to Date: 57.5 Gy
Reference Point Session Dosage Given: 2.5 Gy
Session Number: 23

## 2022-11-04 MED ORDER — PHENAZOPYRIDINE HCL 200 MG PO TABS: 200.0000 mg | ORAL_TABLET | Freq: Three times a day (TID) | ORAL | 3 refills | Status: DC | PRN

## 2022-11-05 ENCOUNTER — Ambulatory Visit
Admission: RE | Admit: 2022-11-05 | Discharge: 2022-11-05 | Disposition: A | Discharge status: Home / Self Care | Payer: BC Managed Care – PPO | Source: Ambulatory Visit | Attending: Radiation Oncology

## 2022-11-05 ENCOUNTER — Other Ambulatory Visit: Payer: Self-pay

## 2022-11-05 DIAGNOSIS — Z510 Encounter for antineoplastic radiation therapy: Secondary | ICD-10-CM | POA: Diagnosis not present

## 2022-11-05 DIAGNOSIS — C61 Malignant neoplasm of prostate: Secondary | ICD-10-CM | POA: Diagnosis not present

## 2022-11-05 DIAGNOSIS — Z191 Hormone sensitive malignancy status: Secondary | ICD-10-CM | POA: Diagnosis not present

## 2022-11-05 LAB — RAD ONC ARIA SESSION SUMMARY
Course Elapsed Days: 32
Plan Fractions Treated to Date: 24
Plan Prescribed Dose Per Fraction: 2.5 Gy
Plan Total Fractions Prescribed: 28
Plan Total Prescribed Dose: 70 Gy
Reference Point Dosage Given to Date: 60 Gy
Reference Point Session Dosage Given: 2.5 Gy
Session Number: 24

## 2022-11-08 ENCOUNTER — Other Ambulatory Visit: Payer: Self-pay

## 2022-11-08 ENCOUNTER — Inpatient Hospital Stay (HOSPITAL_COMMUNITY): Admission: RE | Admit: 2022-11-08 | Payer: BLUE CROSS/BLUE SHIELD | Source: Ambulatory Visit

## 2022-11-08 ENCOUNTER — Ambulatory Visit
Admission: RE | Admit: 2022-11-08 | Discharge: 2022-11-08 | Disposition: A | Discharge status: Home / Self Care | Payer: BC Managed Care – PPO | Source: Ambulatory Visit | Attending: Radiation Oncology

## 2022-11-08 ENCOUNTER — Other Ambulatory Visit (HOSPITAL_COMMUNITY): Payer: BC Managed Care – PPO

## 2022-11-08 DIAGNOSIS — C61 Malignant neoplasm of prostate: Secondary | ICD-10-CM | POA: Diagnosis not present

## 2022-11-08 DIAGNOSIS — Z191 Hormone sensitive malignancy status: Secondary | ICD-10-CM | POA: Diagnosis not present

## 2022-11-08 DIAGNOSIS — Z510 Encounter for antineoplastic radiation therapy: Secondary | ICD-10-CM | POA: Diagnosis not present

## 2022-11-08 LAB — RAD ONC ARIA SESSION SUMMARY
Course Elapsed Days: 35
Plan Fractions Treated to Date: 25
Plan Prescribed Dose Per Fraction: 2.5 Gy
Plan Total Fractions Prescribed: 28
Plan Total Prescribed Dose: 70 Gy
Reference Point Dosage Given to Date: 62.5 Gy
Reference Point Session Dosage Given: 2.5 Gy
Session Number: 25

## 2022-11-09 ENCOUNTER — Ambulatory Visit
Admission: RE | Admit: 2022-11-09 | Discharge: 2022-11-09 | Disposition: A | Discharge status: Home / Self Care | Payer: BC Managed Care – PPO | Source: Ambulatory Visit | Attending: Radiation Oncology | Admitting: Radiation Oncology

## 2022-11-09 ENCOUNTER — Other Ambulatory Visit: Payer: Self-pay

## 2022-11-09 ENCOUNTER — Encounter (HOSPITAL_COMMUNITY)
Admission: RE | Admit: 2022-11-09 | Discharge: 2022-11-09 | Disposition: A | Discharge status: Home / Self Care | Payer: BC Managed Care – PPO | Source: Ambulatory Visit | Attending: Specialist | Admitting: Specialist

## 2022-11-09 ENCOUNTER — Encounter (HOSPITAL_COMMUNITY): Payer: Self-pay

## 2022-11-09 VITALS — BP 134/91 | HR 90 | Temp 98.3°F | Resp 16 | Ht 75.0 in | Wt 235.0 lb

## 2022-11-09 DIAGNOSIS — C61 Malignant neoplasm of prostate: Secondary | ICD-10-CM | POA: Diagnosis not present

## 2022-11-09 DIAGNOSIS — Z01818 Encounter for other preprocedural examination: Secondary | ICD-10-CM | POA: Insufficient documentation

## 2022-11-09 DIAGNOSIS — Z510 Encounter for antineoplastic radiation therapy: Secondary | ICD-10-CM | POA: Diagnosis not present

## 2022-11-09 DIAGNOSIS — Z191 Hormone sensitive malignancy status: Secondary | ICD-10-CM | POA: Diagnosis not present

## 2022-11-09 HISTORY — DX: Essential (primary) hypertension: I10

## 2022-11-09 LAB — BASIC METABOLIC PANEL
Anion gap: 8 (ref 5–15)
BUN: 13 mg/dL (ref 8–23)
CO2: 23 mmol/L (ref 22–32)
Calcium: 8.9 mg/dL (ref 8.9–10.3)
Chloride: 107 mmol/L (ref 98–111)
Creatinine, Ser: 1.34 mg/dL — ABNORMAL HIGH (ref 0.61–1.24)
GFR, Estimated: 59 mL/min — ABNORMAL LOW (ref >60)
Glucose, Bld: 99 mg/dL (ref 70–99)
Potassium: 3.8 mmol/L (ref 3.5–5.1)
Sodium: 138 mmol/L (ref 135–145)

## 2022-11-09 LAB — SURGICAL PCR SCREEN
MRSA, PCR: NEGATIVE
Staphylococcus aureus: NEGATIVE

## 2022-11-09 LAB — RAD ONC ARIA SESSION SUMMARY
Course Elapsed Days: 36
Plan Fractions Treated to Date: 26
Plan Prescribed Dose Per Fraction: 2.5 Gy
Plan Total Fractions Prescribed: 28
Plan Total Prescribed Dose: 70 Gy
Reference Point Dosage Given to Date: 65 Gy
Reference Point Session Dosage Given: 2.5 Gy
Session Number: 26

## 2022-11-09 LAB — CBC
HCT: 45.2 % (ref 39.0–52.0)
Hemoglobin: 15.3 g/dL (ref 13.0–17.0)
MCH: 30.7 pg (ref 26.0–34.0)
MCHC: 33.8 g/dL (ref 30.0–36.0)
MCV: 90.6 fL (ref 80.0–100.0)
Platelets: 210 10*3/uL (ref 150–400)
RBC: 4.99 MIL/uL (ref 4.22–5.81)
RDW: 13.9 % (ref 11.5–15.5)
WBC: 3.4 10*3/uL — ABNORMAL LOW (ref 4.0–10.5)
nRBC: 0 % (ref 0.0–0.2)

## 2022-11-10 ENCOUNTER — Other Ambulatory Visit: Payer: Self-pay

## 2022-11-10 ENCOUNTER — Ambulatory Visit
Admission: RE | Admit: 2022-11-10 | Discharge: 2022-11-10 | Disposition: A | Discharge status: Home / Self Care | Payer: BC Managed Care – PPO | Source: Ambulatory Visit | Attending: Radiation Oncology

## 2022-11-10 ENCOUNTER — Ambulatory Visit: Payer: Self-pay | Admitting: Orthopedic Surgery

## 2022-11-10 DIAGNOSIS — Z510 Encounter for antineoplastic radiation therapy: Secondary | ICD-10-CM | POA: Diagnosis not present

## 2022-11-10 DIAGNOSIS — C61 Malignant neoplasm of prostate: Secondary | ICD-10-CM | POA: Diagnosis not present

## 2022-11-10 DIAGNOSIS — Z191 Hormone sensitive malignancy status: Secondary | ICD-10-CM | POA: Diagnosis not present

## 2022-11-10 LAB — RAD ONC ARIA SESSION SUMMARY
Course Elapsed Days: 37
Plan Fractions Treated to Date: 27
Plan Prescribed Dose Per Fraction: 2.5 Gy
Plan Total Fractions Prescribed: 28
Plan Total Prescribed Dose: 70 Gy
Reference Point Dosage Given to Date: 67.5 Gy
Reference Point Session Dosage Given: 2.5 Gy
Session Number: 27

## 2022-11-10 NOTE — H&P (View-Only) (Signed)
Vincent Ray is an 66 y.o. male.   Chief Complaint: left knee pain HPI: H&P left TKA scheduled 11/17/22 @ [place].  Dr. Beane and the patient mutually agreed to proceed with a total knee replacement. Risks and benefits of the procedure were discussed including stiffness, suboptimal range of motion, persistent pain, infection requiring removal of prosthesis and reinsertion, need for prophylactic antibiotics in the future, for example, dental procedures, possible need for manipulation, revision in the future and also anesthetic complications including DVT, PE, etc. We discussed the perioperative course, time in the hospital, postoperative recovery and the need for elevation to control swelling. We also discussed the predicted range of motion and the probability that squatting and kneeling would be unobtainable in the future. In addition, postoperative anticoagulation was discussed. We have obtained preoperative medical clearance as necessary. Provided illustrated handout and discussed it in detail. They will enroll in the total joint replacement educational forum at the hospital.  He has been cleared. He is scheduled for his preop appointment at the hospital tomorrow.  Past Medical History:  Diagnosis Date   Arthritis    Asthma    As a child no problems now   Hallux rigidus of right foot    Hypertension    prostate cancer    Wears glasses     Past Surgical History:  Procedure Laterality Date   CALCANEAL OSTEOTOMY Right 05/04/2018   Procedure: MEDIAL CALCANEAL OSTEOTOMY;  Surgeon: Hewitt, John, MD;  Location: [facility];  Service: Orthopedics;  Laterality: Right;   GASTROC RECESSION EXTREMITY Right 05/04/2018   Procedure: RIGHT GASTROC RECESSION;  Surgeon: Hewitt, John, MD;  Location: [facility];  Service: Orthopedics;  Laterality: Right;   GOLD SEED IMPLANT N/A 09/15/2022   Procedure: GOLD SEED IMPLANT;  Surgeon: Bell, Eugene D III, MD;  Location:  [facility];  Service: Urology;  Laterality: N/A;   HARDWARE REMOVAL Right 05/04/2018   Procedure: MEDIAL MALLEOLUS HARDWARE REMOVAL;  Surgeon: Hewitt, John, MD;  Location: [facility];  Service: Orthopedics;  Laterality: Right;   KNEE SURGERY     arthroscopy right knee   ORIF ANKLE FRACTURE Right 11/08/2014   Procedure: OPEN REDUCTION INTERNAL FIXATION (ORIF) RIGHT ANKLE ;  Surgeon: Jeffrey Beane, MD;  Location: [facility];  Service: Orthopedics;  Laterality: Right;   SPACE OAR INSTILLATION N/A 09/15/2022   Procedure: SPACE OAR INSTILLATION;  Surgeon: Bell, Eugene D III, MD;  Location: [facility];  Service: Urology;  Laterality: N/A;   TOTAL KNEE ARTHROPLASTY Right 04/23/2016   Procedure: RIGHT TOTAL KNEE ARTHROPLASTY;  Surgeon: Jeffrey Beane, MD;  Location: [facility];  Service: Orthopedics;  Laterality: Right;    Family History  Problem Relation Age of Onset   Dementia Mother    Colon cancer Neg Hx    Social History:  reports that he has never smoked. He has never been exposed to tobacco smoke. He has never used smokeless tobacco. He reports current alcohol use. He reports that he does not use drugs.  Allergies: No Known Allergies  Current meds: amLODIPine 10 mg tablet HYDROcodone 5 mg-acetaminophen 325 mg tablet levoFLOXacin 750 mg tablet phenazopyridine 200 mg tablet   Results for orders placed or performed during the hospital encounter of 11/09/22 (from the past 48 hour(s))  Surgical pcr screen     Status: None   Collection Time: 11/09/22 10:16 AM   Specimen: Nasal Mucosa; Nasal Swab  Result Value Ref Range   MRSA, PCR   NEGATIVE NEGATIVE   Staphylococcus aureus NEGATIVE NEGATIVE    Comment: (NOTE) The Xpert SA Assay (FDA approved for NASAL specimens in patients 22 years of age and older), is one component of a comprehensive surveillance program. It is not intended to diagnose infection nor to guide or monitor treatment. Performed at  [facility], [address] [place], [place] [address]   Basic metabolic panel per protocol     Status: Abnormal   Collection Time: 11/09/22 10:38 AM  Result Value Ref Range   Sodium 138 135 - 145 mmol/L   Potassium 3.8 3.5 - 5.1 mmol/L   Chloride 107 98 - 111 mmol/L   CO2 23 22 - 32 mmol/L   Glucose, Bld 99 70 - 99 mg/dL    Comment: Glucose reference range applies only to samples taken after fasting for at least 8 hours.   BUN 13 8 - 23 mg/dL   Creatinine, Ser 1.34 (H) 0.61 - 1.24 mg/dL   Calcium 8.9 8.9 - 10.3 mg/dL   GFR, Estimated 59 (L) >60 mL/min    Comment: (NOTE) Calculated using the CKD-EPI Creatinine Equation (2021)    Anion gap 8 5 - 15    Comment: Performed at [facility], [address] [place], [place] [address]  CBC per protocol     Status: Abnormal   Collection Time: 11/09/22 10:38 AM  Result Value Ref Range   WBC 3.4 (L) 4.0 - 10.5 K/uL   RBC 4.99 4.22 - 5.81 MIL/uL   Hemoglobin 15.3 13.0 - 17.0 g/dL   HCT 45.2 39.0 - 52.0 %   MCV 90.6 80.0 - 100.0 fL   MCH 30.7 26.0 - 34.0 pg   MCHC 33.8 30.0 - 36.0 g/dL   RDW 13.9 11.5 - 15.5 %   Platelets 210 150 - 400 K/uL   nRBC 0.0 0.0 - 0.2 %    Comment: Performed at [facility], [address] [place],  [address]   No results found.  Review of Systems  Constitutional: Negative.   HENT: Negative.    Eyes: Negative.   Respiratory: Negative.    Cardiovascular: Negative.   Gastrointestinal: Negative.   Endocrine: Negative.   Genitourinary: Negative.   Musculoskeletal:  Positive for arthralgias, gait problem, joint swelling and myalgias.  Skin: Negative.   Hematological: Negative.     There were no vitals taken for this visit. Physical Exam Constitutional:      Appearance: Normal appearance.  HENT:     Head: Normocephalic and atraumatic.     Right Ear: External ear normal.     Left Ear: External ear normal.     Nose: Nose normal.      Mouth/Throat:     Pharynx: Oropharynx is clear.  Eyes:     Conjunctiva/sclera: Conjunctivae normal.  Cardiovascular:     Rate and Rhythm: Normal rate and regular rhythm.     Pulses: Normal pulses.     Heart sounds: Normal heart sounds.  Pulmonary:     Effort: Pulmonary effort is normal.     Breath sounds: Normal breath sounds.  Abdominal:     General: Bowel sounds are normal.  Musculoskeletal:     Cervical back: Normal range of motion.     Comments: Range of motion is -2 to 130. He has a varus deformity is tender medial joint line patellofemoral pain compression. Ipsilateral hip and ankle exams unremarkable. His right total knee is doing well at 0 140 with   no instability no pain   Skin:    General: Skin is warm and dry.  Neurological:     Mental Status: He is alert.      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 Beane on 11/17/22. We again discussed the procedure itself as well as risks, complications and alternatives, including but not limited to DVT, PE, infx, bleeding, failure of procedure, need for secondary procedure including manipulation, nerve injury, ongoing pain/symptoms, anesthesia risk, even stroke or death. Also discussed typical post-op protocols, activity restrictions, need for PT, flexion/extension exercises, time out of work. Discussed need for DVT ppx post-op per protocol. Discussed dental ppx and infx prevention. Also discussed limitations post-operatively such as kneeling and squatting. All questions were answered. Patient desires to proceed with surgery as scheduled.  Will hold supplements, ASA and NSAIDs accordingly. Will remain NPO after midnight the night before surgery. Will present to [facility] for pre-op testing. Anticipate hospital stay to include at least 2 midnights given  medical history and to ensure proper pain control. Plan ASA for DVT ppx post-op. Plan Oxy, Robaxin, Colace, Miralax. Plan discharge home post-op with family, start outpt PT immediately post-op. Will follow up 10-14 days post-op for staple removal and xrays.  Plan Left total knee replacement  Hilery Wintle M Lang Zingg, PA-C for Dr Beane 11/10/2022, 12:37 PM    

## 2022-11-10 NOTE — H&P (Signed)
Bretten Schneeberger is an 67 y.o. male.   Chief Complaint: left knee pain HPI: H&P left TKA scheduled 11/17/22 @ [facility].  Dr. Shelle Iron and the patient mutually agreed to proceed with a total knee replacement. Risks and benefits of the procedure were discussed including stiffness, suboptimal range of motion, persistent pain, infection requiring removal of prosthesis and reinsertion, need for prophylactic antibiotics in the future, for example, dental procedures, possible need for manipulation, revision in the future and also anesthetic complications including DVT, PE, etc. We discussed the perioperative course, time in the hospital, postoperative recovery and the need for elevation to control swelling. We also discussed the predicted range of motion and the probability that squatting and kneeling would be unobtainable in the future. In addition, postoperative anticoagulation was discussed. We have obtained preoperative medical clearance as necessary. Provided illustrated handout and discussed it in detail. They will enroll in the total joint replacement educational forum at the hospital.  He has been cleared. He is scheduled for his preop appointment at the hospital tomorrow.  Past Medical History:  Diagnosis Date   Arthritis    Asthma    As a child no problems now   Hallux rigidus of right foot    Hypertension    prostate cancer    Wears glasses     Past Surgical History:  Procedure Laterality Date   CALCANEAL OSTEOTOMY Right 05/04/2018   Procedure: MEDIAL CALCANEAL OSTEOTOMY;  Surgeon: Toni Arthurs, MD;  Location: [facility];  Service: Orthopedics;  Laterality: Right;   GASTROC RECESSION EXTREMITY Right 05/04/2018   Procedure: RIGHT GASTROC RECESSION;  Surgeon: Toni Arthurs, MD;  Location: [facility];  Service: Orthopedics;  Laterality: Right;   GOLD SEED IMPLANT N/A 09/15/2022   Procedure: GOLD SEED IMPLANT;  Surgeon: Crista Elliot, MD;  Location:  [facility];  Service: Urology;  Laterality: N/A;   HARDWARE REMOVAL Right 05/04/2018   Procedure: MEDIAL MALLEOLUS HARDWARE REMOVAL;  Surgeon: Toni Arthurs, MD;  Location:  [facility];  Service: Orthopedics;  Laterality: Right;   KNEE SURGERY     arthroscopy right knee   ORIF ANKLE FRACTURE Right 11/08/2014   Procedure: OPEN REDUCTION INTERNAL FIXATION (ORIF) RIGHT ANKLE ;  Surgeon: Jene Every, MD;  Location: [facility];  Service: Orthopedics;  Laterality: Right;   SPACE OAR INSTILLATION N/A 09/15/2022   Procedure: SPACE OAR INSTILLATION;  Surgeon: Crista Elliot, MD;  Location: [facility];  Service: Urology;  Laterality: N/A;   TOTAL KNEE ARTHROPLASTY Right 04/23/2016   Procedure: RIGHT TOTAL KNEE ARTHROPLASTY;  Surgeon: Jene Every, MD;  Location: [facility];  Service: Orthopedics;  Laterality: Right;    Family History  Problem Relation Age of Onset   Dementia Mother    Colon cancer Neg Hx    Social History:  reports that he has never smoked. He has never been exposed to tobacco smoke. He has never used smokeless tobacco. He reports current alcohol use. He reports that he does not use drugs.  Allergies: No Known Allergies  Current meds: amLODIPine 10 mg tablet HYDROcodone 5 mg-acetaminophen 325 mg tablet levoFLOXacin 750 mg tablet phenazopyridine 200 mg tablet   Results for orders placed or performed during the hospital encounter of 11/09/22 (from the past 48 hour(s))  Surgical pcr screen     Status: None   Collection Time: 11/09/22 10:16 AM   Specimen: Nasal Mucosa; Nasal Swab  Result Value Ref Range   MRSA, PCR  NEGATIVE NEGATIVE   Staphylococcus aureus NEGATIVE NEGATIVE    Comment: (NOTE) The Xpert SA Assay (FDA approved for NASAL specimens in patients 52 years of age and older), is one component of a comprehensive surveillance program. It is not intended to diagnose infection nor to guide or monitor treatment. Performed at  [facility], [address], [place], [place] [address]   Basic metabolic panel per protocol     Status: Abnormal   Collection Time: 11/09/22 10:38 AM  Result Value Ref Range   Sodium 138 135 - 145 mmol/L   Potassium 3.8 3.5 - 5.1 mmol/L   Chloride 107 98 - 111 mmol/L   CO2 23 22 - 32 mmol/L   Glucose, Bld 99 70 - 99 mg/dL    Comment: Glucose reference range applies only to samples taken after fasting for at least 8 hours.   BUN 13 8 - 23 mg/dL   Creatinine, Ser 6.04 (H) 0.61 - 1.24 mg/dL   Calcium 8.9 8.9 - 54.0 mg/dL   GFR, Estimated 59 (L) >60 mL/min    Comment: (NOTE) Calculated using the CKD-EPI Creatinine Equation (2021)    Anion gap 8 5 - 15    Comment: Performed at [facility], [address], [place], [place] [address]  CBC per protocol     Status: Abnormal   Collection Time: 11/09/22 10:38 AM  Result Value Ref Range   WBC 3.4 (L) 4.0 - 10.5 K/uL   RBC 4.99 4.22 - 5.81 MIL/uL   Hemoglobin 15.3 13.0 - 17.0 g/dL   HCT 14.7 82.9 - 56.2 %   MCV 90.6 80.0 - 100.0 fL   MCH 30.7 26.0 - 34.0 pg   MCHC 33.8 30.0 - 36.0 g/dL   RDW 13.0 86.5 - 78.4 %   Platelets 210 150 - 400 K/uL   nRBC 0.0 0.0 - 0.2 %    Comment: Performed at [facility], [address], [place], [place] [address]   No results found.  Review of Systems  Constitutional: Negative.   HENT: Negative.    Eyes: Negative.   Respiratory: Negative.    Cardiovascular: Negative.   Gastrointestinal: Negative.   Endocrine: Negative.   Genitourinary: Negative.   Musculoskeletal:  Positive for arthralgias, gait problem, joint swelling and myalgias.  Skin: Negative.   Hematological: Negative.     There were no vitals taken for this visit. Physical Exam Constitutional:      Appearance: Normal appearance.  HENT:     Head: Normocephalic and atraumatic.     Right Ear: External ear normal.     Left Ear: External ear normal.     Nose: Nose normal.      Mouth/Throat:     Pharynx: Oropharynx is clear.  Eyes:     Conjunctiva/sclera: Conjunctivae normal.  Cardiovascular:     Rate and Rhythm: Normal rate and regular rhythm.     Pulses: Normal pulses.     Heart sounds: Normal heart sounds.  Pulmonary:     Effort: Pulmonary effort is normal.     Breath sounds: Normal breath sounds.  Abdominal:     General: Bowel sounds are normal.  Musculoskeletal:     Cervical back: Normal range of motion.     Comments: Range of motion is -2 to 130. He has a varus deformity is tender medial joint line patellofemoral pain compression. Ipsilateral hip and ankle exams unremarkable. His right total knee is doing well at 0 140 with  no instability no pain   Skin:    General: Skin is warm and dry.  Neurological:     Mental Status: He is alert.      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 Shelle Iron on 11/17/22. We again discussed the procedure itself as well as risks, complications and alternatives, including but not limited to DVT, PE, infx, bleeding, failure of procedure, need for secondary procedure including manipulation, nerve injury, ongoing pain/symptoms, anesthesia risk, even stroke or death. Also discussed typical post-op protocols, activity restrictions, need for PT, flexion/extension exercises, time out of work. Discussed need for DVT ppx post-op per protocol. Discussed dental ppx and infx prevention. Also discussed limitations post-operatively such as kneeling and squatting. All questions were answered. Patient desires to proceed with surgery as scheduled.  Will hold supplements, ASA and NSAIDs accordingly. Will remain NPO after midnight the night before surgery. Will present to [facility] for pre-op testing. Anticipate hospital stay to include at least 2 midnights given  medical history and to ensure proper pain control. Plan ASA for DVT ppx post-op. Plan Oxy, Robaxin, Colace, Miralax. Plan discharge home post-op with family, start outpt PT immediately post-op. Will follow up 10-14 days post-op for staple removal and xrays.  Plan Left total knee replacement  Dorothy Spark, PA-C for Dr Shelle Iron 11/10/2022, 12:37 PM

## 2022-11-11 ENCOUNTER — Other Ambulatory Visit: Payer: Self-pay

## 2022-11-11 ENCOUNTER — Ambulatory Visit
Admission: RE | Admit: 2022-11-11 | Discharge: 2022-11-11 | Disposition: A | Discharge status: Home / Self Care | Payer: BC Managed Care – PPO | Source: Ambulatory Visit | Attending: Radiation Oncology | Admitting: Radiation Oncology

## 2022-11-11 ENCOUNTER — Encounter (HOSPITAL_COMMUNITY): Payer: Self-pay

## 2022-11-11 DIAGNOSIS — Z191 Hormone sensitive malignancy status: Secondary | ICD-10-CM | POA: Diagnosis not present

## 2022-11-11 DIAGNOSIS — C61 Malignant neoplasm of prostate: Secondary | ICD-10-CM

## 2022-11-11 DIAGNOSIS — Z510 Encounter for antineoplastic radiation therapy: Secondary | ICD-10-CM | POA: Diagnosis not present

## 2022-11-11 LAB — RAD ONC ARIA SESSION SUMMARY
Course Elapsed Days: 38
Plan Fractions Treated to Date: 28
Plan Prescribed Dose Per Fraction: 2.5 Gy
Plan Total Fractions Prescribed: 28
Plan Total Prescribed Dose: 70 Gy
Reference Point Dosage Given to Date: 70 Gy
Reference Point Session Dosage Given: 2.5 Gy
Session Number: 28

## 2022-11-12 NOTE — Radiation Completion Notes (Addendum)
  Radiation Oncology         [phone] ________________________________  Name: Yissachar Mckell MRN: [id number]  Date: 11/11/2022  DOB: November 27, 1955  End of Treatment Note  Patient Name: NAYQUAN, STRIMPLE MRN: [id number] Date of Birth: 01/31/1956 Referring Physician: Modena Slater, M.D. Date of Service: 2022-11-12 Radiation Oncologist: Margaretmary Bayley, M.D. [facility]   RADIATION ONCOLOGY END OF TREATMENT NOTE     Diagnosis: 67 y.o. gentleman with stage T1c adenocarcinoma of the prostate with a Gleason's score of 3+4 and a PSA of 5.12   Intent: Curative   ==========DELIVERED PLANS==========  First Treatment Date: 2022-10-04 - Last Treatment Date: 2022-11-11   Plan Name: Prostate Site: Prostate Technique: IMRT Mode: Photon Dose Per Fraction: 2.5 Gy Prescribed Dose (Delivered / Prescribed): 70 Gy / 70 Gy Prescribed Fxs (Delivered / Prescribed): 28 / 28     ==========ON TREATMENT VISIT DATES========== 2022-10-08, 2022-10-14, 2022-10-22, 2022-10-29, 2022-11-04, 2022-11-11     See weekly On Treatment Notes in Epic for details.  He tolerated the treatment relatively well with only mild urinary symptoms and modest fatigue.  The patient will receive a call in about one month from the radiation oncology department. He will continue follow up with his urologist, Dr. Alvester Morin as well.  ------------------------------------------------   Margaretmary Dys, MD [facility]  Radiation Oncology Direct Dial: [phone]  Fax: [phone] [email or web address]  Skype  LinkedIn

## 2022-11-16 NOTE — Anesthesia Preprocedure Evaluation (Signed)
Anesthesia Evaluation  Patient identified by MRN, date of birth, ID band Patient awake    Reviewed: Allergy & Precautions, NPO status , Patient's Chart, lab work & pertinent test results  Airway Mallampati: II  TM Distance: >3 FB Neck ROM: Full    Dental no notable dental hx. (+) Teeth Intact, Dental Advisory Given   Pulmonary asthma    Pulmonary exam normal breath sounds clear to auscultation       Cardiovascular hypertension, Pt. on medications Normal cardiovascular exam Rhythm:Regular Rate:Normal  TTE 2017 - Normal LV size with mild LV hypertrophy. EF 55-60%. Normal RV    size and systolic function. Mild aortic insufficiency.     Neuro/Psych  PSYCHIATRIC DISORDERS  Depression    negative neurological ROS     GI/Hepatic negative GI ROS, Neg liver ROS,,,  Endo/Other  negative endocrine ROS    Renal/GU negative Renal ROS  negative genitourinary   Musculoskeletal  (+) Arthritis ,    Abdominal   Peds  Hematology negative hematology ROS (+)   Anesthesia Other Findings   Reproductive/Obstetrics                             Anesthesia Physical Anesthesia Plan  ASA: 2  Anesthesia Plan: Spinal and Regional   Post-op Pain Management: Regional block* and Tylenol PO (pre-op)*   Induction:   PONV Risk Score and Plan: Treatment may vary due to age or medical condition, Midazolam, Propofol infusion, Ondansetron and Dexamethasone  Airway Management Planned: Natural Airway  Additional Equipment:   Intra-op Plan:   Post-operative Plan:   Informed Consent: I have reviewed the patients History and Physical, chart, labs and discussed the procedure including the risks, benefits and alternatives for the proposed anesthesia with the patient or authorized representative who has indicated his/her understanding and acceptance.     Dental advisory given  Plan Discussed with: CRNA  Anesthesia  Plan Comments:        Anesthesia Quick Evaluation

## 2022-11-17 ENCOUNTER — Ambulatory Visit (HOSPITAL_COMMUNITY): Payer: BC Managed Care – PPO | Admitting: Anesthesiology

## 2022-11-17 ENCOUNTER — Encounter (HOSPITAL_COMMUNITY)
Admission: RE | Disposition: A | Discharge status: Home / Self Care | Payer: Self-pay | Source: Home / Self Care | Attending: Specialist

## 2022-11-17 ENCOUNTER — Other Ambulatory Visit: Payer: Self-pay

## 2022-11-17 ENCOUNTER — Encounter (HOSPITAL_COMMUNITY): Payer: Self-pay | Admitting: Specialist

## 2022-11-17 ENCOUNTER — Ambulatory Visit (HOSPITAL_COMMUNITY)
Admission: RE | Admit: 2022-11-17 | Discharge: 2022-11-19 | Disposition: A | Discharge status: Home / Self Care | Payer: BC Managed Care – PPO | Attending: Specialist | Admitting: Specialist

## 2022-11-17 ENCOUNTER — Ambulatory Visit (HOSPITAL_COMMUNITY): Payer: BC Managed Care – PPO | Admitting: Medical

## 2022-11-17 ENCOUNTER — Ambulatory Visit (HOSPITAL_COMMUNITY): Payer: BC Managed Care – PPO

## 2022-11-17 DIAGNOSIS — M1711 Unilateral primary osteoarthritis, right knee: Secondary | ICD-10-CM

## 2022-11-17 DIAGNOSIS — M25762 Osteophyte, left knee: Secondary | ICD-10-CM | POA: Insufficient documentation

## 2022-11-17 DIAGNOSIS — M1712 Unilateral primary osteoarthritis, left knee: Secondary | ICD-10-CM | POA: Diagnosis not present

## 2022-11-17 DIAGNOSIS — I10 Essential (primary) hypertension: Secondary | ICD-10-CM | POA: Diagnosis not present

## 2022-11-17 DIAGNOSIS — Z01818 Encounter for other preprocedural examination: Secondary | ICD-10-CM

## 2022-11-17 DIAGNOSIS — Z96652 Presence of left artificial knee joint: Secondary | ICD-10-CM | POA: Diagnosis not present

## 2022-11-17 DIAGNOSIS — G8918 Other acute postprocedural pain: Secondary | ICD-10-CM | POA: Diagnosis not present

## 2022-11-17 DIAGNOSIS — M21162 Varus deformity, not elsewhere classified, left knee: Secondary | ICD-10-CM | POA: Diagnosis not present

## 2022-11-17 DIAGNOSIS — M179 Osteoarthritis of knee, unspecified: Secondary | ICD-10-CM

## 2022-11-17 HISTORY — PX: TOTAL KNEE ARTHROPLASTY: SHX125

## 2022-11-17 SURGERY — ARTHROPLASTY, KNEE, TOTAL
Anesthesia: Regional | Site: Knee | Laterality: Left

## 2022-11-17 MED ORDER — OXYCODONE HCL 5 MG PO TABS
5.0000 mg | ORAL_TABLET | ORAL | Status: DC | PRN
  Administered 2022-11-18: 10 mg via ORAL
  Filled 2022-11-17: qty 2

## 2022-11-17 MED ORDER — PHENOL 1.4 % MT LIQD: 1.0000 | OROMUCOSAL | Status: DC | PRN

## 2022-11-17 MED ORDER — ACETAMINOPHEN 500 MG PO TABS
1000.0000 mg | ORAL_TABLET | Freq: Once | ORAL | Status: AC
  Administered 2022-11-17: 1000 mg via ORAL
  Filled 2022-11-17: qty 2

## 2022-11-17 MED ORDER — LORAZEPAM 1 MG PO TABS: 1.0000 mg | ORAL_TABLET | Freq: Four times a day (QID) | ORAL | Status: DC | PRN

## 2022-11-17 MED ORDER — BISACODYL 5 MG PO TBEC: 5.0000 mg | DELAYED_RELEASE_TABLET | Freq: Every day | ORAL | Status: DC | PRN

## 2022-11-17 MED ORDER — FENTANYL CITRATE PF 50 MCG/ML IJ SOSY: 25.0000 ug | PREFILLED_SYRINGE | INTRAMUSCULAR | Status: DC | PRN

## 2022-11-17 MED ORDER — CEFAZOLIN SODIUM-DEXTROSE 2-4 GM/100ML-% IV SOLN: 2.0000 g | Freq: Three times a day (TID) | INTRAVENOUS | Status: DC

## 2022-11-17 MED ORDER — DOCUSATE SODIUM 100 MG PO CAPS: 100.0000 mg | ORAL_CAPSULE | Freq: Two times a day (BID) | ORAL | 1 refills | Status: DC | PRN

## 2022-11-17 MED ORDER — ONDANSETRON HCL 4 MG PO TABS: 4.0000 mg | ORAL_TABLET | Freq: Four times a day (QID) | ORAL | Status: DC | PRN

## 2022-11-17 MED ORDER — RISAQUAD PO CAPS
1.0000 | ORAL_CAPSULE | Freq: Every day | ORAL | Status: DC
  Administered 2022-11-17 – 2022-11-19 (×3): 1 via ORAL
  Filled 2022-11-17 (×3): qty 1

## 2022-11-17 MED ORDER — BUPIVACAINE LIPOSOME 1.3 % IJ SUSP
INTRAMUSCULAR | Status: AC
  Filled 2022-11-17: qty 20

## 2022-11-17 MED ORDER — POLYETHYLENE GLYCOL 3350 17 G PO PACK: 17.0000 g | PACK | Freq: Every day | ORAL | 0 refills | Status: DC

## 2022-11-17 MED ORDER — PHENYLEPHRINE HCL-NACL 20-0.9 MG/250ML-% IV SOLN
INTRAVENOUS | Status: DC | PRN
  Administered 2022-11-17: 20 ug/min via INTRAVENOUS

## 2022-11-17 MED ORDER — ALUM & MAG HYDROXIDE-SIMETH 200-200-20 MG/5ML PO SUSP: 30.0000 mL | ORAL | Status: DC | PRN

## 2022-11-17 MED ORDER — CHLORHEXIDINE GLUCONATE 0.12 % MT SOLN
15.0000 mL | Freq: Once | OROMUCOSAL | Status: AC
  Administered 2022-11-17: 15 mL via OROMUCOSAL

## 2022-11-17 MED ORDER — LIDOCAINE HCL (PF) 2 % IJ SOLN
INTRAMUSCULAR | Status: AC
  Filled 2022-11-17: qty 5

## 2022-11-17 MED ORDER — CEFAZOLIN SODIUM-DEXTROSE 2-4 GM/100ML-% IV SOLN
2.0000 g | INTRAVENOUS | Status: AC
  Administered 2022-11-17: 2 g via INTRAVENOUS
  Filled 2022-11-17: qty 100

## 2022-11-17 MED ORDER — CEFAZOLIN SODIUM-DEXTROSE 2-4 GM/100ML-% IV SOLN
2.0000 g | Freq: Four times a day (QID) | INTRAVENOUS | Status: AC
  Administered 2022-11-17 (×2): 2 g via INTRAVENOUS
  Filled 2022-11-17 (×2): qty 100

## 2022-11-17 MED ORDER — PROPOFOL 1000 MG/100ML IV EMUL
INTRAVENOUS | Status: AC
  Filled 2022-11-17: qty 100

## 2022-11-17 MED ORDER — METHOCARBAMOL 1000 MG/10ML IJ SOLN
500.0000 mg | Freq: Four times a day (QID) | INTRAVENOUS | Status: DC | PRN
  Administered 2022-11-17 – 2022-11-18 (×2): 500 mg via INTRAVENOUS
  Filled 2022-11-17 (×2): qty 500

## 2022-11-17 MED ORDER — METOCLOPRAMIDE HCL 5 MG PO TABS: 5.0000 mg | ORAL_TABLET | Freq: Three times a day (TID) | ORAL | Status: DC | PRN

## 2022-11-17 MED ORDER — SODIUM CHLORIDE 0.9 % IR SOLN
Status: DC | PRN
  Administered 2022-11-17: 3000 mL

## 2022-11-17 MED ORDER — DIPHENHYDRAMINE HCL 12.5 MG/5ML PO ELIX: 12.5000 mg | ORAL_SOLUTION | ORAL | Status: DC | PRN

## 2022-11-17 MED ORDER — ONDANSETRON HCL 4 MG/2ML IJ SOLN: 4.0000 mg | Freq: Four times a day (QID) | INTRAMUSCULAR | Status: DC | PRN

## 2022-11-17 MED ORDER — PROPOFOL 10 MG/ML IV BOLUS
INTRAVENOUS | Status: DC | PRN
  Administered 2022-11-17: 20 mg via INTRAVENOUS
  Administered 2022-11-17: 30 mg via INTRAVENOUS
  Administered 2022-11-17: 20 mg via INTRAVENOUS

## 2022-11-17 MED ORDER — ONDANSETRON HCL 4 MG/2ML IJ SOLN
INTRAMUSCULAR | Status: AC
  Filled 2022-11-17: qty 2

## 2022-11-17 MED ORDER — EPINEPHRINE PF 1 MG/ML IJ SOLN
INTRAMUSCULAR | Status: AC
  Filled 2022-11-17: qty 1

## 2022-11-17 MED ORDER — TRANEXAMIC ACID-NACL 1000-0.7 MG/100ML-% IV SOLN
1000.0000 mg | INTRAVENOUS | Status: AC
  Administered 2022-11-17: 1000 mg via INTRAVENOUS
  Filled 2022-11-17: qty 100

## 2022-11-17 MED ORDER — SODIUM CHLORIDE (PF) 0.9 % IJ SOLN
INTRAMUSCULAR | Status: AC
  Filled 2022-11-17: qty 50

## 2022-11-17 MED ORDER — METHOCARBAMOL 500 MG PO TABS: 500.0000 mg | ORAL_TABLET | Freq: Three times a day (TID) | ORAL | 1 refills | Status: DC | PRN

## 2022-11-17 MED ORDER — DEXAMETHASONE SODIUM PHOSPHATE 10 MG/ML IJ SOLN
INTRAMUSCULAR | Status: DC | PRN
  Administered 2022-11-17: 10 mg via INTRAVENOUS

## 2022-11-17 MED ORDER — OXYCODONE HCL 5 MG PO TABS: 5.0000 mg | ORAL_TABLET | ORAL | 0 refills | Status: DC | PRN

## 2022-11-17 MED ORDER — PROPOFOL 500 MG/50ML IV EMUL
INTRAVENOUS | Status: DC | PRN
  Administered 2022-11-17: 75 ug/kg/min via INTRAVENOUS

## 2022-11-17 MED ORDER — BUPIVACAINE HCL (PF) 0.25 % IJ SOLN
INTRAMUSCULAR | Status: AC
  Filled 2022-11-17: qty 30

## 2022-11-17 MED ORDER — MIDAZOLAM HCL 5 MG/5ML IJ SOLN
INTRAMUSCULAR | Status: DC | PRN
  Administered 2022-11-17: 2 mg via INTRAVENOUS

## 2022-11-17 MED ORDER — ONDANSETRON HCL 4 MG/2ML IJ SOLN
INTRAMUSCULAR | Status: DC | PRN
  Administered 2022-11-17: 4 mg via INTRAVENOUS

## 2022-11-17 MED ORDER — ACETAMINOPHEN 325 MG PO TABS
325.0000 mg | ORAL_TABLET | Freq: Four times a day (QID) | ORAL | Status: DC | PRN
  Administered 2022-11-19: 650 mg via ORAL
  Filled 2022-11-17: qty 2

## 2022-11-17 MED ORDER — MAGNESIUM CITRATE PO SOLN: 1.0000 | Freq: Once | ORAL | Status: DC | PRN

## 2022-11-17 MED ORDER — LACTATED RINGERS IV SOLN: INTRAVENOUS | Status: DC

## 2022-11-17 MED ORDER — BUPIVACAINE LIPOSOME 1.3 % IJ SUSP
INTRAMUSCULAR | Status: DC | PRN
  Administered 2022-11-17: 20 mL

## 2022-11-17 MED ORDER — ROPIVACAINE HCL 5 MG/ML IJ SOLN
INTRAMUSCULAR | Status: DC | PRN
  Administered 2022-11-17: 20 mL via PERINEURAL

## 2022-11-17 MED ORDER — GABAPENTIN 300 MG PO CAPS
300.0000 mg | ORAL_CAPSULE | Freq: Three times a day (TID) | ORAL | Status: DC
  Administered 2022-11-17 – 2022-11-19 (×6): 300 mg via ORAL
  Filled 2022-11-17 (×6): qty 1

## 2022-11-17 MED ORDER — AMLODIPINE BESYLATE 10 MG PO TABS
10.0000 mg | ORAL_TABLET | Freq: Every day | ORAL | Status: DC
  Administered 2022-11-17 – 2022-11-19 (×3): 10 mg via ORAL
  Filled 2022-11-17 (×3): qty 1

## 2022-11-17 MED ORDER — SODIUM CHLORIDE 0.9% FLUSH
INTRAVENOUS | Status: DC | PRN
  Administered 2022-11-17: 40 mL

## 2022-11-17 MED ORDER — POTASSIUM CHLORIDE IN NACL 20-0.9 MEQ/L-% IV SOLN
INTRAVENOUS | Status: DC
  Filled 2022-11-17 (×2): qty 1000

## 2022-11-17 MED ORDER — MENTHOL 3 MG MT LOZG: 1.0000 | LOZENGE | OROMUCOSAL | Status: DC | PRN

## 2022-11-17 MED ORDER — DEXAMETHASONE SODIUM PHOSPHATE 10 MG/ML IJ SOLN
INTRAMUSCULAR | Status: AC
  Filled 2022-11-17: qty 1

## 2022-11-17 MED ORDER — HYDROMORPHONE HCL 2 MG PO TABS
2.0000 mg | ORAL_TABLET | ORAL | Status: DC | PRN
  Administered 2022-11-18 – 2022-11-19 (×6): 4 mg via ORAL
  Filled 2022-11-17 (×6): qty 2

## 2022-11-17 MED ORDER — OXYCODONE HCL 5 MG PO TABS
10.0000 mg | ORAL_TABLET | ORAL | Status: DC | PRN
  Administered 2022-11-17 – 2022-11-18 (×4): 15 mg via ORAL
  Filled 2022-11-17 (×5): qty 3

## 2022-11-17 MED ORDER — STERILE WATER FOR IRRIGATION IR SOLN
Status: DC | PRN
  Administered 2022-11-17: 2000 mL

## 2022-11-17 MED ORDER — BUPIVACAINE IN DEXTROSE 0.75-8.25 % IT SOLN
INTRATHECAL | Status: DC | PRN
  Administered 2022-11-17: 2 mL via INTRATHECAL

## 2022-11-17 MED ORDER — HYDROMORPHONE HCL 1 MG/ML IJ SOLN
1.0000 mg | INTRAMUSCULAR | Status: DC | PRN
  Administered 2022-11-17: 2 mg via INTRAVENOUS
  Administered 2022-11-18: 1 mg via INTRAVENOUS
  Filled 2022-11-17: qty 1
  Filled 2022-11-17: qty 2

## 2022-11-17 MED ORDER — HYDROMORPHONE HCL 1 MG/ML IJ SOLN
0.5000 mg | INTRAMUSCULAR | Status: DC | PRN
  Administered 2022-11-17: 1 mg via INTRAVENOUS
  Filled 2022-11-17: qty 1

## 2022-11-17 MED ORDER — DEXAMETHASONE SODIUM PHOSPHATE 10 MG/ML IJ SOLN
INTRAMUSCULAR | Status: DC | PRN
  Administered 2022-11-17: 10 mg

## 2022-11-17 MED ORDER — ACETAMINOPHEN 10 MG/ML IV SOLN: 1000.0000 mg | INTRAVENOUS | Status: DC

## 2022-11-17 MED ORDER — FENTANYL CITRATE (PF) 100 MCG/2ML IJ SOLN
INTRAMUSCULAR | Status: AC
  Filled 2022-11-17: qty 2

## 2022-11-17 MED ORDER — PHENAZOPYRIDINE HCL 200 MG PO TABS: 200.0000 mg | ORAL_TABLET | Freq: Three times a day (TID) | ORAL | Status: DC | PRN

## 2022-11-17 MED ORDER — METOCLOPRAMIDE HCL 5 MG/ML IJ SOLN: 5.0000 mg | Freq: Three times a day (TID) | INTRAMUSCULAR | Status: DC | PRN

## 2022-11-17 MED ORDER — ACETAMINOPHEN 500 MG PO TABS
1000.0000 mg | ORAL_TABLET | Freq: Four times a day (QID) | ORAL | Status: AC
  Administered 2022-11-17 – 2022-11-18 (×4): 1000 mg via ORAL
  Filled 2022-11-17 (×4): qty 2

## 2022-11-17 MED ORDER — 0.9 % SODIUM CHLORIDE (POUR BTL) OPTIME
TOPICAL | Status: DC | PRN
  Administered 2022-11-17: 1000 mL

## 2022-11-17 MED ORDER — ASPIRIN 81 MG PO CHEW
81.0000 mg | CHEWABLE_TABLET | Freq: Two times a day (BID) | ORAL | Status: DC
  Administered 2022-11-18 – 2022-11-19 (×3): 81 mg via ORAL
  Filled 2022-11-17 (×3): qty 1

## 2022-11-17 MED ORDER — POLYETHYLENE GLYCOL 3350 17 G PO PACK
17.0000 g | PACK | Freq: Every day | ORAL | Status: DC
  Administered 2022-11-17 – 2022-11-19 (×3): 17 g via ORAL
  Filled 2022-11-17 (×3): qty 1

## 2022-11-17 MED ORDER — BUPIVACAINE-EPINEPHRINE 0.25% -1:200000 IJ SOLN
INTRAMUSCULAR | Status: DC | PRN
  Administered 2022-11-17: 20 mL

## 2022-11-17 MED ORDER — DOCUSATE SODIUM 100 MG PO CAPS
100.0000 mg | ORAL_CAPSULE | Freq: Two times a day (BID) | ORAL | Status: DC
  Administered 2022-11-17 – 2022-11-19 (×4): 100 mg via ORAL
  Filled 2022-11-17 (×4): qty 1

## 2022-11-17 MED ORDER — ASPIRIN 81 MG PO TBEC: 81.0000 mg | DELAYED_RELEASE_TABLET | Freq: Two times a day (BID) | ORAL | 1 refills | Status: DC

## 2022-11-17 MED ORDER — ORAL CARE MOUTH RINSE: 15.0000 mL | Freq: Once | OROMUCOSAL | Status: AC

## 2022-11-17 MED ORDER — PROPOFOL 10 MG/ML IV BOLUS
INTRAVENOUS | Status: AC
  Filled 2022-11-17: qty 20

## 2022-11-17 MED ORDER — FENTANYL CITRATE (PF) 100 MCG/2ML IJ SOLN
INTRAMUSCULAR | Status: DC | PRN
  Administered 2022-11-17 (×2): 50 ug via INTRAVENOUS

## 2022-11-17 MED ORDER — MIDAZOLAM HCL 2 MG/2ML IJ SOLN
INTRAMUSCULAR | Status: AC
  Filled 2022-11-17: qty 2

## 2022-11-17 SURGICAL SUPPLY — 80 items
AGENT HMST SPONGE THK3/8 (HEMOSTASIS)
ATTUNE MED DOME PAT 38 KNEE (Knees) IMPLANT
ATTUNE PS FEM LT SZ 7 CEM KNEE (Femur) IMPLANT
ATTUNE PSRP INSR SZ7 6 KNEE (Insert) IMPLANT
BAG COUNTER SPONGE SURGICOUNT (BAG) IMPLANT
BAG DECANTER FOR FLEXI CONT (MISCELLANEOUS) ×1 IMPLANT
BAG SPEC THK2 15X12 ZIP CLS (MISCELLANEOUS)
BAG SPNG CNTER NS LX DISP (BAG)
BAG ZIPLOCK 12X15 (MISCELLANEOUS) IMPLANT
BASE TIBIAL ROT PLAT SZ 8 KNEE (Knees) IMPLANT
BLADE SAW SGTL 11.0X1.19X90.0M (BLADE) ×1 IMPLANT
BLADE SAW SGTL 13.0X1.19X90.0M (BLADE) ×1 IMPLANT
BLADE SURG SZ10 CARB STEEL (BLADE) ×2 IMPLANT
BNDG CMPR 5X4 KNIT ELC UNQ LF (GAUZE/BANDAGES/DRESSINGS) ×1
BNDG CMPR 5X62 HK CLSR LF (GAUZE/BANDAGES/DRESSINGS) ×1
BNDG CMPR 6 X 5 YARDS HK CLSR (GAUZE/BANDAGES/DRESSINGS) ×1
BNDG CMPR MED 10X6 ELC LF (GAUZE/BANDAGES/DRESSINGS) ×1
BNDG CMPR STD VLCR NS LF 5.8X4 (GAUZE/BANDAGES/DRESSINGS) ×1
BNDG ELASTIC 4INX 5YD STR LF (GAUZE/BANDAGES/DRESSINGS) ×1 IMPLANT
BNDG ELASTIC 4X5.8 VLCR NS LF (GAUZE/BANDAGES/DRESSINGS) IMPLANT
BNDG ELASTIC 6INX 5YD STR LF (GAUZE/BANDAGES/DRESSINGS) ×1 IMPLANT
BNDG ELASTIC 6X10 VLCR STRL LF (GAUZE/BANDAGES/DRESSINGS) IMPLANT
BOWL SMART MIX CTS (DISPOSABLE) ×1 IMPLANT
BSPLAT TIB 8 CMNT ROT PLAT STR (Knees) ×1 IMPLANT
CEMENT HV SMART SET (Cement) ×2 IMPLANT
COVER SURGICAL LIGHT HANDLE (MISCELLANEOUS) ×1 IMPLANT
CUFF TOURN SGL QUICK 34 (TOURNIQUET CUFF) ×1
CUFF TRNQT CYL 34X4.125X (TOURNIQUET CUFF) ×1 IMPLANT
DRAPE INCISE IOBAN 66X45 STRL (DRAPES) IMPLANT
DRAPE ORTHO SPLIT 77X108 STRL (DRAPES) ×2
DRAPE SHEET LG 3/4 BI-LAMINATE (DRAPES) ×1 IMPLANT
DRAPE SURG ORHT 6 SPLT 77X108 (DRAPES) ×2 IMPLANT
DRAPE TOP 10253 STERILE (DRAPES) ×1 IMPLANT
DRAPE U-SHAPE 47X51 STRL (DRAPES) ×1 IMPLANT
DRSG AQUACEL AG ADV 3.5X10 (GAUZE/BANDAGES/DRESSINGS) ×1 IMPLANT
DRSG TEGADERM 4X4.75 (GAUZE/BANDAGES/DRESSINGS) IMPLANT
DURAPREP 26ML APPLICATOR (WOUND CARE) ×1 IMPLANT
ELECT BLADE TIP CTD 4 INCH (ELECTRODE) IMPLANT
ELECT REM PT RETURN 15FT ADLT (MISCELLANEOUS) ×1 IMPLANT
EVACUATOR 1/8 PVC DRAIN (DRAIN) IMPLANT
GAUZE SPONGE 2X2 8PLY STRL LF (GAUZE/BANDAGES/DRESSINGS) IMPLANT
GLOVE BIO SURGEON STRL SZ7 (GLOVE) ×1 IMPLANT
GLOVE BIOGEL PI IND STRL 7.0 (GLOVE) ×1 IMPLANT
GLOVE BIOGEL PI IND STRL 8 (GLOVE) ×1 IMPLANT
GLOVE SURG SS PI 8.0 STRL IVOR (GLOVE) ×1 IMPLANT
GOWN STRL REUS W/ TWL XL LVL3 (GOWN DISPOSABLE) ×2 IMPLANT
GOWN STRL REUS W/TWL XL LVL3 (GOWN DISPOSABLE) ×2
HANDPIECE INTERPULSE COAX TIP (DISPOSABLE) ×1
HEMOSTAT SPONGE AVITENE ULTRA (HEMOSTASIS) IMPLANT
HOLDER FOLEY CATH W/STRAP (MISCELLANEOUS) IMPLANT
IMMOBILIZER KNEE 20 (SOFTGOODS) ×1
IMMOBILIZER KNEE 20 THIGH 36 (SOFTGOODS) ×1 IMPLANT
KIT TURNOVER KIT A (KITS) IMPLANT
MANIFOLD NEPTUNE II (INSTRUMENTS) ×1 IMPLANT
NS IRRIG 1000ML POUR BTL (IV SOLUTION) IMPLANT
PACK TOTAL KNEE CUSTOM (KITS) ×1 IMPLANT
PIN STEINMAN FIXATION KNEE (PIN) IMPLANT
PROTECTOR NERVE ULNAR (MISCELLANEOUS) ×1 IMPLANT
SAW OSC TIP CART 19.5X105X1.3 (SAW) IMPLANT
SEALER BIPOLAR AQUA 6.0 (INSTRUMENTS) IMPLANT
SET HNDPC FAN SPRY TIP SCT (DISPOSABLE) ×1 IMPLANT
SOLUTION PRONTOSAN WOUND 350ML (IRRIGATION / IRRIGATOR) ×1 IMPLANT
SPIKE FLUID TRANSFER (MISCELLANEOUS) ×1 IMPLANT
STAPLER VISISTAT (STAPLE) IMPLANT
STRIP CLOSURE SKIN 1/2X4 (GAUZE/BANDAGES/DRESSINGS) IMPLANT
SUT BONE WAX W31G (SUTURE) ×1 IMPLANT
SUT MNCRL AB 4-0 PS2 18 (SUTURE) IMPLANT
SUT STRATAFIX 0 PDS 27 VIOLET (SUTURE) ×1
SUT VIC AB 1 CT1 27 (SUTURE) ×4
SUT VIC AB 1 CT1 27XBRD ANTBC (SUTURE) ×3 IMPLANT
SUT VIC AB 2-0 CT1 27 (SUTURE) ×3
SUT VIC AB 2-0 CT1 TAPERPNT 27 (SUTURE) ×3 IMPLANT
SUTURE STRATFX 0 PDS 27 VIOLET (SUTURE) ×1 IMPLANT
SYR 3ML LL SCALE MARK (SYRINGE) IMPLANT
TIBIAL BASE ROT PLAT SZ 8 KNEE (Knees) ×1 IMPLANT
TRAY FOLEY MTR SLVR 16FR STAT (SET/KITS/TRAYS/PACK) ×1 IMPLANT
TUBE SUCTION HIGH CAP CLEAR NV (SUCTIONS) ×1 IMPLANT
WATER STERILE IRR 1000ML POUR (IV SOLUTION) ×1 IMPLANT
WIPE CHG 2% PREP (PERSONAL CARE ITEMS) ×1 IMPLANT
WRAP KNEE MAXI GEL POST OP (GAUZE/BANDAGES/DRESSINGS) ×1 IMPLANT

## 2022-11-17 NOTE — Transfer of Care (Signed)
Immediate Anesthesia Transfer of Care Note  Patient: Vincent Ray  Procedure(s) Performed: TOTAL KNEE ARTHROPLASTY (Left: Knee)  Patient Location: PACU  Anesthesia Type:Spinal  Level of Consciousness: drowsy  Airway & Oxygen Therapy: Patient Spontanous Breathing and Patient connected to face mask oxygen  Post-op Assessment: Report given to RN and Post -op Vital signs reviewed and stable  Post vital signs: Reviewed and stable  Last Vitals:  Vitals Value Taken Time  BP 102/63 11/17/22 1012  Temp    Pulse 89 11/17/22 1013  Resp 15 11/17/22 1013  SpO2 94 % 11/17/22 1013  Vitals shown include unvalidated device data.  Last Pain:  Vitals:   11/17/22 0547  TempSrc: Oral         Complications: No notable events documented.

## 2022-11-17 NOTE — Evaluation (Signed)
Physical Therapy Evaluation Patient Details Name: Vincent Ray MRN: [id number] DOB: [birth year] Today's Date: 11/17/2022  History of Present Illness  67 yo male presents to therapy s/p L TKA on 11/17/2022 due to failure of conservative measures. Pt PMH includes but is not limited to: arthritis, HTN, prostate ca, R ankle Fx s/p ORIF, and R TKA.  Clinical Impression    Aime Meloche is a 67 y.o. male POD 0 s/p L TKA. Patient reports IND with mobility at baseline. Patient is now limited by functional impairments (see PT problem list below) and requires min guard for bed mobility and min guard and cues for transfers. Patient was able to ambulate 10 feet with RW and min guard level of assist. Patient instructed in exercise to facilitate ROM and circulation to manage edema.  Patient will benefit from continued skilled PT interventions to address impairments and progress towards PLOF. Acute PT will follow to progress mobility and stair training in preparation for safe discharge home with family and OPPT services.      Recommendations for follow up therapy are one component of a multi-disciplinary discharge planning process, led by the attending physician.  Recommendations may be updated based on patient status, additional functional criteria and insurance authorization.  Follow Up Recommendations       Assistance Recommended at Discharge Intermittent Supervision/Assistance  Patient can return home with the following  A little help with walking and/or transfers;A little help with bathing/dressing/bathroom;Assistance with cooking/housework;Assist for transportation;Help with stairs or ramp for entrance    Equipment Recommendations None recommended by PT (pt reports DME in home setting)  Recommendations for Other Services       Functional Status Assessment Patient has had a recent decline in their functional status and demonstrates the ability to make significant improvements in  function in a reasonable and predictable amount of time.     Precautions / Restrictions Precautions Precautions: Knee;Fall Restrictions Weight Bearing Restrictions: No      Mobility  Bed Mobility Overal bed mobility: Needs Assistance Bed Mobility: Supine to Sit     Supine to sit: Min guard, HOB elevated     General bed mobility comments: increased time, cues and use of bed rails    Transfers Overall transfer level: Needs assistance Equipment used: Rolling walker (2 wheels) Transfers: Sit to/from Stand Sit to Stand: Min guard           General transfer comment: cues for proper UE placement    Ambulation/Gait Ambulation/Gait assistance: Min guard Gait Distance (Feet): 10 Feet Assistive device: Rolling walker (2 wheels) Gait Pattern/deviations: Step-to pattern, Antalgic, Trunk flexed Gait velocity: decreased     General Gait Details: heavy reliance on RW for offloading LLE in stance phase, noted LLE  instability at end of gait bout  Stairs            Wheelchair Mobility    Modified Rankin (Stroke Patients Only)       Balance Overall balance assessment: Needs assistance Sitting-balance support: Feet supported Sitting balance-Leahy Scale: Good     Standing balance support: Bilateral upper extremity supported, During functional activity, Reliant on assistive device for balance Standing balance-Leahy Scale: Poor                               Pertinent Vitals/Pain Pain Assessment Pain Assessment: 0-10 Pain Score: 9  (asleep when PT arrived, 3/10 at rest and 9/10 with mobility) Pain Location: L  knee Pain Descriptors /  Indicators: Aching, Constant, Discomfort, Operative site guarding Pain Intervention(s): Limited activity within patient's tolerance, Monitored during session, Premedicated before session, Repositioned, Ice applied    Home Living Family/patient expects to be discharged to:: Private residence Living Arrangements:  Spouse/significant other Available Help at Discharge: Family Type of Home: House Home Access: Stairs to enter Entrance Stairs-Rails: None Entrance Stairs-Number of Steps: 1 (carport)   Home Layout: One level Home Equipment: Agricultural consultant (2 wheels);Cane - single point      Prior Function Prior Level of Function : Independent/Modified Independent             Mobility Comments: IND with all ADLs, self care tasks, IADls, and driving       Hand Dominance        Extremity/Trunk Assessment        Lower Extremity Assessment Lower Extremity Assessment: LLE deficits/detail LLE Deficits / Details: ankle DF/PF; AA for SLR with pt demonstrating pain behaviors LLE Coordination: WNL    Cervical / Trunk Assessment Cervical / Trunk Assessment:  (wfl)  Communication   Communication: No difficulties  Cognition Arousal/Alertness: Awake/alert Behavior During Therapy: WFL for tasks assessed/performed Overall Cognitive Status: Within Functional Limits for tasks assessed                                          General Comments      Exercises Total Joint Exercises Ankle Circles/Pumps: AROM, Both, 20 reps   Assessment/Plan    PT Assessment Patient needs continued PT services  PT Problem List Decreased strength;Decreased activity tolerance;Decreased range of motion;Decreased balance;Decreased mobility;Decreased coordination;Pain       PT Treatment Interventions DME instruction;Gait training;Stair training;Functional mobility training;Therapeutic activities;Therapeutic exercise;Balance training;Neuromuscular re-education;Patient/family education;Modalities    PT Goals (Current goals can be found in the Care Plan section)  Acute Rehab PT Goals Patient Stated Goal: to be able to walk again, get back the gym maybe play tennis PT Goal Formulation: With patient/family Time For Goal Achievement: 12/01/22 Potential to Achieve Goals: Good    Frequency 7X/week      Co-evaluation               AM-PAC PT "6 Clicks" Mobility  Outcome Measure Help needed turning from your back to your side while in a flat bed without using bedrails?: A Little Help needed moving from lying on your back to sitting on the side of a flat bed without using bedrails?: A Little Help needed moving to and from a bed to a chair (including a wheelchair)?: A Little Help needed standing up from a chair using your arms (e.g., wheelchair or bedside chair)?: A Little Help needed to walk in hospital room?: A Little Help needed climbing 3-5 steps with a railing? : A Lot 6 Click Score: 17    End of Session Equipment Utilized During Treatment: Gait belt Activity Tolerance: Patient limited by pain Patient left: in chair;with chair alarm set;with family/visitor present;with call bell/phone within reach Nurse Communication: Mobility status;Patient requests pain meds PT Visit Diagnosis: Unsteadiness on feet (R26.81);Other abnormalities of gait and mobility (R26.89);Muscle weakness (generalized) (M62.81);Difficulty in walking, not elsewhere classified (R26.2);Pain Pain - Right/Left: Left Pain - part of body: Knee;Leg    Time: 4098-1191 PT Time Calculation (min) (ACUTE ONLY): 32 min   Charges:   PT Evaluation $PT Eval Low Complexity: 1 Low PT Treatments $Gait Training: 8-22 mins  Johnny Bridge, PT Acute Rehab   Jacqualyn Posey 11/17/2022, 6:15 PM

## 2022-11-17 NOTE — Op Note (Unsigned)
NAMEHARTWELL, Vincent Ray MEDICAL RECORD NO: [id number] ACCOUNT NO: [id number] DATE OF BIRTH: Aug 20, 1955 FACILITY: [facility] LOCATION: [facility] PHYSICIAN: Javier Docker, MD  Operative Report   DATE OF PROCEDURE: 11/17/2022  PREOPERATIVE DIAGNOSIS:  End-stage osteoarthrosis, varus deformity, left knee.  POSTOPERATIVE DIAGNOSES:  End-stage osteoarthrosis, varus deformity, left knee.  PROCEDURE PERFORMED:  Left total knee arthroplasty utilizing Attune rotating platform 7 femur, 7 tibia, 6 mm insert, 38 patella.  ANESTHESIA:  Spinal.  ASSISTANT:  Andrez Grime, PA.  HISTORY:  A 67 year old male with end-stage osteoarthrosis medial compartment left knee indicated for replacement of the degenerated joint, failing conservative treatment.  Risks and benefits discussed including bleeding, infection, damage to  neurovascular structures, no change in symptoms, worsening symptoms, DVT, PE, anesthetic complications, etc.  DESCRIPTION OF PROCEDURE:  The patient in supine position, after induction of adequate spinal anesthesia, 2 grams Kefzol, the left upper extremity was prepped and draped and exsanguinated in usual sterile fashion.  Thigh tourniquet inflated to 225 mmHg.   Midline incision was then made over the knee.  Full thickness flaps developed.  Median parapatellar arthrotomy performed.  Elevated soft tissues medially, preserving the MCL.  Everted the patella gently.  Flexed the knee.  Severe tricompartmental  osteoarthrosis was noted bone-on-bone, particularly in the medial compartment.  Multiple osteophytes were noted and removed with a Leksell rongeur from the femur, tibia and from the patella.  Remnants of medial and lateral menisci were removed as well as  the ACL.  Leksell rongeur utilized to perform a notch just above the femoral notch as starting hole for the femoral drill, which was drilled in line with the femur entering the femoral canal without difficulty.  This was irrigated,  T-handled and I used  an intramedullary guide, 5-degree left with 9 off the distal femur.  I performed this cut.  Then, subluxed the tibia, the low side was medial.  I used external alignment guide throughout the defect, which was medial parallel to the shaft, 3 degrees  slope, bisecting the tibiotalar joint.  This was pinned.  I performed this cut.  I attest them in extension was slightly tight, so I took two additional mm off the femur, used the angel wing, applied the distal femoral jig and then moved it back 2 mm.  I  performed that cut and then I had full extension with a 5 and a 6 mm insert.  I then reflexed the knee, subluxed the tibia, sized the femur off the anterior cortex to be a 7, 3 degrees of external rotation.  This was pinned and we placed our jig and  performed our anterior, posterior and chamfer cuts.  He had very hard bone.  Following this, again I subluxed the tibia, sized the tibia to a 7 maximizing coverage just the medial aspect of tibial tubercle.  This was pinned, harvested bone centrally and  impacted into the distal femur.  I drilled centrally, placed our punch guide.  Turned attention back to the femur, used a box cut guide bisecting the canal, performed a box cut.  Again, very hard bone.  We used the rasp and a Beyer rongeur to complete  this cut.  I placed a trial femur, did not sit flush and removed it, rasped the box in the anterior cortex.  I then re-impacted the femur, it sit flush.  I drilled our lug holes.  I placed a 5 mm insert and reduced the knee, had full flexion, full  extension, good stability to varus and  valgus stress at 0 and 30 degrees, negative anterior drawer.  Slight recurvatum.  I then everted the patella, this was measured to a 25.  We planed it to a 16 utilizing a patellar jig.  This was planed with a  different saw blade due to the patient's eburnated bone.  I then used a 38 patella paddle trial that was parallel to the joint surface medializing our peg  holes.  These were drilled I placed a 38 reduced it and had excellent patellofemoral tracking.   Next, all instrumentation was then removed.  We checked posteriorly any remnants of menisci were excised, cauterized the geniculates.  I used Exparel and injected through the posterior capsule posteromedially, withdrawn without breaking the vacuum and  injecting approximately 15 mL and then injecting the proximal tibia, distal femur periosteum of the femur.  Following this, a pulsatile lavage within the joint thoroughly cleaned all bony surfaces.  Knee was flexed.  All surfaces thoroughly dried.   Cement was mixed on the back table under a central fusion vacuum.  I then placed cement in the proximal tibia, digitally pressurizing it.  Cement was placed on the tibial component and impacted in place with redundant cement removed.  I cemented femoral  component in the femur, impacted into place sit flush.  Redundant cement removed.  I placed a 5 insert, reduced it, held in axial load throughout the curing of the cement, cemented and clamped the patella.  Redundant cement removed.  Marcaine with  epinephrine was placed in the wound during the curing of the cement as was Prontosan.  The wound was covered.  After curing of the cement, approximately 68 minutes, tourniquet was deflated and any minimal bleeding was cauterized.  Following this, I felt  it would be best to use a 6.  The trial was removed and meticulously removed all redundant cement.  Copiously irrigated the joint with low pulsatile lavage.  Followed by Prontosan.  I placed a 6 permanent polyethylene insert, reduced it had full  extension, full flexion, good stability varus and valgus stress at 0 and 30 degrees, negative anterior drawer.  I then in moderate flexion of the knee, I reapproximated patellar arthrotomy with #1 Vicryl in interrupted figure-of-eight sutures.  This was  held together first with towel clips.  Following this, this was oversewn with a  running Stratafix.  Following this, I had excellent patellofemoral tracking.  Negative anterior, posterior drawer or instability.  Copious irrigation subcutaneous tissue and  then closed the subcutaneous with 2-0, closing any voids or defects.  Skin was with staples.  An excellent patellofemoral tracking after closure.  Sterile dressing applied.  The patient was placed supine on the hospital bed after immobilizer placed and  transported to the recovery room in satisfactory condition.  The patient tolerated the procedure well.  No complications.  ASSISTANT:  Andrez Grime, PA, was used throughout the case for patient positioning, traction, cautery and closure.  BLOOD LOSS:  50 mL.   PUS D: 11/17/2022 9:59:57 am T: 11/17/2022 10:24:00 am  JOB: [id number]/ [id number]

## 2022-11-17 NOTE — Plan of Care (Signed)
  Problem: Pain Management: Goal: Pain level will decrease with appropriate interventions Outcome: Progressing   Problem: Nutrition: Goal: Adequate nutrition will be maintained Outcome: Progressing   

## 2022-11-17 NOTE — Brief Op Note (Signed)
11/17/2022  7:16 AM  PATIENT:  Vincent Ray  68 y.o. male  PRE-OPERATIVE DIAGNOSIS:  Left knee degenerative joint disease  POST-OPERATIVE DIAGNOSIS:  * No post-op diagnosis entered *  PROCEDURE:  Procedure(s): TOTAL KNEE ARTHROPLASTY (Left)  SURGEON:  Surgeon(s) and Role:    Jene Every, MD - Primary  PHYSICIAN ASSISTANT:   ASSISTANTS: Bissell   ANESTHESIA:   spinal  EBL:  50  BLOOD ADMINISTERED:none  DRAINS: none   LOCAL MEDICATIONS USED:  MARCAINE     SPECIMEN:  No Specimen  DISPOSITION OF SPECIMEN:  N/A  COUNTS:  YES  TOURNIQUET:  * Missing tourniquet times found for documented tourniquets in log: [id number] *  DICTATION: .Other Dictation: Dictation Number [id number]  PLAN OF CARE: Admit for overnight observation  PATIENT DISPOSITION:  PACU - hemodynamically stable.   Delay start of Pharmacological VTE agent (>24hrs) due to surgical blood loss or risk of bleeding: no

## 2022-11-17 NOTE — Anesthesia Procedure Notes (Signed)
Anesthesia Regional Block: Adductor canal block   Pre-Anesthetic Checklist: , timeout performed,  Correct Patient, Correct Site, Correct Laterality,  Correct Procedure, Correct Position, site marked,  Risks and benefits discussed,  Pre-op evaluation,  At surgeon's request and post-op pain management  Laterality: Left  Prep: Maximum Sterile Barrier Precautions used, chloraprep       Needles:  Injection technique: Single-shot  Needle Type: Echogenic Stimulator Needle     Needle Length: 9cm  Needle Gauge: 21     Additional Needles:   Procedures:,,,, ultrasound used (permanent image in chart),,    Narrative:  Start time: 11/17/2022 7:13 AM End time: 11/17/2022 7:15 AM Injection made incrementally with aspirations every 5 mL. Anesthesiologist: Elmer Picker, MD

## 2022-11-17 NOTE — Anesthesia Procedure Notes (Signed)
Date/Time: 11/17/2022 7:27 AM  Performed by: Florene Route, CRNAOxygen Delivery Method: Simple face mask

## 2022-11-17 NOTE — Anesthesia Procedure Notes (Signed)
Spinal  Patient location during procedure: OR Start time: 11/17/2022 7:27 AM End time: 11/17/2022 7:29 AM Reason for block: surgical anesthesia Staffing Performed: resident/CRNA  Resident/CRNA: Florene Route, CRNA Performed by: Florene Route, CRNA Authorized by: Elmer Picker, MD   Preanesthetic Checklist Completed: patient identified, IV checked, site marked, risks and benefits discussed, surgical consent, monitors and equipment checked, pre-op evaluation and timeout performed Spinal Block Patient position: sitting Prep: DuraPrep and site prepped and draped Patient monitoring: heart rate, cardiac monitor, continuous pulse ox and blood pressure Approach: midline Location: L3-4 Injection technique: single-shot Needle Needle type: Pencan  Needle gauge: 24 G Needle length: 10 cm Assessment Sensory level: T6 Events: CSF return Additional Notes Kit expiration date 09/20/2024 and lot #[id number] Clear free flow of CSF, negative heme, negative paresthesia Tolerated well and returned to supine position

## 2022-11-17 NOTE — Discharge Instructions (Signed)

## 2022-11-17 NOTE — Plan of Care (Signed)

## 2022-11-17 NOTE — Anesthesia Postprocedure Evaluation (Signed)
Anesthesia Post Note  Patient: [name]  Procedure(s) Performed: TOTAL KNEE ARTHROPLASTY (Left: Knee)     Patient location during evaluation: PACU Anesthesia Type: Regional and Spinal Level of consciousness: oriented and awake and alert Pain management: pain level controlled Vital Signs Assessment: post-procedure vital signs reviewed and stable Respiratory status: spontaneous breathing, respiratory function stable and patient connected to nasal cannula oxygen Cardiovascular status: blood pressure returned to baseline and stable Postop Assessment: no headache, no backache, no apparent nausea or vomiting and spinal receding Anesthetic complications: no  No notable events documented.  Last Vitals:  Vitals:   11/17/22 1100 11/17/22 1115  BP: 112/77 111/78  Pulse: 69 72  Resp: 11 12  Temp:  36.7 C  SpO2: 96% 92%    Last Pain:  Vitals:   11/17/22 1115  TempSrc:   PainSc: 0-No pain                 Vincent Ray

## 2022-11-17 NOTE — Interval H&P Note (Signed)
History and Physical Interval Note:  11/17/2022 7:16 AM  Vincent Ray  has presented today for surgery, with the diagnosis of Left knee degenerative joint disease.  The various methods of treatment have been discussed with the patient and family. After consideration of risks, benefits and other options for treatment, the patient has consented to  Procedure(s): TOTAL KNEE ARTHROPLASTY (Left) as a surgical intervention.  The patient's history has been reviewed, patient examined, no change in status, stable for surgery.  I have reviewed the patient's chart and labs.  Questions were answered to the patient's satisfaction.     Javier Docker

## 2022-11-18 ENCOUNTER — Encounter (HOSPITAL_COMMUNITY): Payer: Self-pay | Admitting: Specialist

## 2022-11-18 DIAGNOSIS — M21162 Varus deformity, not elsewhere classified, left knee: Secondary | ICD-10-CM | POA: Diagnosis not present

## 2022-11-18 DIAGNOSIS — I10 Essential (primary) hypertension: Secondary | ICD-10-CM | POA: Diagnosis not present

## 2022-11-18 DIAGNOSIS — M1712 Unilateral primary osteoarthritis, left knee: Secondary | ICD-10-CM | POA: Diagnosis not present

## 2022-11-18 DIAGNOSIS — M25762 Osteophyte, left knee: Secondary | ICD-10-CM | POA: Diagnosis not present

## 2022-11-18 LAB — CBC WITH DIFFERENTIAL/PLATELET
Abs Immature Granulocytes: 0.12 10*3/uL — ABNORMAL HIGH (ref 0.00–0.07)
Basophils Absolute: 0 10*3/uL (ref 0.0–0.1)
Basophils Relative: 0 %
Eosinophils Absolute: 0 10*3/uL (ref 0.0–0.5)
Eosinophils Relative: 0 %
HCT: 38.9 % — ABNORMAL LOW (ref 39.0–52.0)
Hemoglobin: 12.8 g/dL — ABNORMAL LOW (ref 13.0–17.0)
Immature Granulocytes: 1 %
Lymphocytes Relative: 7 %
Lymphs Abs: 1 10*3/uL (ref 0.7–4.0)
MCH: 30.3 pg (ref 26.0–34.0)
MCHC: 32.9 g/dL (ref 30.0–36.0)
MCV: 92 fL (ref 80.0–100.0)
Monocytes Absolute: 1.3 10*3/uL — ABNORMAL HIGH (ref 0.1–1.0)
Monocytes Relative: 8 %
Neutro Abs: 13.3 10*3/uL — ABNORMAL HIGH (ref 1.7–7.7)
Neutrophils Relative %: 84 %
Platelets: 211 10*3/uL (ref 150–400)
RBC: 4.23 MIL/uL (ref 4.22–5.81)
RDW: 14.2 % (ref 11.5–15.5)
WBC: 15.7 10*3/uL — ABNORMAL HIGH (ref 4.0–10.5)
nRBC: 0 % (ref 0.0–0.2)

## 2022-11-18 LAB — BASIC METABOLIC PANEL
Anion gap: 10 (ref 5–15)
BUN: 17 mg/dL (ref 8–23)
CO2: 21 mmol/L — ABNORMAL LOW (ref 22–32)
Calcium: 8.3 mg/dL — ABNORMAL LOW (ref 8.9–10.3)
Chloride: 104 mmol/L (ref 98–111)
Creatinine, Ser: 1.01 mg/dL (ref 0.61–1.24)
GFR, Estimated: >60 mL/min (ref >60)
Glucose, Bld: 133 mg/dL — ABNORMAL HIGH (ref 70–99)
Potassium: 5.2 mmol/L — ABNORMAL HIGH (ref 3.5–5.1)
Sodium: 135 mmol/L (ref 135–145)

## 2022-11-18 MED ORDER — SODIUM CHLORIDE 0.9 % IV SOLN: INTRAVENOUS | Status: DC

## 2022-11-18 NOTE — Progress Notes (Signed)
Subjective: 1 Day Post-Op Procedure(s) (LRB): TOTAL KNEE ARTHROPLASTY (Left) Patient reports pain as 4 on 0-10 scale.   Denies CP or SOB.  Voiding without difficulty. Positive flatus. Objective: Vital signs in last 24 hours: Temp:  [97.6 F (36.4 C)-98.4 F (36.9 C)] 97.9 F (36.6 C) (06/27 1407) Pulse Rate:  [83-94] 93 (06/27 1407) Resp:  [15-19] 17 (06/27 1407) BP: (112-139)/(79-90) 137/82 (06/27 1407) SpO2:  [94 %-97 %] 94 % (06/27 1407)  Intake/Output from previous day: 06/26 0701 - 06/27 0700 In: 2951.2 [P.O.:600; I.V.:1896.2; IV Piggyback:455] Out: 1600 [Urine:1550; Blood:50] Intake/Output this shift: Total I/O In: 183.1 [P.O.:120; I.V.:63.1] Out: -   Recent Labs    11/18/22 0734  HGB 12.8*   Recent Labs    11/18/22 0734  WBC 15.7*  RBC 4.23  HCT 38.9*  PLT 211   Recent Labs    11/18/22 0734  NA 135  K 5.2*  CL 104  CO2 21*  BUN 17  CREATININE 1.01  GLUCOSE 133*  CALCIUM 8.3*   No results for input(s): "LABPT", "INR" in the last 72 hours.  Neurologically intact ABD soft Neurovascular intact Sensation intact distally Intact pulses distally Dorsiflexion/Plantar flexion intact Incision: dressing C/D/I No cellulitis present Compartment soft No DVT  Assessment/Plan:  1 Day Post-Op Procedure(s) (LRB): TOTAL KNEE ARTHROPLASTY (Left) Advance diet Up with therapy   Principal Problem:   DJD (degenerative joint disease) of knee    Anticipated LOS equal to or greater than 2 midnights due to - Age 67 and older with one or more of the following:  - Obesity  - Expected need for hospital services (PT, OT, Nursing) required for safe  discharge  - Anticipated need for postoperative skilled nursing care or inpatient rehab  - Active co-morbidities: None OR   - Unanticipated findings during/Post Surgery: Slow post-op progression: GI, pain control, mobility  - Patient is a high risk of re-admission due to: None   Dorothy Spark 11/18/2022,  @[facility] 

## 2022-11-18 NOTE — Progress Notes (Signed)
Physical Therapy Treatment Patient Details Name: Vincent Ray MRN: [id number] DOB: 06/04/1955 Today's Date: 11/18/2022   History of Present Illness 67 yo male presents to therapy s/p L TKA on 11/17/2022 due to failure of conservative measures. Pt PMH includes but is not limited to: arthritis, HTN, prostate ca, R ankle Fx s/p ORIF, and R TKA.    PT Comments    Pt continues motivated and with noted improvement in activity tolerance, pain control and stability with ambulation.  Pt hopeful for dc home tomorrow.   Recommendations for follow up therapy are one component of a multi-disciplinary discharge planning process, led by the attending physician.  Recommendations may be updated based on patient status, additional functional criteria and insurance authorization.  Follow Up Recommendations       Assistance Recommended at Discharge Intermittent Supervision/Assistance  Patient can return home with the following A little help with walking and/or transfers;A little help with bathing/dressing/bathroom;Assistance with cooking/housework;Assist for transportation;Help with stairs or ramp for entrance   Equipment Recommendations  None recommended by PT    Recommendations for Other Services       Precautions / Restrictions Precautions Precautions: Knee;Fall Restrictions Weight Bearing Restrictions: No Other Position/Activity Restrictions: WBAT     Mobility  Bed Mobility Overal bed mobility: Needs Assistance Bed Mobility: Sit to Supine, Supine to Sit     Supine to sit: Supervision Sit to supine: Min assist   General bed mobility comments: min assist to manage L LE onto bed    Transfers Overall transfer level: Needs assistance Equipment used: Rolling walker (2 wheels) Transfers: Sit to/from Stand Sit to Stand: Min guard           General transfer comment: cues for LE management and use of UEs to self assist    Ambulation/Gait Ambulation/Gait assistance: Min  guard, Supervision Gait Distance (Feet): 95 Feet (and additional 50') Assistive device: Rolling walker (2 wheels) Gait Pattern/deviations: Step-to pattern, Antalgic, Shuffle Gait velocity: decreased     General Gait Details: cues for sequence, posture and position from RW   Stairs Stairs: Yes Stairs assistance: Min assist Stair Management: No rails, Step to pattern, Backwards, Forwards Number of Stairs: 3 General stair comments: single step - twice bkwd and once fwd; cues for sequence   Wheelchair Mobility    Modified Rankin (Stroke Patients Only)       Balance Overall balance assessment: Needs assistance Sitting-balance support: Feet supported Sitting balance-Leahy Scale: Good     Standing balance support: Single extremity supported Standing balance-Leahy Scale: Poor                              Cognition Arousal/Alertness: Awake/alert Behavior During Therapy: WFL for tasks assessed/performed Overall Cognitive Status: Within Functional Limits for tasks assessed                                          Exercises Total Joint Exercises Ankle Circles/Pumps: AROM, Both, 20 reps Quad Sets: AROM, Both, 10 reps, Supine Heel Slides: AAROM, Left, 15 reps, Supine Straight Leg Raises: AAROM, Left, 10 reps, Supine Goniometric ROM: AAROM L knee -5 - 60    General Comments        Pertinent Vitals/Pain Pain Assessment Pain Assessment: 0-10 Pain Score: 4  Pain Location: L  knee Pain Descriptors / Indicators: Aching, Sore Pain Intervention(s): Limited activity  within patient's tolerance, Monitored during session, Patient requesting pain meds-RN notified, Ice applied    Home Living                          Prior Function            PT Goals (current goals can now be found in the care plan section) Acute Rehab PT Goals Patient Stated Goal: to be able to walk again, get back the gym maybe play tennis PT Goal Formulation: With  patient/family Time For Goal Achievement: 12/01/22 Potential to Achieve Goals: Good Progress towards PT goals: Progressing toward goals    Frequency    7X/week      PT Plan Current plan remains appropriate    Co-evaluation              AM-PAC PT "6 Clicks" Mobility   Outcome Measure  Help needed turning from your back to your side while in a flat bed without using bedrails?: A Little Help needed moving from lying on your back to sitting on the side of a flat bed without using bedrails?: A Little Help needed moving to and from a bed to a chair (including a wheelchair)?: A Little Help needed standing up from a chair using your arms (e.g., wheelchair or bedside chair)?: A Little Help needed to walk in hospital room?: A Little Help needed climbing 3-5 steps with a railing? : A Little 6 Click Score: 18    End of Session Equipment Utilized During Treatment: Gait belt Activity Tolerance: Patient tolerated treatment well Patient left: in bed;with call bell/phone within reach;with family/visitor present Nurse Communication: Mobility status PT Visit Diagnosis: Unsteadiness on feet (R26.81);Other abnormalities of gait and mobility (R26.89);Muscle weakness (generalized) (M62.81);Difficulty in walking, not elsewhere classified (R26.2);Pain Pain - Right/Left: Left Pain - part of body: Knee;Leg     Time: 8469-6295 PT Time Calculation (min) (ACUTE ONLY): 30 min  Charges:  $Gait Training: 23-37 mins                     Mauro Kaufmann PT Acute Rehabilitation Services Pager [phone] Office [phone]    [facility] 11/18/2022, 4:38 PM

## 2022-11-18 NOTE — Progress Notes (Signed)
RN left message for call back to review post treatment education/follow up's.

## 2022-11-18 NOTE — Progress Notes (Signed)
Physical Therapy Treatment Patient Details Name: Vincent Ray MRN: [id number] DOB: 07-17-1955 Today's Date: 11/18/2022   History of Present Illness 67 yo male presents to therapy s/p L TKA on 11/17/2022 due to failure of conservative measures. Pt PMH includes but is not limited to: arthritis, HTN, prostate ca, R ankle Fx s/p ORIF, and R TKA.    PT Comments    Pt very cooperative and therex program initiated.  Pt assisted from bed to chair on arrival of breakfast and PT will return shortly for gait training.   Recommendations for follow up therapy are one component of a multi-disciplinary discharge planning process, led by the attending physician.  Recommendations may be updated based on patient status, additional functional criteria and insurance authorization.  Follow Up Recommendations       Assistance Recommended at Discharge Intermittent Supervision/Assistance  Patient can return home with the following A little help with walking and/or transfers;A little help with bathing/dressing/bathroom;Assistance with cooking/housework;Assist for transportation;Help with stairs or ramp for entrance   Equipment Recommendations  None recommended by PT    Recommendations for Other Services       Precautions / Restrictions Precautions Precautions: Knee;Fall Restrictions Weight Bearing Restrictions: No Other Position/Activity Restrictions: WBAT     Mobility  Bed Mobility Overal bed mobility: Needs Assistance Bed Mobility: Supine to Sit     Supine to sit: Min assist     General bed mobility comments: increased time, cues and use of bed rails; assist for L LE management    Transfers Overall transfer level: Needs assistance Equipment used: Rolling walker (2 wheels) Transfers: Sit to/from Stand Sit to Stand: Min guard           General transfer comment: cues for proper UE placement    Ambulation/Gait Ambulation/Gait assistance: Min guard Gait Distance (Feet): 5  Feet Assistive device: Rolling walker (2 wheels) Gait Pattern/deviations: Step-to pattern, Antalgic, Trunk flexed Gait velocity: decreased     General Gait Details: cues for sequence, posture and position from [employer]             Wheelchair Mobility    Modified Rankin (Stroke Patients Only)       Balance Overall balance assessment: Needs assistance Sitting-balance support: Feet supported Sitting balance-Leahy Scale: Good     Standing balance support: Single extremity supported Standing balance-Leahy Scale: Poor                              Cognition Arousal/Alertness: Awake/alert Behavior During Therapy: WFL for tasks assessed/performed Overall Cognitive Status: Within Functional Limits for tasks assessed                                          Exercises Total Joint Exercises Ankle Circles/Pumps: AROM, Both, 20 reps Quad Sets: AROM, Both, 10 reps, Supine Heel Slides: AAROM, Left, 15 reps, Supine Straight Leg Raises: AAROM, Left, 10 reps, Supine Goniometric ROM: AAROM L knee -5 - 60    General Comments        Pertinent Vitals/Pain Pain Assessment Pain Assessment: 0-10 Pain Score: 4  Pain Location: L  knee Pain Descriptors / Indicators: Aching, Sore Pain Intervention(s): Limited activity within patient's tolerance, Monitored during session, Premedicated before session, Ice applied    Home Living  Prior Function            PT Goals (current goals can now be found in the care plan section) Acute Rehab PT Goals Patient Stated Goal: to be able to walk again, get back the gym maybe play tennis PT Goal Formulation: With patient/family Time For Goal Achievement: 12/01/22 Potential to Achieve Goals: Good Progress towards PT goals: Progressing toward goals    Frequency    7X/week      PT Plan Current plan remains appropriate    Co-evaluation              AM-PAC PT "6  Clicks" Mobility   Outcome Measure  Help needed turning from your back to your side while in a flat bed without using bedrails?: A Little Help needed moving from lying on your back to sitting on the side of a flat bed without using bedrails?: A Little Help needed moving to and from a bed to a chair (including a wheelchair)?: A Little Help needed standing up from a chair using your arms (e.g., wheelchair or bedside chair)?: A Little Help needed to walk in hospital room?: A Little Help needed climbing 3-5 steps with a railing? : A Lot 6 Click Score: 17    End of Session Equipment Utilized During Treatment: Gait belt Activity Tolerance: Patient tolerated treatment well Patient left: in chair;with chair alarm set;with family/visitor present;with call bell/phone within reach Nurse Communication: Mobility status PT Visit Diagnosis: Unsteadiness on feet (R26.81);Other abnormalities of gait and mobility (R26.89);Muscle weakness (generalized) (M62.81);Difficulty in walking, not elsewhere classified (R26.2);Pain Pain - Right/Left: Left Pain - part of body: Knee;Leg     Time: 5284-1324 PT Time Calculation (min) (ACUTE ONLY): 25 min  Charges:  $Gait Training: 8-22 mins $Therapeutic Activity: 8-22 mins                     Mauro Kaufmann PT Acute Rehabilitation Services Pager [phone] Office [phone]    Jeidy Hoerner 11/18/2022, 10:52 AM

## 2022-11-18 NOTE — TOC Transition Note (Signed)
Transition of Care [facility]) - CM/SW Discharge Note   Patient Details  Name: Vincent Ray MRN: [id number] Date of Birth: 07-24-1955  [facility] of [facility]) CM/SW Contact:  Amada Jupiter, LCSW Phone Number: 11/18/2022, 3:43 PM   Clinical Narrative:     Met with pt and wife who confirm pt has needed DME in the home.  OPPT already arranged.  No TOC needs.  Final next level of care: OP Rehab Barriers to Discharge: No Barriers Identified   Patient Goals and CMS Choice      Discharge Placement                         Discharge Plan and Services Additional resources added to the After Visit Summary for                  DME Arranged: N/A DME Agency: NA                  Social Determinants of Health (SDOH) Interventions SDOH Screenings   Food Insecurity: No Food Insecurity (11/17/2022)  Housing: Low Risk  (11/17/2022)  Transportation Needs: No Transportation Needs (11/17/2022)  Utilities: Not At Risk (11/17/2022)  Depression (PHQ2-9): Low Risk  (01/14/2022)  Tobacco Use: Low Risk  (11/18/2022)     Readmission Risk Interventions     No data to display

## 2022-11-18 NOTE — Progress Notes (Signed)
Subjective: 1 Day Post-Op Procedure(s) (LRB): TOTAL KNEE ARTHROPLASTY (Left) Patient reports pain as 4 on 0-10 scale.   Denies CP or SOB.  Voiding without difficulty. Positive flatus. Objective: Vital signs in last 24 hours: Temp:  [97.5 F (36.4 C)-98.4 F (36.9 C)] 97.6 F (36.4 C) (06/27 0054) Pulse Rate:  [69-94] 94 (06/27 0544) Resp:  [11-19] 18 (06/27 0544) BP: (102-139)/(63-90) 139/84 (06/27 0544) SpO2:  [91 %-97 %] 97 % (06/27 0544)  Intake/Output from previous day: 06/26 0701 - 06/27 0700 In: 2951.2 [P.O.:600; I.V.:1896.2; IV Piggyback:455] Out: 1600 [Urine:1550; Blood:50] Intake/Output this shift: No intake/output data recorded.  No results for input(s): "HGB" in the last 72 hours. No results for input(s): "WBC", "RBC", "HCT", "PLT" in the last 72 hours. No results for input(s): "NA", "K", "CL", "CO2", "BUN", "CREATININE", "GLUCOSE", "CALCIUM" in the last 72 hours. No results for input(s): "LABPT", "INR" in the last 72 hours.  Neurologically intact ABD soft Neurovascular intact Sensation intact distally Intact pulses distally Dorsiflexion/Plantar flexion intact Incision: dressing C/D/I No cellulitis present Compartment soft No DVT  Assessment/Plan:  1 Day Post-Op Procedure(s) (LRB): TOTAL KNEE ARTHROPLASTY (Left) Advance diet Up with therapy   Principal Problem:   DJD (degenerative joint disease) of knee      Javier Docker 11/18/2022, @[facility] 

## 2022-11-18 NOTE — Progress Notes (Signed)
Physical Therapy Treatment Patient Details Name: Vincent Ray MRN: [id number] DOB: 1955/09/20 Today's Date: 11/18/2022   History of Present Illness 67 yo male presents to therapy s/p L TKA on 11/17/2022 due to failure of conservative measures. Pt PMH includes but is not limited to: arthritis, HTN, prostate ca, R ankle Fx s/p ORIF, and R TKA.    PT Comments    Pt very cooperative and up to ambulate increased distance in hall with improved stability and pain control.   Recommendations for follow up therapy are one component of a multi-disciplinary discharge planning process, led by the attending physician.  Recommendations may be updated based on patient status, additional functional criteria and insurance authorization.  Follow Up Recommendations       Assistance Recommended at Discharge Intermittent Supervision/Assistance  Patient can return home with the following A little help with walking and/or transfers;A little help with bathing/dressing/bathroom;Assistance with cooking/housework;Assist for transportation;Help with stairs or ramp for entrance   Equipment Recommendations  None recommended by PT    Recommendations for Other Services       Precautions / Restrictions Precautions Precautions: Knee;Fall Restrictions Weight Bearing Restrictions: No Other Position/Activity Restrictions: WBAT     Mobility  Bed Mobility Overal bed mobility: Needs Assistance Bed Mobility: Sit to Supine     Supine to sit: Min assist Sit to supine: Min assist   General bed mobility comments: min assist to manage L LE onto bed    Transfers Overall transfer level: Needs assistance Equipment used: Rolling walker (2 wheels) Transfers: Sit to/from Stand Sit to Stand: Min guard           General transfer comment: cues for proper UE placement    Ambulation/Gait Ambulation/Gait assistance: Min guard Gait Distance (Feet): 95 Feet Assistive device: Rolling walker (2  wheels) Gait Pattern/deviations: Step-to pattern, Antalgic, Shuffle Gait velocity: decreased     General Gait Details: cues for sequence, posture and position from [employer]             Wheelchair Mobility    Modified Rankin (Stroke Patients Only)       Balance Overall balance assessment: Needs assistance Sitting-balance support: Feet supported Sitting balance-Leahy Scale: Good     Standing balance support: Single extremity supported Standing balance-Leahy Scale: Poor                              Cognition Arousal/Alertness: Awake/alert Behavior During Therapy: WFL for tasks assessed/performed Overall Cognitive Status: Within Functional Limits for tasks assessed                                          Exercises Total Joint Exercises Ankle Circles/Pumps: AROM, Both, 20 reps Quad Sets: AROM, Both, 10 reps, Supine Heel Slides: AAROM, Left, 15 reps, Supine Straight Leg Raises: AAROM, Left, 10 reps, Supine Goniometric ROM: AAROM L knee -5 - 60    General Comments        Pertinent Vitals/Pain Pain Assessment Pain Assessment: 0-10 Pain Score: 6  Pain Location: L  knee Pain Descriptors / Indicators: Aching, Sore Pain Intervention(s): Limited activity within patient's tolerance, Monitored during session, Patient requesting pain meds-RN notified, Ice applied    Home Living  Prior Function            PT Goals (current goals can now be found in the care plan section) Acute Rehab PT Goals Patient Stated Goal: to be able to walk again, get back the gym maybe play tennis PT Goal Formulation: With patient/family Time For Goal Achievement: 12/01/22 Potential to Achieve Goals: Good Progress towards PT goals: Progressing toward goals    Frequency    7X/week      PT Plan Current plan remains appropriate    Co-evaluation              AM-PAC PT "6 Clicks" Mobility   Outcome  Measure  Help needed turning from your back to your side while in a flat bed without using bedrails?: A Little Help needed moving from lying on your back to sitting on the side of a flat bed without using bedrails?: A Little Help needed moving to and from a bed to a chair (including a wheelchair)?: A Little Help needed standing up from a chair using your arms (e.g., wheelchair or bedside chair)?: A Little Help needed to walk in hospital room?: A Little Help needed climbing 3-5 steps with a railing? : A Lot 6 Click Score: 17    End of Session Equipment Utilized During Treatment: Gait belt Activity Tolerance: Patient tolerated treatment well Patient left: in bed;with call bell/phone within reach;with family/visitor present Nurse Communication: Mobility status PT Visit Diagnosis: Unsteadiness on feet (R26.81);Other abnormalities of gait and mobility (R26.89);Muscle weakness (generalized) (M62.81);Difficulty in walking, not elsewhere classified (R26.2);Pain Pain - Right/Left: Left Pain - part of body: Knee;Leg     Time: 4098-1191 PT Time Calculation (min) (ACUTE ONLY): 24 min  Charges:  $Gait Training: 8-22 mins $Therapeutic Activity: 8-22 mins                     Mauro Kaufmann PT Acute Rehabilitation Services Pager [phone] Office [phone]    Rainey Rodger 11/18/2022, 12:17 PM

## 2022-11-18 NOTE — Plan of Care (Signed)
  Problem: Activity: Goal: Ability to avoid complications of mobility impairment will improve Outcome: Progressing   Problem: Clinical Measurements: Goal: Postoperative complications will be avoided or minimized Outcome: Progressing   Problem: Pain Management: Goal: Pain level will decrease with appropriate interventions Outcome: Progressing   

## 2022-11-19 DIAGNOSIS — I10 Essential (primary) hypertension: Secondary | ICD-10-CM | POA: Diagnosis not present

## 2022-11-19 DIAGNOSIS — M21162 Varus deformity, not elsewhere classified, left knee: Secondary | ICD-10-CM | POA: Diagnosis not present

## 2022-11-19 DIAGNOSIS — M25762 Osteophyte, left knee: Secondary | ICD-10-CM | POA: Diagnosis not present

## 2022-11-19 DIAGNOSIS — M1712 Unilateral primary osteoarthritis, left knee: Secondary | ICD-10-CM | POA: Diagnosis not present

## 2022-11-19 LAB — CBC
HCT: 35.1 % — ABNORMAL LOW (ref 39.0–52.0)
Hemoglobin: 11.7 g/dL — ABNORMAL LOW (ref 13.0–17.0)
MCH: 30.6 pg (ref 26.0–34.0)
MCHC: 33.3 g/dL (ref 30.0–36.0)
MCV: 91.9 fL (ref 80.0–100.0)
Platelets: 208 10*3/uL (ref 150–400)
RBC: 3.82 MIL/uL — ABNORMAL LOW (ref 4.22–5.81)
RDW: 14.2 % (ref 11.5–15.5)
WBC: 8.9 10*3/uL (ref 4.0–10.5)
nRBC: 0 % (ref 0.0–0.2)

## 2022-11-19 MED ORDER — HYDROMORPHONE HCL 2 MG PO TABS: 2.0000 mg | ORAL_TABLET | ORAL | 0 refills | Status: DC | PRN

## 2022-11-19 MED ORDER — HYDROMORPHONE HCL 4 MG PO TABS: 4.0000 mg | ORAL_TABLET | ORAL | 0 refills | Status: DC | PRN

## 2022-11-19 NOTE — Progress Notes (Signed)
Subjective: 2 Days Post-Op Procedure(s) (LRB): TOTAL KNEE ARTHROPLASTY (Left) Patient reports pain as 3 on 0-10 scale.   Denies CP or SOB.  Voiding without difficulty. Positive flatus. Objective: Vital signs in last 24 hours: Temp:  [97.8 F (36.6 C)-98.7 F (37.1 C)] 98.2 F (36.8 C) (06/28 0431) Pulse Rate:  [93-103] 98 (06/28 0431) Resp:  [17-18] 18 (06/28 0431) BP: (129-153)/(76-86) 133/78 (06/28 0431) SpO2:  [89 %-95 %] 89 % (06/28 0431)  Intake/Output from previous day: 06/27 0701 - 06/28 0700 In: 238.2 [P.O.:120; I.V.:63.2; IV Piggyback:55] Out: -  Intake/Output this shift: Total I/O In: 240 [P.O.:240] Out: 500 [Urine:500]  Recent Labs    11/18/22 0734 11/19/22 0319  HGB 12.8* 11.7*   Recent Labs    11/18/22 0734 11/19/22 0319  WBC 15.7* 8.9  RBC 4.23 3.82*  HCT 38.9* 35.1*  PLT 211 208   Recent Labs    11/18/22 0734  NA 135  K 5.2*  CL 104  CO2 21*  BUN 17  CREATININE 1.01  GLUCOSE 133*  CALCIUM 8.3*   No results for input(s): "LABPT", "INR" in the last 72 hours.  Neurologically intact ABD soft Neurovascular intact Sensation intact distally Intact pulses distally Dorsiflexion/Plantar flexion intact Incision: dressing C/D/I Compartment soft No DVT  Assessment/Plan:  2 Days Post-Op Procedure(s) (LRB): TOTAL KNEE ARTHROPLASTY (Left) Plan for discharge tomorrow Discharge home with home health   Principal Problem:   DJD (degenerative joint disease) of knee      Vincent Ray 11/19/2022, @[facility] 

## 2022-11-19 NOTE — Progress Notes (Signed)
Physical Therapy Treatment Patient Details Name: Vincent Ray MRN: [id number] DOB: 1956-03-07 Today's Date: 11/19/2022   History of Present Illness 67 yo male presents to therapy s/p L TKA on 11/17/2022 due to failure of conservative measures. Pt PMH includes but is not limited to: arthritis, HTN, prostate ca, R ankle Fx s/p ORIF, and R TKA.    PT Comments    Pt continues very cooperative and progressing well with mobility but requiring increased time for most tasks.  Pt up to ambulate in hall, performed HEP with written instruction provided and reviewed, reviewed car transfers and reviewed don/doff KI.  Pt eager for dc home this date.   Recommendations for follow up therapy are one component of a multi-disciplinary discharge planning process, led by the attending physician.  Recommendations may be updated based on patient status, additional functional criteria and insurance authorization.  Follow Up Recommendations       Assistance Recommended at Discharge Intermittent Supervision/Assistance  Patient can return home with the following A little help with walking and/or transfers;A little help with bathing/dressing/bathroom;Assistance with cooking/housework;Assist for transportation;Help with stairs or ramp for entrance   Equipment Recommendations  None recommended by PT    Recommendations for Other Services       Precautions / Restrictions Precautions Precautions: Knee;Fall Restrictions Weight Bearing Restrictions: No LLE Weight Bearing: Weight bearing as tolerated Other Position/Activity Restrictions: WBAT     Mobility  Bed Mobility Overal bed mobility: Needs Assistance Bed Mobility: Supine to Sit, Sit to Supine     Supine to sit: Supervision Sit to supine: Supervision   General bed mobility comments: Increased time with cues for sequence    Transfers Overall transfer level: Needs assistance Equipment used: Rolling walker (2 wheels) Transfers: Sit to/from  Stand Sit to Stand: Supervision           General transfer comment: cues for LE management and use of UEs to self assist    Ambulation/Gait Ambulation/Gait assistance: Min guard, Supervision Gait Distance (Feet): 111 Feet Assistive device: Rolling walker (2 wheels) Gait Pattern/deviations: Step-to pattern, Antalgic, Shuffle Gait velocity: decreased     General Gait Details: min cues for sequence, posture and position from RW   Stairs         General stair comments: Pt states comfortable with ability to do stairs after training yesterday; written instruction provided   Wheelchair Mobility    Modified Rankin (Stroke Patients Only)       Balance Overall balance assessment: Needs assistance Sitting-balance support: Feet supported Sitting balance-Leahy Scale: Good     Standing balance support: No upper extremity supported Standing balance-Leahy Scale: Fair                              Cognition Arousal/Alertness: Awake/alert Behavior During Therapy: WFL for tasks assessed/performed Overall Cognitive Status: Within Functional Limits for tasks assessed                                          Exercises Total Joint Exercises Ankle Circles/Pumps: AROM, Both, 20 reps Quad Sets: AROM, Both, 10 reps, Supine Heel Slides: AAROM, Left, 15 reps, Supine Straight Leg Raises: AAROM, Left, Supine, 15 reps Goniometric ROM: -5 - 60  AAROM    General Comments        Pertinent Vitals/Pain Pain Assessment Pain Assessment: 0-10 Pain Score: 4  Pain Location: L  knee Pain Descriptors / Indicators: Aching, Sore Pain Intervention(s): Limited activity within patient's tolerance, Monitored during session, Premedicated before session, Ice applied    Home Living                          Prior Function            PT Goals (current goals can now be found in the care plan section) Acute Rehab PT Goals Patient Stated Goal: to be able  to walk again, get back the gym maybe play tennis PT Goal Formulation: With patient/family Time For Goal Achievement: 12/01/22 Potential to Achieve Goals: Good Progress towards PT goals: Progressing toward goals    Frequency    7X/week      PT Plan Current plan remains appropriate    Co-evaluation              AM-PAC PT "6 Clicks" Mobility   Outcome Measure  Help needed turning from your back to your side while in a flat bed without using bedrails?: A Little Help needed moving from lying on your back to sitting on the side of a flat bed without using bedrails?: A Little Help needed moving to and from a bed to a chair (including a wheelchair)?: A Little Help needed standing up from a chair using your arms (e.g., wheelchair or bedside chair)?: A Little Help needed to walk in hospital room?: A Little Help needed climbing 3-5 steps with a railing? : A Little 6 Click Score: 18    End of Session Equipment Utilized During Treatment: Gait belt Activity Tolerance: Patient tolerated treatment well Patient left: in bed;with call bell/phone within reach;with family/visitor present Nurse Communication: Mobility status PT Visit Diagnosis: Unsteadiness on feet (R26.81);Other abnormalities of gait and mobility (R26.89);Muscle weakness (generalized) (M62.81);Difficulty in walking, not elsewhere classified (R26.2);Pain Pain - Right/Left: Left Pain - part of body: Knee;Leg     Time: 1610-9604 PT Time Calculation (min) (ACUTE ONLY): 54 min  Charges:  $Gait Training: 8-22 mins $Therapeutic Exercise: 8-22 mins $Therapeutic Activity: 8-22 mins                     Vincent Ray PT Acute Rehabilitation Services Pager [phone] Office [phone]    Vincent Ray 11/19/2022, 12:11 PM

## 2022-11-19 NOTE — Discharge Summary (Signed)
Physician Discharge Summary   Patient ID: Vincent Ray MRN: [id number] DOB/AGE: 1956/04/06 67 y.o.  Admit date: 11/17/2022 Discharge date: 11/19/22  Primary Diagnosis: left knee primary osteoarthritis  Admission Diagnoses:  Past Medical History:  Diagnosis Date   Arthritis    Asthma    As a child no problems now   Hallux rigidus of right foot    Hypertension    prostate cancer    Wears glasses    Discharge Diagnoses:   Principal Problem:   DJD (degenerative joint disease) of knee  Estimated body mass index is 29.37 kg/m as calculated from the following:   Height as of this encounter: 6\' 3"  (1.905 m).   Weight as of this encounter: 106.6 kg.  Procedure:  Procedure(s) (LRB): TOTAL KNEE ARTHROPLASTY (Left)   Consults: None  HPI: see H&P Laboratory Data: Admission on 11/17/2022  Component Date Value Ref Range Status   Sodium 11/18/2022 135  135 - 145 mmol/L Final   Potassium 11/18/2022 5.2 (H)  3.5 - 5.1 mmol/L Final   HEMOLYSIS AT THIS LEVEL MAY AFFECT RESULT   Chloride 11/18/2022 104  98 - 111 mmol/L Final   CO2 11/18/2022 21 (L)  22 - 32 mmol/L Final   Glucose, Bld 11/18/2022 133 (H)  70 - 99 mg/dL Final   Glucose reference range applies only to samples taken after fasting for at least 8 hours.   BUN 11/18/2022 17  8 - 23 mg/dL Final   Creatinine, Ser 11/18/2022 1.01  0.61 - 1.24 mg/dL Final   Calcium 40/98/1191 8.3 (L)  8.9 - 10.3 mg/dL Final   GFR, Estimated 11/18/2022 >60  >60 mL/min Final   Comment: (NOTE) Calculated using the CKD-EPI Creatinine Equation (2021)    Anion gap 11/18/2022 10  5 - 15 Final   Performed at [facility], [address] [place], [place] [address]   WBC 11/18/2022 15.7 (H)  4.0 - 10.5 K/uL Final   RBC 11/18/2022 4.23  4.22 - 5.81 MIL/uL Final   Hemoglobin 11/18/2022 12.8 (L)  13.0 - 17.0 g/dL Final   HCT 56/21/3086 38.9 (L)  39.0 - 52.0 % Final   MCV 11/18/2022 92.0  80.0 - 100.0 fL Final   MCH  11/18/2022 30.3  26.0 - 34.0 pg Final   MCHC 11/18/2022 32.9  30.0 - 36.0 g/dL Final   RDW 57/84/6962 14.2  11.5 - 15.5 % Final   Platelets 11/18/2022 211  150 - 400 K/uL Final   nRBC 11/18/2022 0.0  0.0 - 0.2 % Final   Neutrophils Relative % 11/18/2022 84  % Final   Neutro Abs 11/18/2022 13.3 (H)  1.7 - 7.7 K/uL Final   Lymphocytes Relative 11/18/2022 7  % Final   Lymphs Abs 11/18/2022 1.0  0.7 - 4.0 K/uL Final   Monocytes Relative 11/18/2022 8  % Final   Monocytes Absolute 11/18/2022 1.3 (H)  0.1 - 1.0 K/uL Final   Eosinophils Relative 11/18/2022 0  % Final   Eosinophils Absolute 11/18/2022 0.0  0.0 - 0.5 K/uL Final   Basophils Relative 11/18/2022 0  % Final   Basophils Absolute 11/18/2022 0.0  0.0 - 0.1 K/uL Final   Immature Granulocytes 11/18/2022 1  % Final   Abs Immature Granulocytes 11/18/2022 0.12 (H)  0.00 - 0.07 K/uL Final   Performed at [facility], [address], [place], [place] [address]   WBC 11/19/2022 8.9  4.0 - 10.5 K/uL Final   RBC 11/19/2022 3.82 (L)  4.22 - 5.81 MIL/uL Final   Hemoglobin 11/19/2022 11.7 (L)  13.0 - 17.0 g/dL Final   HCT 57/84/6962 35.1 (L)  39.0 - 52.0 % Final   MCV 11/19/2022 91.9  80.0 - 100.0 fL Final   MCH 11/19/2022 30.6  26.0 - 34.0 pg Final   MCHC 11/19/2022 33.3  30.0 - 36.0 g/dL Final   RDW 95/28/4132 14.2  11.5 - 15.5 % Final   Platelets 11/19/2022 208  150 - 400 K/uL Final   nRBC 11/19/2022 0.0  0.0 - 0.2 % Final   Performed at [facility], [address], [place], [place] [address]  Orders Only on 11/11/2022  Component Date Value Ref Range Status   Course ID 11/11/2022 [email or web address]   Final   Course Intent 11/11/2022 Curative   Final   Course Start Date 11/11/2022 09/17/2022  9:28 AM   Final   Session Number 11/11/2022 28   Final   Course First Treatment Date 11/11/2022 10/04/2022  1:03 PM   Final   Course Last Treatment Date 11/11/2022 11/11/2022  1:31 PM   Final   Course Elapsed Days  11/11/2022 38   Final   Reference Point ID 11/11/2022 Prostate dp   Final   Reference Point Dosage Given to Da* 11/11/2022 70  Gy Final   Reference Point Session Dosage Giv* 11/11/2022 2.5  Gy Final   Plan ID 11/11/2022 Prostate   Final   Plan Name 11/11/2022 simprostate   Final   Plan Fractions Treated to Date 11/11/2022 28   Final   Plan Total Fractions Prescribed 11/11/2022 28   Final   Plan Prescribed Dose Per Fraction 11/11/2022 2.5  Gy Final   Plan Total Prescribed Dose 11/11/2022 70.000000  Gy Final   Plan Primary Reference Point 11/11/2022 Prostate dp   Final  Orders Only on 11/10/2022  Component Date Value Ref Range Status   Course ID 11/10/2022 [email or web address]   Final   Course Intent 11/10/2022 Curative   Final   Course Start Date 11/10/2022 09/17/2022  9:28 AM   Final   Session Number 11/10/2022 27   Final   Course First Treatment Date 11/10/2022 10/04/2022  1:03 PM   Final   Course Last Treatment Date 11/10/2022 11/10/2022  1:34 PM   Final   Course Elapsed Days 11/10/2022 37   Final   Reference Point ID 11/10/2022 Prostate dp   Final   Reference Point Dosage Given to Da* 11/10/2022 67.5  Gy Final   Reference Point Session Dosage Giv* 11/10/2022 2.5  Gy Final   Plan ID 11/10/2022 Prostate   Final   Plan Name 11/10/2022 simprostate   Final   Plan Fractions Treated to Date 11/10/2022 27   Final   Plan Total Fractions Prescribed 11/10/2022 28   Final   Plan Prescribed Dose Per Fraction 11/10/2022 2.5  Gy Final   Plan Total Prescribed Dose 11/10/2022 70.000000  Gy Final   Plan Primary Reference Point 11/10/2022 Prostate dp   Final  Orders Only on 11/09/2022  Component Date Value Ref Range Status   Course ID 11/09/2022 [email or web address]   Final   Course Intent 11/09/2022 Curative   Final   Course Start Date 11/09/2022 09/17/2022  9:28 AM   Final   Session Number 11/09/2022 26   Final   Course First Treatment Date 11/09/2022 10/04/2022  1:03 PM   Final   Course Last Treatment Date  11/09/2022 11/09/2022  1:25 PM   Final   Course Elapsed  Days 11/09/2022 36   Final   Reference Point ID 11/09/2022 Prostate dp   Final   Reference Point Dosage Given to Da* 11/09/2022 65  Gy Final   Reference Point Session Dosage Giv* 11/09/2022 2.5  Gy Final   Plan ID 11/09/2022 Prostate   Final   Plan Name 11/09/2022 simprostate   Final   Plan Fractions Treated to Date 11/09/2022 26   Final   Plan Total Fractions Prescribed 11/09/2022 28   Final   Plan Prescribed Dose Per Fraction 11/09/2022 2.5  Gy Final   Plan Total Prescribed Dose 11/09/2022 70.000000  Gy Final   Plan Primary Reference Point 11/09/2022 Prostate dp   Final  Hospital Outpatient Visit on 11/09/2022  Component Date Value Ref Range Status   MRSA, PCR 11/09/2022 NEGATIVE  NEGATIVE Final   Staphylococcus aureus 11/09/2022 NEGATIVE  NEGATIVE Final   Comment: (NOTE) The Xpert SA Assay (FDA approved for NASAL specimens in patients 63 years of age and older), is one component of a comprehensive surveillance program. It is not intended to diagnose infection nor to guide or monitor treatment. Performed at [facility], [address] [place], [place] [address]    Sodium 11/09/2022 138  135 - 145 mmol/L Final   Potassium 11/09/2022 3.8  3.5 - 5.1 mmol/L Final   Chloride 11/09/2022 107  98 - 111 mmol/L Final   CO2 11/09/2022 23  22 - 32 mmol/L Final   Glucose, Bld 11/09/2022 99  70 - 99 mg/dL Final   Glucose reference range applies only to samples taken after fasting for at least 8 hours.   BUN 11/09/2022 13  8 - 23 mg/dL Final   Creatinine, Ser 11/09/2022 1.34 (H)  0.61 - 1.24 mg/dL Final   Calcium 53/66/4403 8.9  8.9 - 10.3 mg/dL Final   GFR, Estimated 11/09/2022 59 (L)  >60 mL/min Final   Comment: (NOTE) Calculated using the CKD-EPI Creatinine Equation (2021)    Anion gap 11/09/2022 8  5 - 15 Final   Performed at [facility], [address], [place], [place] [address]   WBC  11/09/2022 3.4 (L)  4.0 - 10.5 K/uL Final   RBC 11/09/2022 4.99  4.22 - 5.81 MIL/uL Final   Hemoglobin 11/09/2022 15.3  13.0 - 17.0 g/dL Final   HCT 95/63/8756 45.2  39.0 - 52.0 % Final   MCV 11/09/2022 90.6  80.0 - 100.0 fL Final   MCH 11/09/2022 30.7  26.0 - 34.0 pg Final   MCHC 11/09/2022 33.8  30.0 - 36.0 g/dL Final   RDW 43/32/9518 13.9  11.5 - 15.5 % Final   Platelets 11/09/2022 210  150 - 400 K/uL Final   nRBC 11/09/2022 0.0  0.0 - 0.2 % Final   Performed at [facility], [address] [place], [place] [address]  Orders Only on 11/08/2022  Component Date Value Ref Range Status   Course ID 11/08/2022 [email or web address]   Final   Course Intent 11/08/2022 Curative   Final   Course Start Date 11/08/2022 09/17/2022  9:28 AM   Final   Session Number 11/08/2022 25   Final   Course First Treatment Date 11/08/2022 10/04/2022  1:03 PM   Final   Course Last Treatment Date 11/08/2022 11/08/2022  1:29 PM   Final   Course Elapsed Days 11/08/2022 35   Final   Reference Point ID 11/08/2022 Prostate dp   Final   Reference Point Dosage Given to Da* 11/08/2022 62.5  Gy Final   Reference Point Session Dosage Giv* 11/08/2022 2.5  Gy Final   Plan ID 11/08/2022 Prostate   Final   Plan Name 11/08/2022 simprostate   Final   Plan Fractions Treated to Date 11/08/2022 25   Final   Plan Total Fractions Prescribed 11/08/2022 28   Final   Plan Prescribed Dose Per Fraction 11/08/2022 2.5  Gy Final   Plan Total Prescribed Dose 11/08/2022 70.000000  Gy Final   Plan Primary Reference Point 11/08/2022 Prostate dp   Final  Orders Only on 11/05/2022  Component Date Value Ref Range Status   Course ID 11/05/2022 [email or web address]   Final   Course Intent 11/05/2022 Curative   Final   Course Start Date 11/05/2022 09/17/2022  9:28 AM   Final   Session Number 11/05/2022 24   Final   Course First Treatment Date 11/05/2022 10/04/2022  1:03 PM   Final   Course Last Treatment Date 11/05/2022 11/05/2022  1:29 PM    Final   Course Elapsed Days 11/05/2022 32   Final   Reference Point ID 11/05/2022 Prostate dp   Final   Reference Point Dosage Given to Da* 11/05/2022 60  Gy Final   Reference Point Session Dosage Giv* 11/05/2022 2.5  Gy Final   Plan ID 11/05/2022 Prostate   Final   Plan Name 11/05/2022 simprostate   Final   Plan Fractions Treated to Date 11/05/2022 24   Final   Plan Total Fractions Prescribed 11/05/2022 28   Final   Plan Prescribed Dose Per Fraction 11/05/2022 2.5  Gy Final   Plan Total Prescribed Dose 11/05/2022 70.000000  Gy Final   Plan Primary Reference Point 11/05/2022 Prostate dp   Final  Orders Only on 11/04/2022  Component Date Value Ref Range Status   Course ID 11/04/2022 [email or web address]   Final   Course Intent 11/04/2022 Curative   Final   Course Start Date 11/04/2022 09/17/2022  9:28 AM   Final   Session Number 11/04/2022 23   Final   Course First Treatment Date 11/04/2022 10/04/2022  1:03 PM   Final   Course Last Treatment Date 11/04/2022 11/04/2022  1:32 PM   Final   Course Elapsed Days 11/04/2022 31   Final   Reference Point ID 11/04/2022 Prostate dp   Final   Reference Point Dosage Given to Da* 11/04/2022 57.5  Gy Final   Reference Point Session Dosage Giv* 11/04/2022 2.5  Gy Final   Plan ID 11/04/2022 Prostate   Final   Plan Name 11/04/2022 simprostate   Final   Plan Fractions Treated to Date 11/04/2022 23   Final   Plan Total Fractions Prescribed 11/04/2022 28   Final   Plan Prescribed Dose Per Fraction 11/04/2022 2.5  Gy Final   Plan Total Prescribed Dose 11/04/2022 70.000000  Gy Final   Plan Primary Reference Point 11/04/2022 Prostate dp   Final  Orders Only on 11/03/2022  Component Date Value Ref Range Status   Course ID 11/03/2022 [email or web address]   Final   Course Intent 11/03/2022 Curative   Final   Course Start Date 11/03/2022 09/17/2022  9:28 AM   Final   Session Number 11/03/2022 22   Final   Course First Treatment Date 11/03/2022 10/04/2022  1:03 PM   Final   Course  Last Treatment Date 11/03/2022 11/03/2022  1:41 PM   Final   Course Elapsed Days 11/03/2022 30   Final   Reference Point ID 11/03/2022 Prostate dp   Final   Reference  Point Dosage Given to Da* 11/03/2022 55  Gy Final   Reference Point Session Dosage Giv* 11/03/2022 2.5  Gy Final   Plan ID 11/03/2022 Prostate   Final   Plan Name 11/03/2022 simprostate   Final   Plan Fractions Treated to Date 11/03/2022 22   Final   Plan Total Fractions Prescribed 11/03/2022 28   Final   Plan Prescribed Dose Per Fraction 11/03/2022 2.5  Gy Final   Plan Total Prescribed Dose 11/03/2022 70.000000  Gy Final   Plan Primary Reference Point 11/03/2022 Prostate dp   Final  Orders Only on 11/02/2022  Component Date Value Ref Range Status   Course ID 11/02/2022 [email or web address]   Final   Course Intent 11/02/2022 Curative   Final   Course Start Date 11/02/2022 09/17/2022  9:28 AM   Final   Session Number 11/02/2022 21   Final   Course First Treatment Date 11/02/2022 10/04/2022  1:03 PM   Final   Course Last Treatment Date 11/02/2022 11/02/2022  1:23 PM   Final   Course Elapsed Days 11/02/2022 29   Final   Reference Point ID 11/02/2022 Prostate dp   Final   Reference Point Dosage Given to Da* 11/02/2022 52.5  Gy Final   Reference Point Session Dosage Giv* 11/02/2022 2.5  Gy Final   Plan ID 11/02/2022 Prostate   Final   Plan Name 11/02/2022 simprostate   Final   Plan Fractions Treated to Date 11/02/2022 21   Final   Plan Total Fractions Prescribed 11/02/2022 28   Final   Plan Prescribed Dose Per Fraction 11/02/2022 2.5  Gy Final   Plan Total Prescribed Dose 11/02/2022 70.000000  Gy Final   Plan Primary Reference Point 11/02/2022 Prostate dp   Final  There may be more visits with results that are not included.     X-Rays:DG Knee 1-2 Views Left  Result Date: 11/17/2022 CLINICAL DATA:  Postop left total knee arthroplasty. EXAM: LEFT KNEE - 1-2 VIEW COMPARISON:  None Available. FINDINGS: The patient has undergone recent  left total knee arthroplasty. The hardware appears well positioned. A small amount of air is present within the joint and soft tissues surrounding the knee. There is no evidence of acute fracture or subluxation. IMPRESSION: No demonstrated complication following left total knee arthroplasty. Electronically Signed   By: Carey Bullocks M.D.   On: 11/17/2022 10:41    EKG: Orders placed or performed during the hospital encounter of 11/09/22   EKG 12 lead per protocol   EKG 12 lead per protocol     Hospital Course: Shermaine Thistlethwaite is a 67 y.o. who was admitted to [facility]. They were brought to the operating room on 11/17/2022 and underwent Procedure(s): TOTAL KNEE ARTHROPLASTY.  Patient tolerated the procedure well and was later transferred to the recovery room and then to the orthopaedic floor for postoperative care.  They were given PO and IV analgesics for pain control following their surgery.  They were given 24 hours of postoperative antibiotics of  Anti-infectives (From admission, onward)    Start     Dose/Rate Route Frequency Ordered Stop   11/17/22 1400  ceFAZolin (ANCEF) IVPB 2g/100 mL premix        2 g 200 mL/hr over 30 Minutes Intravenous Every 6 hours 11/17/22 0742 11/17/22 2056   11/17/22 1200  ceFAZolin (ANCEF) IVPB 2g/100 mL premix  Status:  Discontinued        2 g 200 mL/hr over 30 Minutes Intravenous Every 8 hours  11/17/22 0741 11/17/22 0742   11/17/22 0600  ceFAZolin (ANCEF) IVPB 2g/100 mL premix        2 g 200 mL/hr over 30 Minutes Intravenous On call to O.R. 11/17/22 [id number] 11/17/22 0746      and started on DVT prophylaxis in the form of Aspirin, TED hose, and SCDs .   PT and OT were ordered for total joint protocol.  Discharge planning consulted to help with postop disposition and equipment needs.  Patient had a difficult night on the evening of surgery.  They started to get up OOB with therapy on day one. Continued to work with therapy into day two. By day  two, the patient had progressed with therapy and meeting their goals.  Incision was healing well.  Patient was seen in rounds and was ready to go home.   Diet: Regular diet Activity:WBAT Follow-up:in 10-14 days Disposition - Home Discharged Condition: good   Discharge Instructions     Call MD / Call 911   Complete by: As directed    If you experience chest pain or shortness of breath, CALL 911 and be transported to the hospital emergency room.  If you develope a fever above 101 F, pus (white drainage) or increased drainage or redness at the wound, or calf pain, call your surgeon's office.   Constipation Prevention   Complete by: As directed    Drink plenty of fluids.  Prune juice may be helpful.  You may use a stool softener, such as Colace (over the counter) 100 mg twice a day.  Use MiraLax (over the counter) for constipation as needed.   Diet - low sodium heart healthy   Complete by: As directed    Increase activity slowly as tolerated   Complete by: As directed    Post-operative opioid taper instructions:   Complete by: As directed    POST-OPERATIVE OPIOID TAPER INSTRUCTIONS: It is important to wean off of your opioid medication as soon as possible. If you do not need pain medication after your surgery it is ok to stop day one. Opioids include: Codeine, Hydrocodone(Norco, Vicodin), Oxycodone(Percocet, oxycontin) and hydromorphone amongst others.  Long term and even short term use of opiods can cause: Increased pain response Dependence Constipation Depression Respiratory depression And more.  Withdrawal symptoms can include Flu like symptoms Nausea, vomiting And more Techniques to manage these symptoms Hydrate well Eat regular healthy meals Stay active Use relaxation techniques(deep breathing, meditating, yoga) Do Not substitute Alcohol to help with tapering If you have been on opioids for less than two weeks and do not have pain than it is ok to stop all together.   Plan to wean off of opioids This plan should start within one week post op of your joint replacement. Maintain the same interval or time between taking each dose and first decrease the dose.  Cut the total daily intake of opioids by one tablet each day Next start to increase the time between doses. The last dose that should be eliminated is the evening dose.         Allergies as of 11/19/2022   No Known Allergies      Medication List     TAKE these medications    amLODipine 10 MG tablet Commonly known as: NORVASC Take 1 tablet (10 mg total) by mouth daily.   aspirin EC 81 MG tablet Take 1 tablet (81 mg total) by mouth 2 (two) times daily after a meal. Day after surgery   docusate sodium  100 MG capsule Commonly known as: Colace Take 1 capsule (100 mg total) by mouth 2 (two) times daily as needed for mild constipation.   HYDROmorphone 4 MG tablet Commonly known as: Dilaudid Take 1 tablet (4 mg total) by mouth every 4 (four) hours as needed for severe pain.   methocarbamol 500 MG tablet Commonly known as: ROBAXIN Take 1 tablet (500 mg total) by mouth every 8 (eight) hours as needed for muscle spasms.   phenazopyridine 200 MG tablet Commonly known as: Pyridium Take 1 tablet (200 mg total) by mouth 3 (three) times daily as needed for pain.   polyethylene glycol 17 g packet Commonly known as: MIRALAX / GLYCOLAX Take 17 g by mouth daily.        Follow-up Information     Jene Every, MD Follow up in 2 week(s).   Specialty: Orthopedic Surgery Contact information: [address] [place] [address] [phone]                 Signed: Andrez Grime, PA-C Orthopaedic Surgery 11/19/2022, 10:44 AM

## 2022-11-19 NOTE — Plan of Care (Signed)

## 2022-11-22 DIAGNOSIS — M25562 Pain in left knee: Secondary | ICD-10-CM | POA: Diagnosis not present

## 2022-11-22 NOTE — Progress Notes (Signed)
Patient was presented at the [facility] on  07/30/22 for his stage T1c adenocarcinoma of the prostate with a Gleason's score of 3+4 and a PSA of 5.12.  Patient proceed with treatment recommendations of 5.5 weeks of external radiation and had his final radiation treatment on 11/11/22.   Patient is scheduled for a post treatment nurse call on 12/21/22 and has his first post treatment PSA on 02/18/23 at [facility], along with MD visit.    RN spoke with patient to review post treatment follow up appointments and provided education on post treatment PSA monitoring.

## 2022-11-23 ENCOUNTER — Other Ambulatory Visit (HOSPITAL_COMMUNITY): Payer: Self-pay | Admitting: Specialist

## 2022-11-23 ENCOUNTER — Ambulatory Visit (HOSPITAL_COMMUNITY)
Admission: RE | Admit: 2022-11-23 | Discharge: 2022-11-23 | Disposition: A | Discharge status: Home / Self Care | Payer: BC Managed Care – PPO | Source: Ambulatory Visit | Attending: Specialist | Admitting: Specialist

## 2022-11-23 DIAGNOSIS — Z5189 Encounter for other specified aftercare: Secondary | ICD-10-CM | POA: Diagnosis not present

## 2022-11-23 DIAGNOSIS — M25472 Effusion, left ankle: Secondary | ICD-10-CM | POA: Diagnosis not present

## 2022-11-23 DIAGNOSIS — M25572 Pain in left ankle and joints of left foot: Secondary | ICD-10-CM | POA: Diagnosis not present

## 2022-11-23 NOTE — Progress Notes (Signed)
Left lower extremity venous duplex has been completed. Preliminary results can be found in CV Proc through chart review.  Results were given to [place] at Dr. Ermelinda Das office.  11/23/22 9:58 AM Olen Cordial RVT

## 2022-11-26 ENCOUNTER — Other Ambulatory Visit: Payer: Self-pay | Admitting: Radiation Oncology

## 2022-11-26 DIAGNOSIS — M25562 Pain in left knee: Secondary | ICD-10-CM | POA: Diagnosis not present

## 2022-11-26 MED ORDER — TAMSULOSIN HCL 0.4 MG PO CAPS: 0.4000 mg | ORAL_CAPSULE | Freq: Every day | ORAL | 5 refills | Status: DC

## 2022-11-26 NOTE — Progress Notes (Signed)
Patient recently post 5.5 weeks of external radiation with his final radiation treatment on 11/11/22. Pt is experiencing difficulty starting urine stream, takes about 5-10 min to start, and once started goes through a start/stopping process. He denies any abdominal discomfort or distention. He tried Pyridium, with no relief. He does feel like he is emptying his bladder once he is able to void.   RN reviewed with MD and Rx sent for Flomax.  RN notified patient.  RN encouraged patient to update on symptoms next week. If symptoms persist we will coordinate for a urology follow up.

## 2022-11-29 DIAGNOSIS — M25562 Pain in left knee: Secondary | ICD-10-CM | POA: Diagnosis not present

## 2022-12-01 DIAGNOSIS — M25562 Pain in left knee: Secondary | ICD-10-CM | POA: Diagnosis not present

## 2022-12-09 ENCOUNTER — Other Ambulatory Visit: Payer: Self-pay | Admitting: Urology

## 2022-12-09 DIAGNOSIS — C61 Malignant neoplasm of prostate: Secondary | ICD-10-CM

## 2022-12-09 DIAGNOSIS — M25562 Pain in left knee: Secondary | ICD-10-CM | POA: Diagnosis not present

## 2022-12-09 NOTE — Addendum Note (Signed)
Encounter addended by: Marcello Fennel, PA-C on: 12/09/2022 5:37 PM  Actions taken: Clinical Note Signed, Visit diagnoses modified

## 2022-12-18 ENCOUNTER — Other Ambulatory Visit: Payer: Self-pay | Admitting: Family Medicine

## 2022-12-20 NOTE — Telephone Encounter (Signed)
Per office note last BP still elevated.  12/23/2022 for a recheck so a 30 day courtesy refill given to last until his appt.  Requested Prescriptions  Pending Prescriptions Disp Refills   amLODipine (NORVASC) 10 MG tablet [Pharmacy Med Name: AMLODIPINE BESYLATE 10 MG TAB] 30 tablet 0    Sig: TAKE 1 TABLET BY MOUTH EVERY DAY     Cardiovascular: Calcium Channel Blockers 2 Failed - 12/18/2022  9:32 AM      Failed - Valid encounter within last 6 months    Recent Outpatient Visits           2 months ago Essential hypertension   [facility], MD   11 months ago Annual physical exam   [facility], MD   5 years ago Peritonsillar abscess   Primary [facility] at [place], [place], [place]   5 years ago Acute pharyngitis, unspecified etiology   [facility] Care at [facility], Sandria Bales, MD       Future Appointments             In 3 days Georganna Skeans, MD [facility] at [facility] - Last BP in normal range    BP Readings from Last 1 Encounters:  11/19/22 133/78         Passed - Last Heart Rate in normal range    Pulse Readings from Last 1 Encounters:  11/19/22 98

## 2022-12-21 ENCOUNTER — Ambulatory Visit
Admission: RE | Admit: 2022-12-21 | Discharge: 2022-12-21 | Disposition: A | Discharge status: Home / Self Care | Payer: BC Managed Care – PPO | Source: Ambulatory Visit | Attending: Radiation Oncology | Admitting: Radiation Oncology

## 2022-12-21 NOTE — Progress Notes (Signed)
  Radiation Oncology         [phone]) [phone] ________________________________  Name: Vincent Ray MRN: [id number]  Date of Service: 12/21/2022  DOB: [birth year]  Post Treatment Telephone Note  Diagnosis:  67 y.o. gentleman with stage T1c adenocarcinoma of the prostate with a Gleason's score of 3+4 and a PSA of 5.12 (as documented in provider EOT note)   Pre Treatment IPSS Score: 0 (as documented in the provider consult note)   The patient was available for call today.   Symptoms of fatigue have improved since completing therapy.  Symptoms of bladder changes have improved since completing therapy. Current symptoms include none, and medications for bladder symptoms include Tamsulosin.  Symptoms of bowel changes have improved since completing therapy. Current symptoms include none, and medications for bowel symptoms include none.     Post Treatment IPSS Score: IPSS Questionnaire (AUA-7): Over the past month.   1)  How often have you had a sensation of not emptying your bladder completely after you finish urinating?  0 - Not at all  2)  How often have you had to urinate again less than two hours after you finished urinating? 0 - Not at all  3)  How often have you found you stopped and started again several times when you urinated?  0 - Not at all  4) How difficult have you found it to postpone urination?  0 - Not at all  5) How often have you had a weak urinary stream?  0 - Not at all  6) How often have you had to push or strain to begin urination?  0 - Not at all  7) How many times did you most typically get up to urinate from the time you went to bed until the time you got up in the morning?  2 - 2 times  Total score:  2. Which indicates mild symptoms  0-7 mildly symptomatic   8-19 moderately symptomatic   20-35 severely symptomatic    Patient has a scheduled follow up visit with his urologist, Dr. Alvester Morin, on 02/18/2023 for ongoing surveillance. He was counseled that PSA  levels will be drawn in the urology office, and was reassured that additional time is expected to improve bowel and bladder symptoms. He was encouraged to call back with concerns or questions regarding radiation.    This concludes the interaction.  Ruel Favors, LPN

## 2022-12-23 ENCOUNTER — Ambulatory Visit: Payer: BC Managed Care – PPO | Admitting: Family Medicine

## 2022-12-23 ENCOUNTER — Encounter: Payer: Self-pay | Admitting: Family Medicine

## 2022-12-23 VITALS — BP 125/80 | HR 93 | Temp 97.8°F | Resp 16 | Wt 226.8 lb

## 2022-12-23 DIAGNOSIS — Z96652 Presence of left artificial knee joint: Secondary | ICD-10-CM

## 2022-12-23 DIAGNOSIS — I10 Essential (primary) hypertension: Secondary | ICD-10-CM

## 2022-12-23 NOTE — Progress Notes (Signed)
Patient is here for their 3 month follow-up Patient has no concerns today Care gaps have been discussed with patient  

## 2022-12-23 NOTE — Progress Notes (Signed)
Established Patient Office Visit  Subjective    Patient ID: Vincent Ray, male    DOB: [birth year]  Age: 67 y.o. MRN: [id number]  CC: No chief complaint on file.   HPI Vincent Ray presents to follow up chronic med issues. Patient reports recent left knee replacement.    Outpatient Encounter Medications as of 12/23/2022  Medication Sig   diclofenac Sodium (VOLTAREN) 1 % GEL APPLY 2 GRAMS TO THE AFFECTED AREA(S) BY TOPICAL ROUTE 2 TIMES PER DAY AS NEEDED   esomeprazole (NEXIUM) 40 MG capsule Take 40 mg by mouth daily.   amLODipine (NORVASC) 10 MG tablet TAKE 1 TABLET BY MOUTH EVERY DAY   aspirin EC 81 MG tablet Take 1 tablet (81 mg total) by mouth 2 (two) times daily after a meal. Day after surgery   gabapentin (NEURONTIN) 300 MG capsule TAKE 1 CAPSULE BY MOUTH THREE TIMES A DAY AS NEEDED FOR 30 DAYS   HYDROmorphone (DILAUDID) 4 MG tablet Take 1 tablet (4 mg total) by mouth every 4 (four) hours as needed for severe pain.   ibuprofen (ADVIL) 800 MG tablet TAKE 1 TABLET BY MOUTH THREE TIMES A DAY AS NEEDED FOR 10 DAYS   tamsulosin (FLOMAX) 0.4 MG CAPS capsule Take 1 capsule (0.4 mg total) by mouth daily after supper.   [DISCONTINUED] docusate sodium (COLACE) 100 MG capsule Take 1 capsule (100 mg total) by mouth 2 (two) times daily as needed for mild constipation.   [DISCONTINUED] methocarbamol (ROBAXIN) 500 MG tablet Take 1 tablet (500 mg total) by mouth every 8 (eight) hours as needed for muscle spasms.   [DISCONTINUED] phenazopyridine (PYRIDIUM) 200 MG tablet Take 1 tablet (200 mg total) by mouth 3 (three) times daily as needed for pain.   [DISCONTINUED] polyethylene glycol (MIRALAX / GLYCOLAX) 17 g packet Take 17 g by mouth daily.   No facility-administered encounter medications on file as of 12/23/2022.    Past Medical History:  Diagnosis Date   Arthritis    Asthma    As a child no problems now   Hallux rigidus of right foot    Hypertension    prostate cancer     Wears glasses     Past Surgical History:  Procedure Laterality Date   CALCANEAL OSTEOTOMY Right 05/04/2018   Procedure: MEDIAL CALCANEAL OSTEOTOMY;  Surgeon: Toni Arthurs, MD;  Location: [facility];  Service: Orthopedics;  Laterality: Right;   GASTROC RECESSION EXTREMITY Right 05/04/2018   Procedure: RIGHT GASTROC RECESSION;  Surgeon: Toni Arthurs, MD;  Location: [facility];  Service: Orthopedics;  Laterality: Right;   GOLD SEED IMPLANT N/A 09/15/2022   Procedure: GOLD SEED IMPLANT;  Surgeon: Crista Elliot, MD;  Location: [facility];  Service: Urology;  Laterality: N/A;   HARDWARE REMOVAL Right 05/04/2018   Procedure: MEDIAL MALLEOLUS HARDWARE REMOVAL;  Surgeon: Toni Arthurs, MD;  Location: [facility];  Service: Orthopedics;  Laterality: Right;   KNEE SURGERY     arthroscopy right knee   ORIF ANKLE FRACTURE Right 11/08/2014   Procedure: OPEN REDUCTION INTERNAL FIXATION (ORIF) RIGHT ANKLE ;  Surgeon: Jene Every, MD;  Location: [facility];  Service: Orthopedics;  Laterality: Right;   SPACE OAR INSTILLATION N/A 09/15/2022   Procedure: SPACE OAR INSTILLATION;  Surgeon: Crista Elliot, MD;  Location: [facility];  Service: Urology;  Laterality: N/A;   TOTAL KNEE ARTHROPLASTY Right 04/23/2016   Procedure: RIGHT TOTAL KNEE ARTHROPLASTY;  Surgeon: Jene Every,  MD;  Location: [facility];  Service: Orthopedics;  Laterality: Right;   TOTAL KNEE ARTHROPLASTY Left 11/17/2022   Procedure: TOTAL KNEE ARTHROPLASTY;  Surgeon: Jene Every, MD;  Location: [facility];  Service: Orthopedics;  Laterality: Left;    Family History  Problem Relation Age of Onset   Dementia Mother    Colon cancer Neg Hx     Social History   Socioeconomic History   Marital status: Married    Spouse name: Not on file   Number of children: Not on file   Years of education: Not on file   Highest education level: Associate degree: occupational,  Scientist, product/process development, or vocational program  Occupational History   Not on file  Tobacco Use   Smoking status: Never    Passive exposure: Never   Smokeless tobacco: Never  Vaping Use   Vaping status: Never Used  Substance and Sexual Activity   Alcohol use: Yes    Comment: occasionally    Drug use: No   Sexual activity: Not on file  Other Topics Concern   Not on file  Social History Narrative   Not on file   Social Determinants of Health   Financial Resource Strain: Low Risk  (12/19/2022)   Overall Financial Resource Strain (CARDIA)    Difficulty of Paying Living Expenses: Not hard at all  Food Insecurity: No Food Insecurity (12/19/2022)   Hunger Vital Sign    Worried About Running Out of Food in the Last Year: Never true    Ran Out of Food in the Last Year: Never true  Transportation Needs: No Transportation Needs (12/19/2022)   PRAPARE - Administrator, Civil Service (Medical): No    Lack of Transportation (Non-Medical): No  Physical Activity: Unknown (12/19/2022)   Exercise Vital Sign    Days of Exercise per Week: Patient declined    Minutes of Exercise per Session: Not on file  Stress: No Stress Concern Present (12/19/2022)   [employer] of Occupational Health - Occupational Stress Questionnaire    Feeling of Stress : Not at all  Social Connections: Unknown (12/19/2022)   Social Connection and Isolation Panel [NHANES]    Frequency of Communication with Friends and Family: Three times a week    Frequency of Social Gatherings with Friends and Family: Patient declined    Attends Religious Services: Patient declined    Database administrator or Organizations: No    Attends Engineer, structural: Not on file    Marital Status: Married  Catering manager Violence: Not At Risk (11/17/2022)   Humiliation, Afraid, Rape, and Kick questionnaire    Fear of Current or Ex-Partner: No    Emotionally Abused: No    Physically Abused: No    Sexually Abused: No    Review  of Systems  All other systems reviewed and are negative.       Objective    BP 125/80   Pulse 93   Temp 97.8 F (36.6 C) (Temporal)   Resp 16   Wt 226 lb 12.8 oz (102.9 kg)   SpO2 93%   BMI 28.35 kg/m   Physical Exam Vitals and nursing note reviewed.  Constitutional:      General: He is not in acute distress. Cardiovascular:     Rate and Rhythm: Normal rate and regular rhythm.  Pulmonary:     Effort: Pulmonary effort is normal.     Breath sounds: Normal breath sounds.  Abdominal:     Palpations: Abdomen is  soft.     Tenderness: There is no abdominal tenderness.  Musculoskeletal:     Comments: Left knee with well healing surgical scar - utilizing cane for stability  Neurological:     General: No focal deficit present.     Mental Status: He is alert and oriented to person, place, and time.         Assessment & Plan:   1. Essential hypertension Appears stable.continue   2. History of left knee replacement Management as per consultant    No follow-ups on file.   Tommie Raymond, MD

## 2022-12-27 ENCOUNTER — Telehealth: Payer: Self-pay | Admitting: *Deleted

## 2022-12-28 ENCOUNTER — Inpatient Hospital Stay: Payer: BC Managed Care – PPO | Admitting: *Deleted

## 2022-12-29 ENCOUNTER — Inpatient Hospital Stay: Payer: BC Managed Care – PPO | Attending: Adult Health | Admitting: *Deleted

## 2022-12-29 ENCOUNTER — Encounter: Payer: Self-pay | Admitting: *Deleted

## 2022-12-29 DIAGNOSIS — C61 Malignant neoplasm of prostate: Secondary | ICD-10-CM

## 2022-12-29 NOTE — Progress Notes (Unsigned)
SCP reviewed and completed. 

## 2022-12-30 ENCOUNTER — Encounter: Payer: Self-pay | Admitting: *Deleted

## 2022-12-30 NOTE — Progress Notes (Signed)
Called pt with scheduled appts for PSA labs at [facility]  on Aug 20 at 8:45 and appt with Dr. Alvester Morin on Aug 27 at 9:30.

## 2023-01-17 ENCOUNTER — Other Ambulatory Visit: Payer: Self-pay | Admitting: Family Medicine

## 2023-01-17 ENCOUNTER — Other Ambulatory Visit: Payer: Self-pay

## 2023-01-17 DIAGNOSIS — I10 Essential (primary) hypertension: Secondary | ICD-10-CM

## 2023-01-17 MED ORDER — AMLODIPINE BESYLATE 10 MG PO TABS
10.0000 mg | ORAL_TABLET | Freq: Every day | ORAL | 0 refills | Status: DC
Start: 2023-01-17 — End: 2023-02-14

## 2023-02-14 ENCOUNTER — Other Ambulatory Visit: Payer: Self-pay | Admitting: Family Medicine

## 2023-02-14 DIAGNOSIS — I10 Essential (primary) hypertension: Secondary | ICD-10-CM

## 2023-02-18 DIAGNOSIS — C61 Malignant neoplasm of prostate: Secondary | ICD-10-CM | POA: Diagnosis not present

## 2023-02-18 DIAGNOSIS — R1084 Generalized abdominal pain: Secondary | ICD-10-CM | POA: Diagnosis not present

## 2023-02-18 DIAGNOSIS — R3121 Asymptomatic microscopic hematuria: Secondary | ICD-10-CM | POA: Diagnosis not present

## 2023-02-21 ENCOUNTER — Telehealth: Payer: Self-pay | Admitting: Family Medicine

## 2023-03-03 DIAGNOSIS — K5730 Diverticulosis of large intestine without perforation or abscess without bleeding: Secondary | ICD-10-CM | POA: Diagnosis not present

## 2023-03-03 DIAGNOSIS — N281 Cyst of kidney, acquired: Secondary | ICD-10-CM | POA: Diagnosis not present

## 2023-03-03 DIAGNOSIS — N400 Enlarged prostate without lower urinary tract symptoms: Secondary | ICD-10-CM | POA: Diagnosis not present

## 2023-03-03 DIAGNOSIS — C61 Malignant neoplasm of prostate: Secondary | ICD-10-CM | POA: Diagnosis not present

## 2023-03-03 DIAGNOSIS — R3121 Asymptomatic microscopic hematuria: Secondary | ICD-10-CM | POA: Diagnosis not present

## 2023-03-07 DIAGNOSIS — Z96652 Presence of left artificial knee joint: Secondary | ICD-10-CM | POA: Diagnosis not present

## 2023-03-08 ENCOUNTER — Telehealth: Payer: Self-pay | Admitting: Family Medicine

## 2023-03-08 NOTE — Telephone Encounter (Signed)
error 

## 2023-03-24 DIAGNOSIS — R1084 Generalized abdominal pain: Secondary | ICD-10-CM | POA: Diagnosis not present

## 2023-03-24 DIAGNOSIS — C61 Malignant neoplasm of prostate: Secondary | ICD-10-CM | POA: Diagnosis not present

## 2023-03-24 DIAGNOSIS — R3912 Poor urinary stream: Secondary | ICD-10-CM | POA: Diagnosis not present

## 2023-03-30 ENCOUNTER — Telehealth: Payer: Self-pay | Admitting: Family Medicine

## 2023-04-15 ENCOUNTER — Other Ambulatory Visit: Payer: Self-pay | Admitting: Radiation Oncology

## 2023-05-04 ENCOUNTER — Telehealth: Payer: Self-pay | Admitting: Family Medicine

## 2023-05-04 DIAGNOSIS — Z96652 Presence of left artificial knee joint: Secondary | ICD-10-CM | POA: Diagnosis not present

## 2023-05-11 ENCOUNTER — Telehealth: Payer: Self-pay | Admitting: Family Medicine

## 2023-05-14 ENCOUNTER — Other Ambulatory Visit: Payer: Self-pay | Admitting: Family Medicine

## 2023-05-14 DIAGNOSIS — I10 Essential (primary) hypertension: Secondary | ICD-10-CM

## 2023-06-22 DIAGNOSIS — R8271 Bacteriuria: Secondary | ICD-10-CM | POA: Diagnosis not present

## 2023-06-22 DIAGNOSIS — R3912 Poor urinary stream: Secondary | ICD-10-CM | POA: Diagnosis not present

## 2023-06-22 DIAGNOSIS — C61 Malignant neoplasm of prostate: Secondary | ICD-10-CM | POA: Diagnosis not present

## 2023-06-22 DIAGNOSIS — R3121 Asymptomatic microscopic hematuria: Secondary | ICD-10-CM | POA: Diagnosis not present

## 2023-06-23 ENCOUNTER — Telehealth: Payer: Self-pay | Admitting: Family Medicine

## 2023-06-23 DIAGNOSIS — R3912 Poor urinary stream: Secondary | ICD-10-CM | POA: Diagnosis not present

## 2023-06-23 DIAGNOSIS — R300 Dysuria: Secondary | ICD-10-CM | POA: Diagnosis not present

## 2023-06-23 DIAGNOSIS — C61 Malignant neoplasm of prostate: Secondary | ICD-10-CM | POA: Diagnosis not present

## 2023-06-23 DIAGNOSIS — R310 Gross hematuria: Secondary | ICD-10-CM | POA: Diagnosis not present

## 2023-06-23 NOTE — Telephone Encounter (Signed)
Marland Kitchen

## 2023-06-24 ENCOUNTER — Telehealth: Payer: Self-pay | Admitting: Family Medicine

## 2023-06-24 NOTE — Telephone Encounter (Signed)
 Marland Kitchen

## 2023-06-27 ENCOUNTER — Encounter: Payer: Self-pay | Admitting: Family Medicine

## 2023-06-27 ENCOUNTER — Ambulatory Visit (INDEPENDENT_AMBULATORY_CARE_PROVIDER_SITE_OTHER): Payer: Medicare HMO | Admitting: Family Medicine

## 2023-06-27 ENCOUNTER — Other Ambulatory Visit: Payer: Self-pay | Admitting: Family Medicine

## 2023-06-27 VITALS — BP 138/85 | HR 97 | Temp 98.4°F | Resp 16 | Ht 75.0 in | Wt 240.4 lb

## 2023-06-27 DIAGNOSIS — Z0000 Encounter for general adult medical examination without abnormal findings: Secondary | ICD-10-CM

## 2023-06-27 DIAGNOSIS — Z13220 Encounter for screening for lipoid disorders: Secondary | ICD-10-CM | POA: Diagnosis not present

## 2023-06-27 DIAGNOSIS — Z130 Encounter for screening for diseases of the blood and blood-forming organs and certain disorders involving the immune mechanism: Secondary | ICD-10-CM

## 2023-06-27 MED ORDER — SILDENAFIL CITRATE 100 MG PO TABS: 50.0000 mg | ORAL_TABLET | Freq: Every day | ORAL | 0 refills | Status: DC | PRN

## 2023-06-27 NOTE — Progress Notes (Unsigned)
Established Patient Office Visit  Subjective    Patient ID: Vincent Ray, male    DOB: 28-Feb-1956  Age: 68 y.o. MRN: [id number]  CC:  Chief Complaint  Patient presents with   Annual Exam    HPI Breckon Reeves presents for routine annual exam. Patient denies acute complaints.   Outpatient Encounter Medications as of 06/27/2023  Medication Sig   sildenafil (VIAGRA) 100 MG tablet Take 0.5-1 tablets (50-100 mg total) by mouth daily as needed for erectile dysfunction.   amLODipine (NORVASC) 10 MG tablet TAKE 1 TABLET BY MOUTH EVERY DAY   tamsulosin (FLOMAX) 0.4 MG CAPS capsule TAKE 1 CAPSULE BY MOUTH EVERY DAY AFTER SUPPER   [DISCONTINUED] amLODipine (NORVASC) 10 MG tablet TAKE 1 TABLET BY MOUTH EVERY DAY   [DISCONTINUED] aspirin EC 81 MG tablet Take 1 tablet (81 mg total) by mouth 2 (two) times daily after a meal. Day after surgery   [DISCONTINUED] diclofenac Sodium (VOLTAREN) 1 % GEL APPLY 2 GRAMS TO THE AFFECTED AREA(S) BY TOPICAL ROUTE 2 TIMES PER DAY AS NEEDED   [DISCONTINUED] esomeprazole (NEXIUM) 40 MG capsule Take 40 mg by mouth daily.   No facility-administered encounter medications on file as of 06/27/2023.    Past Medical History:  Diagnosis Date   Arthritis    Asthma    As a child no problems now   Hallux rigidus of right foot    Hypertension    prostate cancer    Wears glasses     Past Surgical History:  Procedure Laterality Date   CALCANEAL OSTEOTOMY Right 05/04/2018   Procedure: MEDIAL CALCANEAL OSTEOTOMY;  Surgeon: Toni Arthurs, MD;  Location: [facility];  Service: Orthopedics;  Laterality: Right;   GASTROC RECESSION EXTREMITY Right 05/04/2018   Procedure: RIGHT GASTROC RECESSION;  Surgeon: Toni Arthurs, MD;  Location: [facility];  Service: Orthopedics;  Laterality: Right;   GOLD SEED IMPLANT N/A 09/15/2022   Procedure: GOLD SEED IMPLANT;  Surgeon: Crista Elliot, MD;  Location: [facility];   Service: Urology;  Laterality: N/A;   HARDWARE REMOVAL Right 05/04/2018   Procedure: MEDIAL MALLEOLUS HARDWARE REMOVAL;  Surgeon: Toni Arthurs, MD;  Location: [facility];  Service: Orthopedics;  Laterality: Right;   KNEE SURGERY     arthroscopy right knee   ORIF ANKLE FRACTURE Right 11/08/2014   Procedure: OPEN REDUCTION INTERNAL FIXATION (ORIF) RIGHT ANKLE ;  Surgeon: Jene Every, MD;  Location: [facility];  Service: Orthopedics;  Laterality: Right;   SPACE OAR INSTILLATION N/A 09/15/2022   Procedure: SPACE OAR INSTILLATION;  Surgeon: Crista Elliot, MD;  Location: [facility];  Service: Urology;  Laterality: N/A;   TOTAL KNEE ARTHROPLASTY Right 04/23/2016   Procedure: RIGHT TOTAL KNEE ARTHROPLASTY;  Surgeon: Jene Every, MD;  Location: [facility];  Service: Orthopedics;  Laterality: Right;   TOTAL KNEE ARTHROPLASTY Left 11/17/2022   Procedure: TOTAL KNEE ARTHROPLASTY;  Surgeon: Jene Every, MD;  Location: [facility];  Service: Orthopedics;  Laterality: Left;    Family History  Problem Relation Age of Onset   Dementia Mother    Colon cancer Neg Hx     Social History   Socioeconomic History   Marital status: Married    Spouse name: Not on file   Number of children: Not on file   Years of education: Not on file   Highest education level: Associate degree: occupational, Scientist, product/process development, or vocational program  Occupational History   Not on  file  Tobacco Use   Smoking status: Never    Passive exposure: Never   Smokeless tobacco: Never  Vaping Use   Vaping status: Never Used  Substance and Sexual Activity   Alcohol use: Yes    Comment: occasionally    Drug use: No   Sexual activity: Not on file  Other Topics Concern   Not on file  Social History Narrative   Not on file   Social Drivers of Health   Financial Resource Strain: Low Risk  (12/19/2022)   Overall Financial Resource Strain (CARDIA)    Difficulty of Paying Living Expenses: Not hard at all   Food Insecurity: No Food Insecurity (12/19/2022)   Hunger Vital Sign    Worried About Running Out of Food in the Last Year: Never true    Ran Out of Food in the Last Year: Never true  Transportation Needs: No Transportation Needs (12/19/2022)   PRAPARE - Administrator, Civil Service (Medical): No    Lack of Transportation (Non-Medical): No  Physical Activity: Unknown (12/19/2022)   Exercise Vital Sign    Days of Exercise per Week: Patient declined    Minutes of Exercise per Session: Not on file  Stress: No Stress Concern Present (12/19/2022)   [employer] of Occupational Health - Occupational Stress Questionnaire    Feeling of Stress : Not at all  Social Connections: Unknown (12/19/2022)   Social Connection and Isolation Panel [NHANES]    Frequency of Communication with Friends and Family: Three times a week    Frequency of Social Gatherings with Friends and Family: Patient declined    Attends Religious Services: Patient declined    Database administrator or Organizations: No    Attends Engineer, structural: Not on file    Marital Status: Married  Catering manager Violence: Not At Risk (11/17/2022)   Humiliation, Afraid, Rape, and Kick questionnaire    Fear of Current or Ex-Partner: No    Emotionally Abused: No    Physically Abused: No    Sexually Abused: No    Review of Systems  All other systems reviewed and are negative.       Objective    BP 138/85 (BP Location: Left Arm, Patient Position: Sitting, Cuff Size: Normal)   Pulse 97   Temp 98.4 F (36.9 C) (Oral)   Resp 16   Ht 6\' 3"  (1.905 m)   Wt 240 lb 6.4 oz (109 kg)   SpO2 92%   BMI 30.05 kg/m   Physical Exam Vitals and nursing note reviewed.  Constitutional:      General: He is not in acute distress. HENT:     Head: Normocephalic and atraumatic.     Right Ear: Tympanic membrane, ear canal and external ear normal.     Left Ear: Tympanic membrane, ear canal and external ear normal.      Nose: Nose normal.     Mouth/Throat:     Mouth: Mucous membranes are moist.     Pharynx: Oropharynx is clear.  Eyes:     Conjunctiva/sclera: Conjunctivae normal.     Pupils: Pupils are equal, round, and reactive to light.  Neck:     Thyroid: No thyromegaly.  Cardiovascular:     Rate and Rhythm: Normal rate and regular rhythm.     Heart sounds: Normal heart sounds. No murmur heard. Pulmonary:     Effort: Pulmonary effort is normal.     Breath sounds: Normal breath sounds.  Abdominal:  General: There is no distension.     Palpations: Abdomen is soft. There is no mass.     Tenderness: There is no abdominal tenderness.     Hernia: There is no hernia in the left inguinal area or right inguinal area.  Musculoskeletal:        General: Normal range of motion.     Cervical back: Normal range of motion and neck supple.     Right lower leg: No edema.     Left lower leg: No edema.  Skin:    General: Skin is warm and dry.  Neurological:     General: No focal deficit present.     Mental Status: He is alert and oriented to person, place, and time. Mental status is at baseline.  Psychiatric:        Mood and Affect: Mood normal.        Behavior: Behavior normal.         Assessment & Plan:   Annual physical exam -     CMP14+EGFR  Screening for deficiency anemia -     CBC with Differential/Platelet  Screening for lipid disorders -     Lipid panel  Other orders -     Sildenafil Citrate; Take 0.5-1 tablets (50-100 mg total) by mouth daily as needed for erectile dysfunction.  Dispense: 5 tablet; Refill: 0     No follow-ups on file.   Tommie Raymond, MD

## 2023-06-28 ENCOUNTER — Encounter: Payer: Self-pay | Admitting: Family Medicine

## 2023-06-29 ENCOUNTER — Telehealth: Payer: Self-pay | Admitting: *Deleted

## 2023-06-29 NOTE — Telephone Encounter (Signed)
Labs drawn on patient

## 2023-07-01 LAB — CMP14+EGFR
ALT: 23 [IU]/L (ref 0–44)
AST: 31 [IU]/L (ref 0–40)
Albumin: 4.3 g/dL (ref 3.9–4.9)
Alkaline Phosphatase: 98 [IU]/L (ref 44–121)
BUN/Creatinine Ratio: 10 (ref 10–24)
BUN: 13 mg/dL (ref 8–27)
Bilirubin Total: 0.3 mg/dL (ref 0.0–1.2)
CO2: 22 mmol/L (ref 20–29)
Calcium: 9.4 mg/dL (ref 8.6–10.2)
Chloride: 101 mmol/L (ref 96–106)
Creatinine, Ser: 1.32 mg/dL — ABNORMAL HIGH (ref 0.76–1.27)
Globulin, Total: 2.9 g/dL (ref 1.5–4.5)
Glucose: 92 mg/dL (ref 70–99)
Potassium: 4 mmol/L (ref 3.5–5.2)
Sodium: 138 mmol/L (ref 134–144)
Total Protein: 7.2 g/dL (ref 6.0–8.5)
eGFR: 60 mL/min/{1.73_m2} (ref >59)

## 2023-07-01 LAB — CBC/DIFF AMBIGUOUS DEFAULT
Basophils Absolute: 0 10*3/uL (ref 0.0–0.2)
Basos: 1 %
EOS (ABSOLUTE): 0.2 10*3/uL (ref 0.0–0.4)
Eos: 5 %
Hematocrit: 44.4 % (ref 37.5–51.0)
Hemoglobin: 14.7 g/dL (ref 13.0–17.7)
Immature Grans (Abs): 0 10*3/uL (ref 0.0–0.1)
Immature Granulocytes: 1 %
Lymphocytes Absolute: 1.5 10*3/uL (ref 0.7–3.1)
Lymphs: 34 %
MCH: 29.8 pg (ref 26.6–33.0)
MCHC: 33.1 g/dL (ref 31.5–35.7)
MCV: 90 fL (ref 79–97)
Monocytes Absolute: 0.5 10*3/uL (ref 0.1–0.9)
Monocytes: 12 %
Neutrophils Absolute: 2.1 10*3/uL (ref 1.4–7.0)
Neutrophils: 47 %
Platelets: 263 10*3/uL (ref 150–450)
RBC: 4.94 x10E6/uL (ref 4.14–5.80)
RDW: 13.8 % (ref 11.6–15.4)
WBC: 4.3 10*3/uL (ref 3.4–10.8)

## 2023-07-01 LAB — LIPID PANEL W/O CHOL/HDL RATIO
Cholesterol, Total: 167 mg/dL (ref 100–199)
HDL: 34 mg/dL — ABNORMAL LOW (ref >39)
LDL Chol Calc (NIH): 98 mg/dL (ref 0–99)
Triglycerides: 202 mg/dL — ABNORMAL HIGH (ref 0–149)
VLDL Cholesterol Cal: 35 mg/dL (ref 5–40)

## 2023-07-01 LAB — SPECIMEN STATUS REPORT

## 2023-07-05 ENCOUNTER — Telehealth: Payer: Self-pay | Admitting: *Deleted

## 2023-07-05 NOTE — Telephone Encounter (Signed)
Patient was called and informed that as far as the provider can see he do not need a lab  re-draw for anything

## 2023-08-02 DIAGNOSIS — D414 Neoplasm of uncertain behavior of bladder: Secondary | ICD-10-CM | POA: Diagnosis not present

## 2023-08-02 DIAGNOSIS — R310 Gross hematuria: Secondary | ICD-10-CM | POA: Diagnosis not present

## 2023-08-02 DIAGNOSIS — C61 Malignant neoplasm of prostate: Secondary | ICD-10-CM | POA: Diagnosis not present

## 2023-08-12 ENCOUNTER — Other Ambulatory Visit: Payer: Self-pay | Admitting: Family Medicine

## 2023-08-12 DIAGNOSIS — I10 Essential (primary) hypertension: Secondary | ICD-10-CM

## 2023-08-12 DIAGNOSIS — R310 Gross hematuria: Secondary | ICD-10-CM | POA: Diagnosis not present

## 2023-08-12 DIAGNOSIS — C61 Malignant neoplasm of prostate: Secondary | ICD-10-CM | POA: Diagnosis not present

## 2023-08-24 DIAGNOSIS — D414 Neoplasm of uncertain behavior of bladder: Secondary | ICD-10-CM | POA: Diagnosis not present

## 2023-08-24 DIAGNOSIS — N3041 Irradiation cystitis with hematuria: Secondary | ICD-10-CM | POA: Diagnosis not present

## 2023-08-24 DIAGNOSIS — N3020 Other chronic cystitis without hematuria: Secondary | ICD-10-CM | POA: Diagnosis not present

## 2023-09-01 DIAGNOSIS — R310 Gross hematuria: Secondary | ICD-10-CM | POA: Diagnosis not present

## 2023-09-01 DIAGNOSIS — N3041 Irradiation cystitis with hematuria: Secondary | ICD-10-CM | POA: Diagnosis not present

## 2023-09-01 DIAGNOSIS — R361 Hematospermia: Secondary | ICD-10-CM | POA: Diagnosis not present

## 2023-09-01 DIAGNOSIS — C61 Malignant neoplasm of prostate: Secondary | ICD-10-CM | POA: Diagnosis not present

## 2023-09-02 ENCOUNTER — Ambulatory Visit: Payer: Self-pay

## 2023-09-02 NOTE — Telephone Encounter (Signed)
 Copied from CRM [id number]. Topic: Clinical - Red Word Triage >> Sep 02, 2023 12:49 PM Shelah Lewandowsky wrote: Red Word that prompted transfer to Nurse Triage: both ankles swollen, blood in semen   Chief Complaint: Ankle swelling  Symptoms: Ankle swelling, blood in semen  Frequency: Constant swelling Pertinent Negatives: Patient denies pain with ejaculation Disposition: [] ED /[] Urgent Care (no appt availability in office) / [x] Appointment(In office/virtual)/ []  [facility]/ [] Home Care/ [] Refused Recommended Disposition /[] [facility]/ []  Follow-up with PCP Additional Notes: Patient states he has been experiencing swelling of his ankle for about 6 months. He states the swelling is mild and does not interfere with his daily activities. He states that he also has mild pain in his ankles that is intermittent. He is unsure of what is causing the swelling but believes it may be due to his blood pressure medication. Patient also reports that for the last month he has had some blood in his semen. Patient could not elaborate on how much blood is present but states that he has no pain with ejaculation. Patient advised that Dr. Andrey Campanile does not have any appointments until next month and I offered to schedule with another provider but he declined, stating he only wants to see Dr. Andrey Campanile. Appointment made for the patient and appointment added to the wait list. Patient instructed to call back for new or worsening symptoms. Patient verbalized understanding and agreement with this plan.    Reason for Disposition  Ankle swelling is a chronic symptom (recurrent or ongoing AND present > 4 weeks)  Answer Assessment - Initial Assessment Questions 1. LOCATION: "Which ankle is swollen?" "Where is the swelling?"     Ankles  2. ONSET: "When did the swelling start?"     Months  3. SWELLING: "How bad is the swelling?" Or, "How large is it?" (e.g., mild, moderate, severe; size of localized swelling)    -  NONE: No joint swelling.   - LOCALIZED: Localized; small area of puffy or swollen skin (e.g., insect bite, skin irritation).   - MILD: Joint looks or feels mildly swollen or puffy.   - MODERATE: Swollen; interferes with normal activities (e.g., work or school); decreased range of movement; may be limping.   - SEVERE: Very swollen; can't move swollen joint at all; limping a lot or unable to walk.     Mild 4. PAIN: "Is there any pain?" If Yes, ask: "How bad is it?" (Scale 1-10; or mild, moderate, severe)   - NONE (0): no pain.   - MILD (1-3): doesn't interfere with normal activities.    - MODERATE (4-7): interferes with normal activities (e.g., work or school) or awakens from sleep, limping.    - SEVERE (8-10): excruciating pain, unable to do any normal activities, unable to walk.      Mild 5. CAUSE: "What do you think caused the ankle swelling?"     Possibly due to blood pressure medication  6. OTHER SYMPTOMS: "Do you have any other symptoms?" (e.g., fever, chest pain, difficulty breathing, calf pain)     Blood in semen  Protocols used: Ankle Swelling-A-AH

## 2023-09-02 NOTE — Telephone Encounter (Signed)
 Appointment made for 5/15 do you want to see him sooner ?

## 2023-09-07 NOTE — Telephone Encounter (Signed)
 Noted.

## 2023-09-29 ENCOUNTER — Encounter (HOSPITAL_COMMUNITY): Payer: Self-pay

## 2023-10-06 ENCOUNTER — Encounter: Payer: Self-pay | Admitting: Family Medicine

## 2023-10-06 ENCOUNTER — Ambulatory Visit (INDEPENDENT_AMBULATORY_CARE_PROVIDER_SITE_OTHER): Admitting: Family Medicine

## 2023-10-06 VITALS — BP 128/83 | HR 95 | Wt 239.2 lb

## 2023-10-06 DIAGNOSIS — I10 Essential (primary) hypertension: Secondary | ICD-10-CM

## 2023-10-06 DIAGNOSIS — C61 Malignant neoplasm of prostate: Secondary | ICD-10-CM | POA: Diagnosis not present

## 2023-10-06 DIAGNOSIS — R600 Localized edema: Secondary | ICD-10-CM | POA: Diagnosis not present

## 2023-10-06 DIAGNOSIS — R361 Hematospermia: Secondary | ICD-10-CM | POA: Diagnosis not present

## 2023-10-06 MED ORDER — LOSARTAN POTASSIUM 50 MG PO TABS
50.0000 mg | ORAL_TABLET | Freq: Every day | ORAL | 0 refills | Status: DC
Start: 2023-10-06 — End: 2024-01-02

## 2023-10-06 NOTE — Progress Notes (Signed)
 Established Patient Office Visit  Subjective    Patient ID: Vincent Ray, male    DOB: 1956-02-03  Age: 68 y.o. MRN: [id number]  CC:  Chief Complaint  Patient presents with   Medication Refill   Joint Swelling    Ankles    Medical Management of Chronic Issues    Wanting referral to another  Doctor     HPI [facility] presents for follow up of hypertension. Patient also reports some mild lower extremity swelling. Lastly, patient reports that he would like a referral to a different urologist for management of his prostate cancer and blood in his semen.   Outpatient Encounter Medications as of 10/06/2023  Medication Sig   amLODipine  (NORVASC ) 10 MG tablet TAKE 1 TABLET BY MOUTH EVERY DAY   losartan  (COZAAR ) 50 MG tablet Take 1 tablet (50 mg total) by mouth daily.   sildenafil  (VIAGRA ) 100 MG tablet Take 0.5-1 tablets (50-100 mg total) by mouth daily as needed for erectile dysfunction.   tamsulosin  (FLOMAX ) 0.4 MG CAPS capsule TAKE 1 CAPSULE BY MOUTH EVERY DAY AFTER SUPPER   No facility-administered encounter medications on file as of 10/06/2023.    Past Medical History:  Diagnosis Date   Arthritis    Asthma    As a child no problems now   Hallux rigidus of right foot    Hypertension    prostate cancer    Wears glasses     Past Surgical History:  Procedure Laterality Date   CALCANEAL OSTEOTOMY Right 05/04/2018   Procedure: MEDIAL CALCANEAL OSTEOTOMY;  Surgeon: Amada Backer, MD;  Location: [facility];  Service: Orthopedics;  Laterality: Right;   GASTROC RECESSION EXTREMITY Right 05/04/2018   Procedure: RIGHT GASTROC RECESSION;  Surgeon: Amada Backer, MD;  Location: [facility];  Service: Orthopedics;  Laterality: Right;   GOLD SEED IMPLANT N/A 09/15/2022   Procedure: GOLD SEED IMPLANT;  Surgeon: Samson Croak, MD;  Location: [facility];  Service: Urology;  Laterality: N/A;   HARDWARE REMOVAL Right  05/04/2018   Procedure: MEDIAL MALLEOLUS HARDWARE REMOVAL;  Surgeon: Amada Backer, MD;  Location: [facility];  Service: Orthopedics;  Laterality: Right;   KNEE SURGERY     arthroscopy right knee   ORIF ANKLE FRACTURE Right 11/08/2014   Procedure: OPEN REDUCTION INTERNAL FIXATION (ORIF) RIGHT ANKLE ;  Surgeon: Orvan Blanch, MD;  Location: [facility];  Service: Orthopedics;  Laterality: Right;   SPACE OAR INSTILLATION N/A 09/15/2022   Procedure: SPACE OAR INSTILLATION;  Surgeon: Samson Croak, MD;  Location: [facility];  Service: Urology;  Laterality: N/A;   TOTAL KNEE ARTHROPLASTY Right 04/23/2016   Procedure: RIGHT TOTAL KNEE ARTHROPLASTY;  Surgeon: Orvan Blanch, MD;  Location: [facility];  Service: Orthopedics;  Laterality: Right;   TOTAL KNEE ARTHROPLASTY Left 11/17/2022   Procedure: TOTAL KNEE ARTHROPLASTY;  Surgeon: Orvan Blanch, MD;  Location: [facility];  Service: Orthopedics;  Laterality: Left;    Family History  Problem Relation Age of Onset   Dementia Mother    Colon cancer Neg Hx     Social History   Socioeconomic History   Marital status: Married    Spouse name: Not on file   Number of children: Not on file   Years of education: Not on file   Highest education level: Associate degree: occupational, Scientist, product/process development, or vocational program  Occupational History   Not on file  Tobacco Use   Smoking status: Never  Passive exposure: Never   Smokeless tobacco: Never  Vaping Use   Vaping status: Never Used  Substance and Sexual Activity   Alcohol use: Yes    Comment: occasionally    Drug use: No   Sexual activity: Not on file  Other Topics Concern   Not on file  Social History Narrative   Not on file   Social Drivers of Health   Financial Resource Strain: Low Risk  (10/05/2023)   Overall Financial Resource Strain (CARDIA)    Difficulty of Paying Living Expenses: Not hard at all  Food Insecurity: No Food Insecurity (10/05/2023)   Hunger Vital  Sign    Worried About Running Out of Food in the Last Year: Never true    Ran Out of Food in the Last Year: Never true  Transportation Needs: No Transportation Needs (10/05/2023)   PRAPARE - Administrator, Civil Service (Medical): No    Lack of Transportation (Non-Medical): No  Physical Activity: Insufficiently Active (10/05/2023)   Exercise Vital Sign    Days of Exercise per Week: 3 days    Minutes of Exercise per Session: 40 min  Stress: Stress Concern Present (10/05/2023)   [employer] of Occupational Health - Occupational Stress Questionnaire    Feeling of Stress : To some extent  Social Connections: Unknown (10/05/2023)   Social Connection and Isolation Panel [NHANES]    Frequency of Communication with Friends and Family: More than three times a week    Frequency of Social Gatherings with Friends and Family: Twice a week    Attends Religious Services: Patient declined    Database administrator or Organizations: No    Attends Engineer, structural: Not on file    Marital Status: Married  Catering manager Violence: Not At Risk (11/17/2022)   Humiliation, Afraid, Rape, and Kick questionnaire    Fear of Current or Ex-Partner: No    Emotionally Abused: No    Physically Abused: No    Sexually Abused: No    Review of Systems  All other systems reviewed and are negative.       Objective    BP 128/83 (BP Location: Right Arm, Patient Position: Sitting, Cuff Size: Normal)   Pulse 95   Wt 239 lb 3.2 oz (108.5 kg)   SpO2 93%   BMI 29.90 kg/m   Physical Exam Vitals and nursing note reviewed.  Constitutional:      General: He is not in acute distress. Cardiovascular:     Rate and Rhythm: Normal rate and regular rhythm.  Pulmonary:     Effort: Pulmonary effort is normal.     Breath sounds: Normal breath sounds.  Abdominal:     Palpations: Abdomen is soft.     Tenderness: There is no abdominal tenderness.  Musculoskeletal:     Right lower leg: No  edema.     Left lower leg: No edema.  Neurological:     General: No focal deficit present.     Mental Status: He is alert and oriented to person, place, and time.         Assessment & Plan:   Essential hypertension  Lower extremity edema  Prostate cancer [facility]) -     Ambulatory referral to Urology  Blood in semen -     Ambulatory referral to Urology  Other orders -     Losartan  Potassium; Take 1 tablet (50 mg total) by mouth daily.  Dispense: 90 tablet; Refill: 0  Return in about 3 months (around 01/06/2024).   Arlo Lama, MD

## 2023-10-10 ENCOUNTER — Ambulatory Visit: Payer: Self-pay | Admitting: Family Medicine

## 2023-10-10 ENCOUNTER — Encounter: Payer: Self-pay | Admitting: Family Medicine

## 2023-11-09 ENCOUNTER — Encounter (HOSPITAL_BASED_OUTPATIENT_CLINIC_OR_DEPARTMENT_OTHER): Payer: Self-pay

## 2023-11-09 ENCOUNTER — Other Ambulatory Visit: Payer: Self-pay | Admitting: Family Medicine

## 2023-11-09 DIAGNOSIS — I10 Essential (primary) hypertension: Secondary | ICD-10-CM

## 2023-11-10 ENCOUNTER — Encounter: Payer: Self-pay | Admitting: Family Medicine

## 2023-11-10 ENCOUNTER — Ambulatory Visit (INDEPENDENT_AMBULATORY_CARE_PROVIDER_SITE_OTHER): Admitting: Family Medicine

## 2023-11-10 VITALS — BP 135/89 | HR 86 | Wt 238.6 lb

## 2023-11-10 DIAGNOSIS — I10 Essential (primary) hypertension: Secondary | ICD-10-CM

## 2023-11-10 DIAGNOSIS — R600 Localized edema: Secondary | ICD-10-CM | POA: Diagnosis not present

## 2023-11-10 NOTE — Progress Notes (Signed)
 Established Patient Office Visit  Subjective    Patient ID: Vincent Ray, male    DOB: [birth year]  Age: 68 y.o. MRN: [id number]  CC:  Chief Complaint  Patient presents with   Hypertension    HPI [facility] presents for follow up of hypertension. Patient reports that his lower extremtiy symptoms of swelling and pain have resolved since he is now off of amlodipine .   Outpatient Encounter Medications as of 11/10/2023  Medication Sig   losartan  (COZAAR ) 50 MG tablet Take 1 tablet (50 mg total) by mouth daily.   sildenafil  (VIAGRA ) 100 MG tablet Take 0.5-1 tablets (50-100 mg total) by mouth daily as needed for erectile dysfunction.   tamsulosin  (FLOMAX ) 0.4 MG CAPS capsule TAKE 1 CAPSULE BY MOUTH EVERY DAY AFTER SUPPER   [DISCONTINUED] amLODipine  (NORVASC ) 10 MG tablet TAKE 1 TABLET BY MOUTH EVERY DAY   No facility-administered encounter medications on file as of 11/10/2023.    Past Medical History:  Diagnosis Date   Arthritis    Asthma    As a child no problems now   Hallux rigidus of right foot    Hypertension    prostate cancer    Wears glasses     Past Surgical History:  Procedure Laterality Date   CALCANEAL OSTEOTOMY Right 05/04/2018   Procedure: MEDIAL CALCANEAL OSTEOTOMY;  Surgeon: Kit Rush, MD;  Location: [facility];  Service: Orthopedics;  Laterality: Right;   GASTROC RECESSION EXTREMITY Right 05/04/2018   Procedure: RIGHT GASTROC RECESSION;  Surgeon: Kit Rush, MD;  Location: [facility];  Service: Orthopedics;  Laterality: Right;   GOLD SEED IMPLANT N/A 09/15/2022   Procedure: GOLD SEED IMPLANT;  Surgeon: Carolee Sherwood JONETTA DOUGLAS, MD;  Location: [facility];  Service: Urology;  Laterality: N/A;   HARDWARE REMOVAL Right 05/04/2018   Procedure: MEDIAL MALLEOLUS HARDWARE REMOVAL;  Surgeon: Kit Rush, MD;  Location: [facility];  Service: Orthopedics;  Laterality: Right;   KNEE SURGERY      arthroscopy right knee   ORIF ANKLE FRACTURE Right 11/08/2014   Procedure: OPEN REDUCTION INTERNAL FIXATION (ORIF) RIGHT ANKLE ;  Surgeon: Reyes Billing, MD;  Location: [facility];  Service: Orthopedics;  Laterality: Right;   SPACE OAR INSTILLATION N/A 09/15/2022   Procedure: SPACE OAR INSTILLATION;  Surgeon: Carolee Sherwood JONETTA DOUGLAS, MD;  Location[facility];  Service: Urology;  Laterality: N/A;   TOTAL KNEE ARTHROPLASTY Right 04/23/2016   Procedure: RIGHT TOTAL KNEE ARTHROPLASTY;  Surgeon: Reyes Billing, MD;  Location: [facility];  Service: Orthopedics;  Laterality: Right;   TOTAL KNEE ARTHROPLASTY Left 11/17/2022   Procedure: TOTAL KNEE ARTHROPLASTY;  Surgeon: Billing Reyes, MD;  Location: [facility];  Service: Orthopedics;  Laterality: Left;    Family History  Problem Relation Age of Onset   Dementia Mother    Colon cancer Neg Hx     Social History   Socioeconomic History   Marital status: Married    Spouse name: Not on file   Number of children: Not on file   Years of education: Not on file   Highest education level: Associate degree: occupational, Scientist, product/process development, or vocational program  Occupational History   Not on file  Tobacco Use   Smoking status: Never    Passive exposure: Never   Smokeless tobacco: Never  Vaping Use   Vaping status: Never Used  Substance and Sexual Activity   Alcohol use: Yes    Comment: occasionally  Drug use: No   Sexual activity: Not on file  Other Topics Concern   Not on file  Social History Narrative   Not on file   Social Drivers of Health   Financial Resource Strain: Patient Declined (11/07/2023)   Overall Financial Resource Strain (CARDIA)    Difficulty of Paying Living Expenses: Patient declined  Food Insecurity: Patient Declined (11/07/2023)   Hunger Vital Sign    Worried About Running Out of Food in the Last Year: Patient declined    Ran Out of Food in the Last Year: Patient declined  Transportation Needs: Patient Declined  (11/07/2023)   PRAPARE - Administrator, Civil Service (Medical): Patient declined    Lack of Transportation (Non-Medical): Patient declined  Physical Activity: Insufficiently Active (11/07/2023)   Exercise Vital Sign    Days of Exercise per Week: 3 days    Minutes of Exercise per Session: 30 min  Stress: No Stress Concern Present (11/07/2023)   [employer] of Occupational Health - Occupational Stress Questionnaire    Feeling of Stress: Only a little  Recent Concern: Stress - Stress Concern Present (10/05/2023)   [employer] of Occupational Health - Occupational Stress Questionnaire    Feeling of Stress : To some extent  Social Connections: Socially Integrated (11/07/2023)   Social Connection and Isolation Panel    Frequency of Communication with Friends and Family: Three times a week    Frequency of Social Gatherings with Friends and Family: Twice a week    Attends Religious Services: 1 to 4 times per year    Active Member of [employer] or Organizations: Yes    Attends Banker Meetings: 1 to 4 times per year    Marital Status: Married  Catering manager Violence: Not At Risk (11/17/2022)   Humiliation, Afraid, Rape, and Kick questionnaire    Fear of Current or Ex-Partner: No    Emotionally Abused: No    Physically Abused: No    Sexually Abused: No    Review of Systems  All other systems reviewed and are negative.       Objective    BP 135/89 (BP Location: Left Arm, Patient Position: Sitting, Cuff Size: Normal)   Pulse 86   Wt 238 lb 9.6 oz (108.2 kg)   SpO2 91%   BMI 29.82 kg/m   Physical Exam Vitals and nursing note reviewed.  Constitutional:      General: He is not in acute distress.  Cardiovascular:     Rate and Rhythm: Normal rate and regular rhythm.  Pulmonary:     Effort: Pulmonary effort is normal.     Breath sounds: Normal breath sounds.  Abdominal:     Palpations: Abdomen is soft.     Tenderness: There is no abdominal  tenderness.   Musculoskeletal:     Right lower leg: No edema.     Left lower leg: No edema.   Neurological:     General: No focal deficit present.     Mental Status: He is alert and oriented to person, place, and time.         Assessment & Plan:   1. Essential hypertension (Primary) Appears stable on new med. Continue   2. Lower extremity edema Improved/resolved    Return in about 3 months (around 02/10/2024) for follow up.   Tanda Raguel SQUIBB, MD

## 2023-11-11 ENCOUNTER — Encounter: Payer: Self-pay | Admitting: Family Medicine

## 2023-12-12 DIAGNOSIS — N400 Enlarged prostate without lower urinary tract symptoms: Secondary | ICD-10-CM | POA: Diagnosis not present

## 2023-12-12 DIAGNOSIS — R310 Gross hematuria: Secondary | ICD-10-CM | POA: Diagnosis not present

## 2023-12-12 DIAGNOSIS — C61 Malignant neoplasm of prostate: Secondary | ICD-10-CM | POA: Diagnosis not present

## 2023-12-12 NOTE — Progress Notes (Addendum)
 Vincent Nam, MD 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  PCP:  Vincent Elsie Rush, MD  Chief Complaint  Patient presents with  . Benign Prostatic Hypertrophy    HPI:  68 y.o. male seen in consultation at the request of No referring provider defined for this encounter.  The patient has had concerns including Benign Prostatic Hypertrophy.  68 year old male who comes in to [facility] care from [facility].  He has previously been under the care of Vincent Ray for prostate cancer.  I do not have his old medical records or urology records available to me.  By his report, he was diagnosed with prostate cancer December 2023 with a Gleason 7.  He does not remember his pretreatment PSA.  He was treated with external beam radiation therapy which was completed in June 2024.  He does not recall any androgen deprivation therapy.  He does not know his posttreatment PSA.  He has had some problems with occasional gross hematuria with clots as well as hematospermia both of which have cleared spontaneously.  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  He said that he was recommended hyperbaric oxygen.   He also is having some anxiety regarding sexual relations as it relates to the hematospermia.  He is having some slow urination and takes 1 or 2 Flomax  per day as well as oxybutynin.  Pvr 12ml No orders to display    LAB RESULTS REVIEWED: PSA levels: No results found for: PSA  Serum testosterone levels: No results found for: TESTOSTERONE  Urine culture: No results found for: LABURIN  BMP: No results found for:  NA, K, CALCIUM, CL, GLU, CO2, BUN, CREATININE, PROT, ALBUMIN, BILITOT  CBC: No results found for: WBC, RBC, HGB, HCT, PLT   PMH:  Medical History[1]   PSH: Surgical History[2]     Medications: Current Medications[3]  Allergies: Patient has no allergy information on record.   Social History: Patient  reports that he has never smoked. He has never used smokeless tobacco. He reports that he does not drink alcohol.   Family History: The patient's family history is not on file.   Review of Systems: General: No fever, chills, weight changes Skin: No rashes, lesions, wounds Eyes: no discharge, redness, pain HENT: no ear pain, hearing loss, drainage, tinnitus Endocrine: no heat/cold intolerance, no polyuria Respiratory: No cough, wheezes, shortness of breath Cardiovascular: No palpitations, chest pain GI: No nausea, vomiting, diarrhea, constipation GU: No dysuria, increased frequency CNS: No numbness, dizziness, headache Musculoskeletal: No back pain, joint pain Blood/lymphatics: No easy bruising, bleeding Mood/affect: No anxiety/depression  Otherwise full 14 point review of systems performed by me is negative unless mentioned in the HPI    Physical Exam: BP 147/89 (BP Location: Right arm, Patient Position: Sitting)   Pulse 83   Temp 97.5 F (36.4 C) (Temporal)   Resp 14  GENERAL APPEARANCE:  Well appearing, well developed, well nourished, NAD HEENT: Atraumatic, Normocephalic NECK: Supple without lymphadenopathy or masses. LUNGS: Clear to auscultation bilaterally, Normal  respiratory effort HEART: no obvious cyanosis ABDOMEN: Soft, non-tender, No Masses. EXTREMITIES: Moves all extremities well.  Without edema. NEUROLOGIC:  Alert and oriented x 3,  CN II-XII grossly intact.  MENTAL STATUS:  Appropriate. BACK:  Non-tender to palpation.  No CVAT SKIN:  Warm, dry and intact.   GENT: Penis scrotum and testicles are normal.  Prostate  flat and indurated.  Results:   Urinalysis and laboratory results, if present, reviewed and analyzed. Recent Results (from the past 24 hours)  Urinalysis with Reflex to Microscopic   Collection Time: 12/12/23  9:39 AM  Result Value Ref Range   Color, Urine Amber (A) Yellow   Clarity, Urine Clear Clear   Specific Gravity, Urine >=1.030 1.002 - 1.030   pH, Urine 6.0 5.0 - 8.0   Protein, Urine Trace Negative, Trace mg/dL   Glucose, Urine Negative Negative mg/dL   Ketones, Urine Negative Negative mg/dL   Bilirubin, Urine Negative Negative   Blood, Urine 2+ (A) Negative   Nitrite, Urine Negative Negative   Leukocyte Esterase, Urine Negative Negative   Urobilinogen, Urine 0.2 <2.0 mg/dL  Urinalysis, Microscopic Only   Collection Time: 12/12/23  9:39 AM  Result Value Ref Range   WBC, Urine 16-30 (A) <3 /HPF   RBC, Urine 9-15 (A) <3 /HPF   Bacteria, Urine None Seen None Seen /HPF            [1] No past medical history on file. [2] Past Surgical History: Procedure Laterality Date  . ANKLE SURGERY     Procedure: ANKLE SURGERY  . KNEE SURGERY     Procedure: KNEE SURGERY  [3] Current Outpatient Medications  Medication Sig Dispense Refill  . amoxicillin -pot clavulanate (AUGMENTIN ) 875-125 mg per tablet Take 1 tablet by mouth.    . aspirin  81 mg EC tablet Take  by mouth.     No current facility-administered medications for this visit.

## 2023-12-13 ENCOUNTER — Ambulatory Visit (INDEPENDENT_AMBULATORY_CARE_PROVIDER_SITE_OTHER)

## 2023-12-13 VITALS — BP 128/87 | HR 92 | Temp 98.3°F | Resp 16 | Ht 75.0 in | Wt 237.0 lb

## 2023-12-13 DIAGNOSIS — I10 Essential (primary) hypertension: Secondary | ICD-10-CM | POA: Diagnosis not present

## 2023-12-13 MED ORDER — HYDROCHLOROTHIAZIDE 25 MG PO TABS: 25.0000 mg | ORAL_TABLET | Freq: Every day | ORAL | 3 refills | Status: AC

## 2023-12-13 NOTE — Progress Notes (Signed)
 Patient ID: Vincent Ray, male    DOB: 02/09/1956  MRN: [id number]  CC: Medical Management of Chronic Issues   Subjective: Vincent Ray is a 68 y.o. male with past medical history of hypertension,  who presents to clinic with chief complaint of elevated blood pressure readings. Associated symptoms include 1 week history of head pressure, slight ring in his ears and minor fatigue. Previous BP reading at home were 138/79 and 135/85. Reports the majority of his BP readings at home ar in the high 130s systolic but he has not experienced this symptoms before. Reports he has been spending a lot more time outdoors, has history of seasonal allergies. Occasionally eats high sodium food such as potato chips. Denies chest pain, shortness of breath or leg swelling.    Patient Active Problem List   Diagnosis Date Noted   Primary hypertension 12/13/2023   DJD (degenerative joint disease) of knee 11/17/2022   Malignant neoplasm of prostate (HCC) 07/30/2022   Acquired hallux rigidus of right foot 01/30/2018   Primary osteoarthritis of right knee 04/23/2016   Preventative health care 05/28/2015   Nonspecific abnormal electrocardiogram (ECG) (EKG) 05/28/2015   Bimalleolar fracture of right ankle 11/08/2014   Ankle fracture, bimalleolar, closed 11/08/2014   DEPRESSION 05/19/2007   ALLERGIC RHINITIS 05/19/2007   ASTHMA 05/19/2007   ANKLE PAIN, LEFT 05/19/2007     Current Outpatient Medications on File Prior to Visit  Medication Sig Dispense Refill   losartan  (COZAAR ) 50 MG tablet Take 1 tablet (50 mg total) by mouth daily. 90 tablet 0   oxybutynin (DITROPAN) 5 MG tablet Take 5 mg by mouth daily as needed.     tamsulosin  (FLOMAX ) 0.4 MG CAPS capsule TAKE 1 CAPSULE BY MOUTH EVERY DAY AFTER SUPPER 90 capsule 2   HYDROmorphone  (DILAUDID ) 2 MG tablet TAKE 1 TABLET AS NEEDED BY ORAL ROUTE AT BEDTIME FOR 10 DAYS. (Patient not taking: Reported on 12/13/2023)     sildenafil  (VIAGRA ) 100 MG  tablet Take 0.5-1 tablets (50-100 mg total) by mouth daily as needed for erectile dysfunction. (Patient not taking: Reported on 12/13/2023) 5 tablet 0   tadalafil (CIALIS) 5 MG tablet Take 1 tablet by mouth daily. (Patient not taking: Reported on 12/13/2023)     No current facility-administered medications on file prior to visit.    No Known Allergies   ROS: Review of Systems  Constitutional:  Negative for diaphoresis.  HENT:  Negative for ear pain and hearing loss.   Eyes:  Negative for photophobia and pain.  Respiratory:  Negative for chest tightness and shortness of breath.   Cardiovascular:  Negative for chest pain, palpitations and leg swelling.  Musculoskeletal:  Negative for neck pain.  Neurological:  Positive for headaches.   Negative except as stated above  PHYSICAL EXAM: BP 128/87 (BP Location: Left Arm)   Pulse 92   Temp 98.3 F (36.8 C) (Oral)   Resp 16   Ht 6' 3 (1.905 m)   Wt 237 lb (107.5 kg)   SpO2 94%   BMI 29.62 kg/m   Physical Exam  General appearance - alert, well appearing, and in no distress Ears - bilateral TM's and external ear canals normal Chest - clear to auscultation, no wheezes, rales or rhonchi, symmetric air entry Heart - normal rate, regular rhythm, normal S1, S2, no murmurs, rubs, clicks or gallops Extremities - peripheral pulses normal, no pedal edema, no clubbing or cyanosis   ASSESSMENT AND PLAN:  1. Primary hypertension (Primary) - Increased head  pressure could be caused by elevated blood pressure readings but since his normal BP is at the high 130s, this is unlikely. Other likely causes are seasonal allergies, heat exposure, or mod-intense activities. - Counseling provided to maintain a low sodium diet and to adhere to medication regimen for optimum results - Adding Hydrochlorothiazide  25mg  once daily for improved blood pressure control. Pt educated on possible side effects and mechanism of medication. Medication changes were  discussed with PCP Dr. Tanda in office.     Patient was given the opportunity to ask questions.  Patient verbalized understanding of the plan and was able to repeat key elements of the plan.      Requested Prescriptions   Signed Prescriptions Disp Refills   hydrochlorothiazide  (HYDRODIURIL ) 25 MG tablet 30 tablet 3    Sig: Take 1 tablet (25 mg total) by mouth daily.    No follow-ups on file.  Sula Leavy Rode, PA-C

## 2023-12-29 DIAGNOSIS — D494 Neoplasm of unspecified behavior of bladder: Secondary | ICD-10-CM | POA: Diagnosis not present

## 2023-12-29 DIAGNOSIS — K769 Liver disease, unspecified: Secondary | ICD-10-CM | POA: Diagnosis not present

## 2023-12-29 DIAGNOSIS — C61 Malignant neoplasm of prostate: Secondary | ICD-10-CM | POA: Diagnosis not present

## 2023-12-29 DIAGNOSIS — N3289 Other specified disorders of bladder: Secondary | ICD-10-CM | POA: Diagnosis not present

## 2023-12-29 DIAGNOSIS — N138 Other obstructive and reflux uropathy: Secondary | ICD-10-CM | POA: Diagnosis not present

## 2023-12-29 DIAGNOSIS — N368 Other specified disorders of urethra: Secondary | ICD-10-CM | POA: Diagnosis not present

## 2023-12-29 DIAGNOSIS — R310 Gross hematuria: Secondary | ICD-10-CM | POA: Diagnosis not present

## 2023-12-29 DIAGNOSIS — N401 Enlarged prostate with lower urinary tract symptoms: Secondary | ICD-10-CM | POA: Diagnosis not present

## 2023-12-29 DIAGNOSIS — K5730 Diverticulosis of large intestine without perforation or abscess without bleeding: Secondary | ICD-10-CM | POA: Diagnosis not present

## 2024-01-01 ENCOUNTER — Other Ambulatory Visit: Payer: Self-pay | Admitting: Family Medicine

## 2024-01-04 DIAGNOSIS — C61 Malignant neoplasm of prostate: Secondary | ICD-10-CM | POA: Diagnosis not present

## 2024-01-04 DIAGNOSIS — K5730 Diverticulosis of large intestine without perforation or abscess without bleeding: Secondary | ICD-10-CM | POA: Diagnosis not present

## 2024-01-04 DIAGNOSIS — K769 Liver disease, unspecified: Secondary | ICD-10-CM | POA: Diagnosis not present

## 2024-01-04 DIAGNOSIS — N281 Cyst of kidney, acquired: Secondary | ICD-10-CM | POA: Diagnosis not present

## 2024-02-06 DIAGNOSIS — Z01810 Encounter for preprocedural cardiovascular examination: Secondary | ICD-10-CM | POA: Diagnosis not present

## 2024-02-09 DIAGNOSIS — N4289 Other specified disorders of prostate: Secondary | ICD-10-CM | POA: Diagnosis not present

## 2024-02-09 DIAGNOSIS — N400 Enlarged prostate without lower urinary tract symptoms: Secondary | ICD-10-CM | POA: Diagnosis not present

## 2024-02-09 DIAGNOSIS — Z8546 Personal history of malignant neoplasm of prostate: Secondary | ICD-10-CM | POA: Diagnosis not present

## 2024-02-09 DIAGNOSIS — N410 Acute prostatitis: Secondary | ICD-10-CM | POA: Diagnosis not present

## 2024-02-09 DIAGNOSIS — N365 Urethral false passage: Secondary | ICD-10-CM | POA: Diagnosis not present

## 2024-02-09 DIAGNOSIS — Z923 Personal history of irradiation: Secondary | ICD-10-CM | POA: Diagnosis not present

## 2024-02-10 DIAGNOSIS — C61 Malignant neoplasm of prostate: Secondary | ICD-10-CM | POA: Diagnosis not present

## 2024-02-13 ENCOUNTER — Encounter: Payer: Self-pay | Admitting: Family Medicine

## 2024-02-13 ENCOUNTER — Ambulatory Visit (INDEPENDENT_AMBULATORY_CARE_PROVIDER_SITE_OTHER): Admitting: Family Medicine

## 2024-02-13 VITALS — BP 119/80 | HR 80 | Ht 75.0 in | Wt 238.8 lb

## 2024-02-13 DIAGNOSIS — R361 Hematospermia: Secondary | ICD-10-CM

## 2024-02-13 DIAGNOSIS — I10 Essential (primary) hypertension: Secondary | ICD-10-CM

## 2024-02-13 NOTE — Progress Notes (Unsigned)
 Established Patient Office Visit  Subjective    Patient ID: Vincent Ray, male    DOB: Jul 16, 1955  Age: 68 y.o. MRN: [id number]  CC:  Chief Complaint  Patient presents with   Medical Management of Chronic Issues    HPI Vincent Ray presents to update regarding his recent health concerns. He reports that his symptoms regarding his blood pressure concerns resolved when he changed his glasses. He has had not additional problems. He also followed up with consultant and was made aware that the blood in his semen was related to the treatment for prostate cancer. He denies acute complaints.   Outpatient Encounter Medications as of 02/13/2024  Medication Sig   hydrochlorothiazide  (HYDRODIURIL ) 25 MG tablet Take 1 tablet (25 mg total) by mouth daily.   losartan  (COZAAR ) 50 MG tablet TAKE 1 TABLET BY MOUTH EVERY DAY   tamsulosin  (FLOMAX ) 0.4 MG CAPS capsule TAKE 1 CAPSULE BY MOUTH EVERY DAY AFTER SUPPER   HYDROmorphone  (DILAUDID ) 2 MG tablet TAKE 1 TABLET AS NEEDED BY ORAL ROUTE AT BEDTIME FOR 10 DAYS. (Patient not taking: Reported on 12/13/2023)   oxybutynin (DITROPAN) 5 MG tablet Take 5 mg by mouth daily as needed. (Patient not taking: Reported on 02/13/2024)   sildenafil  (VIAGRA ) 100 MG tablet Take 0.5-1 tablets (50-100 mg total) by mouth daily as needed for erectile dysfunction. (Patient not taking: Reported on 12/13/2023)   tadalafil (CIALIS) 5 MG tablet Take 1 tablet by mouth daily. (Patient not taking: Reported on 12/13/2023)   No facility-administered encounter medications on file as of 02/13/2024.    Past Medical History:  Diagnosis Date   Arthritis    Asthma    As a child no problems now   Hallux rigidus of right foot    Hypertension    prostate cancer    Wears glasses     Past Surgical History:  Procedure Laterality Date   CALCANEAL OSTEOTOMY Right 05/04/2018   Procedure: MEDIAL CALCANEAL OSTEOTOMY;  Surgeon: Kit Rush, MD;  Location: [facility];  Service: Orthopedics;  Laterality: Right;   GASTROC RECESSION EXTREMITY Right 05/04/2018   Procedure: RIGHT GASTROC RECESSION;  Surgeon: Kit Rush, MD;  Location: [facility];  Service: Orthopedics;  Laterality: Right;   GOLD SEED IMPLANT N/A 09/15/2022   Procedure: GOLD SEED IMPLANT;  Surgeon: Carolee Sherwood JONETTA DOUGLAS, MD;  Location: [facility];  Service: Urology;  Laterality: N/A;   HARDWARE REMOVAL Right 05/04/2018   Procedure: MEDIAL MALLEOLUS HARDWARE REMOVAL;  Surgeon: Kit Rush, MD;  Location: [facility];  Service: Orthopedics;  Laterality: Right;   KNEE SURGERY     arthroscopy right knee   ORIF ANKLE FRACTURE Right 11/08/2014   Procedure: OPEN REDUCTION INTERNAL FIXATION (ORIF) RIGHT ANKLE ;  Surgeon: Reyes Billing, MD;  Location: [facility];  Service: Orthopedics;  Laterality: Right;   SPACE OAR INSTILLATION N/A 09/15/2022   Procedure: SPACE OAR INSTILLATION;  Surgeon: Carolee Sherwood JONETTA DOUGLAS, MD;  Location: [facility];  Service: Urology;  Laterality: N/A;   TOTAL KNEE ARTHROPLASTY Right 04/23/2016   Procedure: RIGHT TOTAL KNEE ARTHROPLASTY;  Surgeon: Reyes Billing, MD;  Location: [facility];  Service: Orthopedics;  Laterality: Right;   TOTAL KNEE ARTHROPLASTY Left 11/17/2022   Procedure: TOTAL KNEE ARTHROPLASTY;  Surgeon: Billing Reyes, MD;  Location: [facility];  Service: Orthopedics;  Laterality: Left;    Family History  Problem Relation Age of Onset   Dementia Mother    Colon cancer  Neg Hx     Social History   Socioeconomic History   Marital status: Married    Spouse name: Not on file   Number of children: Not on file   Years of education: Not on file   Highest education level: Associate degree: occupational, Scientist, product/process development, or vocational program  Occupational History   Not on file  Tobacco Use   Smoking status: Never    Passive exposure: Never   Smokeless tobacco: Never  Vaping Use   Vaping status: Never Used   Substance and Sexual Activity   Alcohol use: Yes    Comment: occasionally    Drug use: No   Sexual activity: Not on file  Other Topics Concern   Not on file  Social History Narrative   Not on file   Social Drivers of Health   Financial Resource Strain: Patient Declined (11/07/2023)   Overall Financial Resource Strain (CARDIA)    Difficulty of Paying Living Expenses: Patient declined  Food Insecurity: Low Risk  (02/09/2024)   Received from [facility]   Hunger Vital Sign    Within the past 12 months, you worried that your food would run out before you got money to buy more: Never true    Within the past 12 months, the food you bought just didn't last and you didn't have money to get more. : Never true  Transportation Needs: No Transportation Needs (02/09/2024)   Received from [facility]    In the past 12 months, has lack of reliable transportation kept you from medical appointments, meetings, work or from getting things needed for daily living? : No  Physical Activity: Insufficiently Active (11/07/2023)   Exercise Vital Sign    Days of Exercise per Week: 3 days    Minutes of Exercise per Session: 30 min  Stress: No Stress Concern Present (11/07/2023)   [employer] of Occupational Health - Occupational Stress Questionnaire    Feeling of Stress: Only a little  Recent Concern: Stress - Stress Concern Present (10/05/2023)   [employer] of Occupational Health - Occupational Stress Questionnaire    Feeling of Stress : To some extent  Social Connections: Socially Integrated (11/07/2023)   Social Connection and Isolation Panel    Frequency of Communication with Friends and Family: Three times a week    Frequency of Social Gatherings with Friends and Family: Twice a week    Attends Religious Services: 1 to 4 times per year    Active Member of [employer] or Organizations: Yes    Attends Banker Meetings: 1 to 4 times per year    Marital Status:  Married  Catering manager Violence: Not At Risk (11/17/2022)   Humiliation, Afraid, Rape, and Kick questionnaire    Fear of Current or Ex-Partner: No    Emotionally Abused: No    Physically Abused: No    Sexually Abused: No    Review of Systems  All other systems reviewed and are negative.       Objective    BP 119/80   Pulse 80   Ht 6' 3 (1.905 m)   Wt 238 lb 12.8 oz (108.3 kg)   SpO2 91%   BMI 29.85 kg/m   Physical Exam Vitals and nursing note reviewed.  Constitutional:      General: He is not in acute distress. Cardiovascular:     Rate and Rhythm: Normal rate and regular rhythm.  Pulmonary:     Effort: Pulmonary effort is normal.  Breath sounds: Normal breath sounds.  Abdominal:     Palpations: Abdomen is soft.     Tenderness: There is no abdominal tenderness.  Neurological:     General: No focal deficit present.     Mental Status: He is alert and oriented to person, place, and time.         Assessment & Plan:   1. Essential hypertension (Primary) Previous sx resolved. Appears stable. Continue   2. Blood in semen [employer] as per Research scientist (medical).    Return if symptoms worsen or fail to improve.   Tanda Raguel SQUIBB, MD

## 2024-02-15 ENCOUNTER — Encounter: Payer: Self-pay | Admitting: Family Medicine

## 2024-02-23 DIAGNOSIS — Z48816 Encounter for surgical aftercare following surgery on the genitourinary system: Secondary | ICD-10-CM | POA: Diagnosis not present

## 2024-02-23 DIAGNOSIS — D494 Neoplasm of unspecified behavior of bladder: Secondary | ICD-10-CM | POA: Diagnosis not present

## 2024-04-07 ENCOUNTER — Other Ambulatory Visit: Payer: Self-pay

## 2024-04-12 ENCOUNTER — Other Ambulatory Visit: Payer: Self-pay | Admitting: Radiation Oncology

## 2024-04-17 ENCOUNTER — Other Ambulatory Visit: Payer: Self-pay | Admitting: Family Medicine

## 2024-04-23 DIAGNOSIS — M67961 Unspecified disorder of synovium and tendon, right lower leg: Secondary | ICD-10-CM | POA: Diagnosis not present

## 2024-04-23 DIAGNOSIS — M25471 Effusion, right ankle: Secondary | ICD-10-CM | POA: Diagnosis not present

## 2024-07-05 ENCOUNTER — Ambulatory Visit

## 2024-08-16 ENCOUNTER — Ambulatory Visit

## 2025-02-12 ENCOUNTER — Encounter: Admitting: Family Medicine
